# Patient Record
Sex: Male | Born: 1940
Health system: Southern US, Community
[De-identification: ages and names within clinical notes are randomized; demographics above are authoritative.]

## PROBLEM LIST (undated history)

## (undated) DIAGNOSIS — G4730 Sleep apnea, unspecified: Secondary | ICD-10-CM

## (undated) DIAGNOSIS — I10 Essential (primary) hypertension: Secondary | ICD-10-CM

## (undated) DIAGNOSIS — K4020 Bilateral inguinal hernia, without obstruction or gangrene, not specified as recurrent: Secondary | ICD-10-CM

## (undated) DIAGNOSIS — Z87442 Personal history of urinary calculi: Secondary | ICD-10-CM

## (undated) DIAGNOSIS — N5089 Other specified disorders of the male genital organs: Secondary | ICD-10-CM

## (undated) DIAGNOSIS — E559 Vitamin D deficiency, unspecified: Secondary | ICD-10-CM

## (undated) DIAGNOSIS — T7840XA Allergy, unspecified, initial encounter: Secondary | ICD-10-CM

## (undated) DIAGNOSIS — E785 Hyperlipidemia, unspecified: Secondary | ICD-10-CM

## (undated) DIAGNOSIS — D649 Anemia, unspecified: Secondary | ICD-10-CM

## (undated) DIAGNOSIS — R079 Chest pain, unspecified: Secondary | ICD-10-CM

## (undated) DIAGNOSIS — E079 Disorder of thyroid, unspecified: Secondary | ICD-10-CM

## (undated) DIAGNOSIS — J309 Allergic rhinitis, unspecified: Secondary | ICD-10-CM

## (undated) DIAGNOSIS — N189 Chronic kidney disease, unspecified: Secondary | ICD-10-CM

## (undated) DIAGNOSIS — E349 Endocrine disorder, unspecified: Secondary | ICD-10-CM

## (undated) DIAGNOSIS — L723 Sebaceous cyst: Secondary | ICD-10-CM

## (undated) DIAGNOSIS — E291 Testicular hypofunction: Secondary | ICD-10-CM

## (undated) DIAGNOSIS — E039 Hypothyroidism, unspecified: Secondary | ICD-10-CM

## (undated) DIAGNOSIS — N200 Calculus of kidney: Secondary | ICD-10-CM

## (undated) DIAGNOSIS — K635 Polyp of colon: Secondary | ICD-10-CM

## (undated) DIAGNOSIS — K602 Anal fissure, unspecified: Secondary | ICD-10-CM

## (undated) DIAGNOSIS — K219 Gastro-esophageal reflux disease without esophagitis: Secondary | ICD-10-CM

## (undated) HISTORY — DX: Disorder of thyroid, unspecified: E07.9

## (undated) HISTORY — DX: Essential (primary) hypertension: I10

## (undated) HISTORY — DX: Sebaceous cyst: L72.3

## (undated) HISTORY — DX: Vitamin D deficiency, unspecified: E55.9

## (undated) HISTORY — DX: Bilateral inguinal hernia, without obstruction or gangrene, not specified as recurrent: K40.20

## (undated) HISTORY — PX: FIBULA FRACTURE SURGERY: SHX947

## (undated) HISTORY — DX: Anal fissure, unspecified: K60.2

## (undated) HISTORY — DX: Other specified disorders of the male genital organs: N50.89

## (undated) HISTORY — DX: Chest pain, unspecified: R07.9

## (undated) HISTORY — DX: Polyp of colon: K63.5

## (undated) HISTORY — DX: Testicular hypofunction: E29.1

## (undated) HISTORY — PX: COLONOSCOPY: SHX174

## (undated) HISTORY — PX: POLYPECTOMY: SHX149

## (undated) HISTORY — PX: FRACTURE SURGERY: SHX138

## (undated) HISTORY — DX: Anemia, unspecified: D64.9

## (undated) HISTORY — DX: Hyperlipidemia, unspecified: E78.5

## (undated) HISTORY — DX: Allergy, unspecified, initial encounter: T78.40XA

## (undated) HISTORY — PX: ANAL FISSURE REPAIR: SHX2312

## (undated) HISTORY — DX: Allergic rhinitis, unspecified: J30.9

## (undated) HISTORY — PX: HEMORRHOID SURGERY: SHX153

## (undated) HISTORY — PX: UPPER GASTROINTESTINAL ENDOSCOPY: SHX188

## (undated) HISTORY — PX: CHOLECYSTECTOMY: SHX55

## (undated) HISTORY — DX: Gastro-esophageal reflux disease without esophagitis: K21.9

## (undated) HISTORY — DX: Calculus of kidney: N20.0

## (undated) HISTORY — PX: OTHER SURGICAL HISTORY: SHX169

---

## 1999-10-19 ENCOUNTER — Encounter (INDEPENDENT_AMBULATORY_CARE_PROVIDER_SITE_OTHER): Payer: Self-pay | Admitting: Specialist

## 1999-10-19 ENCOUNTER — Other Ambulatory Visit: Admission: RE | Admit: 1999-10-19 | Discharge: 1999-10-19 | Payer: Self-pay | Admitting: Urology

## 2002-05-11 ENCOUNTER — Ambulatory Visit (HOSPITAL_BASED_OUTPATIENT_CLINIC_OR_DEPARTMENT_OTHER): Admission: RE | Admit: 2002-05-11 | Discharge: 2002-05-11 | Payer: Self-pay | Admitting: Urology

## 2002-05-11 ENCOUNTER — Encounter (INDEPENDENT_AMBULATORY_CARE_PROVIDER_SITE_OTHER): Payer: Self-pay

## 2003-03-28 ENCOUNTER — Encounter: Payer: Self-pay | Admitting: Gastroenterology

## 2003-05-04 ENCOUNTER — Encounter: Payer: Self-pay | Admitting: Gastroenterology

## 2003-12-23 ENCOUNTER — Encounter (INDEPENDENT_AMBULATORY_CARE_PROVIDER_SITE_OTHER): Payer: Self-pay | Admitting: Specialist

## 2003-12-23 ENCOUNTER — Encounter (INDEPENDENT_AMBULATORY_CARE_PROVIDER_SITE_OTHER): Payer: Self-pay | Admitting: *Deleted

## 2003-12-23 ENCOUNTER — Ambulatory Visit (HOSPITAL_COMMUNITY): Admission: RE | Admit: 2003-12-23 | Discharge: 2003-12-23 | Payer: Self-pay | Admitting: General Surgery

## 2005-04-26 ENCOUNTER — Encounter: Admission: RE | Admit: 2005-04-26 | Discharge: 2005-04-26 | Payer: Self-pay | Admitting: Internal Medicine

## 2005-06-23 ENCOUNTER — Emergency Department (HOSPITAL_COMMUNITY): Admission: EM | Admit: 2005-06-23 | Discharge: 2005-06-23 | Payer: Self-pay | Admitting: Emergency Medicine

## 2005-07-02 ENCOUNTER — Inpatient Hospital Stay (HOSPITAL_COMMUNITY): Admission: RE | Admit: 2005-07-02 | Discharge: 2005-07-04 | Payer: Self-pay | Admitting: Orthopedic Surgery

## 2005-10-08 ENCOUNTER — Inpatient Hospital Stay (HOSPITAL_COMMUNITY): Admission: RE | Admit: 2005-10-08 | Discharge: 2005-10-11 | Payer: Self-pay | Admitting: Orthopedic Surgery

## 2005-10-09 ENCOUNTER — Encounter: Payer: Self-pay | Admitting: Vascular Surgery

## 2006-04-23 ENCOUNTER — Encounter: Admission: RE | Admit: 2006-04-23 | Discharge: 2006-04-23 | Payer: Self-pay | Admitting: Sports Medicine

## 2007-04-29 ENCOUNTER — Encounter (INDEPENDENT_AMBULATORY_CARE_PROVIDER_SITE_OTHER): Payer: Self-pay | Admitting: *Deleted

## 2007-04-29 ENCOUNTER — Ambulatory Visit (HOSPITAL_BASED_OUTPATIENT_CLINIC_OR_DEPARTMENT_OTHER): Admission: RE | Admit: 2007-04-29 | Discharge: 2007-04-29 | Payer: Self-pay | Admitting: General Surgery

## 2009-05-19 ENCOUNTER — Encounter (INDEPENDENT_AMBULATORY_CARE_PROVIDER_SITE_OTHER): Payer: Self-pay | Admitting: *Deleted

## 2009-06-21 ENCOUNTER — Encounter (INDEPENDENT_AMBULATORY_CARE_PROVIDER_SITE_OTHER): Payer: Self-pay | Admitting: *Deleted

## 2009-06-22 ENCOUNTER — Ambulatory Visit: Payer: Self-pay | Admitting: Gastroenterology

## 2009-07-06 ENCOUNTER — Ambulatory Visit: Payer: Self-pay | Admitting: Gastroenterology

## 2010-01-03 ENCOUNTER — Encounter: Payer: Self-pay | Admitting: Gastroenterology

## 2010-01-10 ENCOUNTER — Telehealth: Payer: Self-pay | Admitting: Gastroenterology

## 2010-02-05 ENCOUNTER — Encounter: Payer: Self-pay | Admitting: Gastroenterology

## 2010-03-12 ENCOUNTER — Encounter
Admission: RE | Admit: 2010-03-12 | Discharge: 2010-03-12 | Payer: Self-pay | Source: Home / Self Care | Attending: Internal Medicine | Admitting: Internal Medicine

## 2010-04-04 ENCOUNTER — Ambulatory Visit
Admission: RE | Admit: 2010-04-04 | Discharge: 2010-04-04 | Payer: Self-pay | Source: Home / Self Care | Attending: Gastroenterology | Admitting: Gastroenterology

## 2010-04-04 ENCOUNTER — Encounter: Payer: Self-pay | Admitting: Gastroenterology

## 2010-04-04 ENCOUNTER — Other Ambulatory Visit: Payer: Self-pay | Admitting: Gastroenterology

## 2010-04-04 DIAGNOSIS — D509 Iron deficiency anemia, unspecified: Secondary | ICD-10-CM | POA: Insufficient documentation

## 2010-04-04 DIAGNOSIS — K219 Gastro-esophageal reflux disease without esophagitis: Secondary | ICD-10-CM | POA: Insufficient documentation

## 2010-04-04 LAB — IGA: IgA: 266 mg/dL (ref 68–378)

## 2010-04-17 ENCOUNTER — Other Ambulatory Visit: Payer: Self-pay | Admitting: Gastroenterology

## 2010-04-17 ENCOUNTER — Ambulatory Visit
Admission: RE | Admit: 2010-04-17 | Discharge: 2010-04-17 | Payer: Self-pay | Source: Home / Self Care | Attending: Gastroenterology | Admitting: Gastroenterology

## 2010-04-17 DIAGNOSIS — K2980 Duodenitis without bleeding: Secondary | ICD-10-CM

## 2010-04-17 DIAGNOSIS — K2940 Chronic atrophic gastritis without bleeding: Secondary | ICD-10-CM

## 2010-04-24 ENCOUNTER — Encounter: Payer: Self-pay | Admitting: Gastroenterology

## 2010-04-24 NOTE — Procedures (Signed)
Summary: Colonoscopy  Patient: [name] Note: All result statuses are Final unless otherwise noted.  Tests: (1) Colonoscopy (COL)   COL Colonoscopy           DONE     [facility]     [address]     [place], [place]  [address]           COLONOSCOPY PROCEDURE REPORT           PATIENT:  [name], Douglas  MR#:  [id number]     BIRTHDATE:  [birth year], 68 yrs. old  GENDER:  male     ENDOSCOPIST:  Judie Petit T. Russella Dar, MD, [facility]           PROCEDURE DATE:  07/06/2009     PROCEDURE:  Higher-risk screening colonoscopy G0105     ASA CLASS:  Class II     INDICATIONS:  1) follow-up of polyp  2) surveillance and high-risk     screening, adenomatous polyp, 04/2003.     MEDICATIONS:   Fentanyl 50 mcg IV, Versed 5 mg IV     DESCRIPTION OF PROCEDURE:   After the risks benefits and     alternatives of the procedure were thoroughly explained, informed     consent was obtained.  Digital rectal exam was performed and     revealed no abnormalities.   The LB [id number]-[id number] endoscope     was introduced through the anus and advanced to the cecum, which     was identified by both the appendix and ileocecal valve, without     limitations.  The quality of the prep was excellent, using     MoviPrep.  The instrument was then slowly withdrawn as the colon     was fully examined.     <<PROCEDUREIMAGES>>           FINDINGS:  A normal appearing cecum, ileocecal valve, and     appendiceal orifice were identified. The ascending, hepatic     flexure, transverse, splenic flexure, descending, sigmoid colon,     and rectum appeared unremarkable. Retroflexed views in the rectum     revealed no abnormalities. The time to cecum =  2  minutes. The     scope was then withdrawn (time =  10  min) from the patient and     the procedure completed.     COMPLICATIONS:  None           ENDOSCOPIC IMPRESSION:     1) Normal colon           RECOMMENDATIONS:     1) Repeat Colonoscopy in 5 years.           Venita Lick. Russella Dar, MD, Clementeen Graham           CC: Rodrigo Ran, MD           n.     Rosalie DoctorVenita Lick. Clarita Mcelvain at 07/06/2009 10:55 AM           Douglas [name], [id number]  Note: An exclamation mark (!) indicates a result that was not dispersed into the flowsheet. Document Creation Date: 07/06/2009 10:56 AM _______________________________________________________________________  (1) Order result status: Final Collection or observation date-time: 07/06/2009 10:51 Requested date-time:  Receipt date-time:  Reported date-time:  Referring Physician:   Ordering Physician: Claudette Head [phone]) Specimen Source:  Source: Launa Grill Order Number: [id number] Lab site:   Appended Document: Colonoscopy    Clinical Lists Changes  Observations: Added new observation  of COLONNXTDUE: 06/2014 (07/06/2009 11:43)

## 2010-04-24 NOTE — Procedures (Signed)
Summary: Colonoscopy    Colonoscopy  Procedure date:  05/04/2003  Findings:      Results: Hemorrhoids.     Pathology:  Adenomatous polyp.        Location:  [facility].    Procedures Next Due Date:    Colonoscopy: 04/2008  Colonoscopy  Procedure date:  05/04/2003  Findings:      Results: Hemorrhoids.     Pathology:  Adenomatous polyp.        Location:  [facility].    Procedures Next Due Date:    Colonoscopy: 04/2008 Patient Name: [name], [name] MRN:  Procedure Procedures: Colonoscopy CPT: [phone].    with Hot Biopsy(s)CPT: [id number].  Personnel: Endoscopist: Venita Lick. Russella Dar, MD, Clementeen Graham.  Referred By: Rodrigo Ran, MD.  Exam Location: Exam performed in Outpatient Clinic. Outpatient  Patient Consent: Procedure, Alternatives, Risks and Benefits discussed, consent obtained, from patient. Consent was obtained by the RN.  Indications Symptoms: Hematochezia.  Increased Risk Screening: Family History of Polyps.  History  Current Medications: Patient is not currently taking Coumadin.  Pre-Exam Physical: Performed May 04, 2003. Entire physical exam was normal.  Exam Exam: Extent of exam reached: Cecum, extent intended: Cecum.  The cecum was identified by appendiceal orifice and IC valve. Colon retroflexion performed. ASA Classification: II. Tolerance: excellent.  Monitoring: Pulse and BP monitoring, Oximetry used. Supplemental O2 given.  Colon Prep Used Golytely for colon prep. Prep results: good.  Sedation Meds: Patient assessed and found to be appropriate for moderate (conscious) sedation. Fentanyl 75 mcg. given IV. Versed 7 mg. given IV.  Findings MULTIPLE POLYPS: Descending Colon. minimum size 3 mm, maximum size 4 mm. Procedure:  hot biopsy, removed, Polyp retrieved, 2 polyps Polyps sent to pathology. ICD9: Colon Polyps: 211.3.  NORMAL EXAM: Cecum to Splenic Flexure.  NORMAL EXAM: Sigmoid Colon.  HEMORRHOIDS: Internal. Size: Small.  Not bleeding. Not thrombosed. ICD9: Hemorrhoids, Internal: 455.0.   Assessment  Diagnoses: 211.3: Colon Polyps.  455.0: Hemorrhoids, Internal.   Events  Unplanned Interventions: No intervention was required.  Unplanned Events: There were no complications. Plans  Post Exam Instructions: No aspirin or non-steroidal containing medications: 2 weeks.  Medication Plan: Await pathology. Continue current medications.  Patient Education: Patient given standard instructions for: Polyps. Hemorrhoids.  Disposition: After procedure patient sent to recovery. After recovery patient sent home.  Scheduling/Referral: Colonoscopy, to [facility] T. Russella Dar, MD, [facility], around May 03, 2008.    This report was created from the original endoscopy report, which was reviewed and signed by the above listed endoscopist.    cc: Rodrigo Ran, MD

## 2010-04-24 NOTE — Letter (Signed)
Summary: [facility] Instructions  [facility]  [address] [place], [place] [address]   Phone: [phone]  Fax: [phone]       Douglas [name]    November 16, 1940    MRN: [id number]        Procedure Day /Date:  Thursday 07/06/2009     Arrival Time: 9:30 am      Procedure Time: 10:30 am     Location of Procedure:                    _x _  [facility] (4th Floor)                        PREPARATION FOR COLONOSCOPY WITH MOVIPREP   Starting 5 days prior to your procedure Saturday 4/9 do not eat nuts, seeds, popcorn, corn, beans, peas,  salads, or any raw vegetables.  Do not take any fiber supplements (e.g. Metamucil, Citrucel, and Benefiber).  THE DAY BEFORE YOUR PROCEDURE         DATE: Wednesday 4/13  1.  Drink clear liquids the entire day-NO SOLID FOOD  2.  Do not drink anything colored red or purple.  Avoid juices with pulp.  No orange juice.  3.  Drink at least 64 oz. (8 glasses) of fluid/clear liquids during the day to prevent dehydration and help the prep work efficiently.  CLEAR LIQUIDS INCLUDE: Water Jello Ice Popsicles Tea (sugar ok, no milk/cream) Powdered fruit flavored drinks Coffee (sugar ok, no milk/cream) Gatorade Juice: apple, white grape, white cranberry  Lemonade Clear bullion, consomm, broth Carbonated beverages (any kind) Strained chicken noodle soup Hard Candy                             4.  In the morning, mix first dose of MoviPrep solution:    Empty 1 Pouch A and 1 Pouch B into the disposable container    Add lukewarm drinking water to the top line of the container. Mix to dissolve    Refrigerate (mixed solution should be used within 24 hrs)  5.  Begin drinking the prep at 5:00 p.m. The MoviPrep container is divided by 4 marks.   Every 15 minutes drink the solution down to the next mark (approximately 8 oz) until the full liter is complete.   6.  Follow completed prep with 16 oz of clear liquid of your choice  (Nothing red or purple).  Continue to drink clear liquids until bedtime.  7.  Before going to bed, mix second dose of MoviPrep solution:    Empty 1 Pouch A and 1 Pouch B into the disposable container    Add lukewarm drinking water to the top line of the container. Mix to dissolve    Refrigerate  THE DAY OF YOUR PROCEDURE      DATE: Thursday 4/14  Beginning at 5:30 am (5 hours before procedure):         1. Every 15 minutes, drink the solution down to the next mark (approx 8 oz) until the full liter is complete.  2. Follow completed prep with 16 oz. of clear liquid of your choice.    3. You may drink clear liquids until 8:30 am  (2 HOURS BEFORE PROCEDURE).   MEDICATION INSTRUCTIONS  Unless otherwise instructed, you should take regular prescription medications with a small sip of water   as early as possible the morning  of your procedure.        OTHER INSTRUCTIONS  You will need a responsible adult at least 68 years of age to accompany you and drive you home.   This person must remain in the waiting room during your procedure.  Wear loose fitting clothing that is easily removed.  Leave jewelry and other valuables at home.  However, you may wish to bring a book to read or  an iPod/MP3 player to listen to music as you wait for your procedure to start.  Remove all body piercing jewelry and leave at home.  Total time from sign-in until discharge is approximately 2-3 hours.  You should go home directly after your procedure and rest.  You can resume normal activities the  day after your procedure.  The day of your procedure you should not:   Drive   Make legal decisions   Operate machinery   Drink alcohol   Return to work  You will receive specific instructions about eating, activities and medications before you leave.    The above instructions have been reviewed and explained to me by   [name], RN______________________    I fully understand and can  verbalize these instructions _____________________________ Date _________

## 2010-04-24 NOTE — Op Note (Signed)
Summary: Surgical repair of chronic anal fissure  NAME:  Douglas [name], Douglas [name]               ACCOUNT NO.:  [id number]      MEDICAL RECORD NO.:  [id number]          PATIENT TYPE:  AMB      LOCATION:  [facility]                         FACILITY:  [facility]      PHYSICIAN:  Timothy E. Earlene Plater, M.D. DATE OF BIRTH:  [birth year]      DATE OF PROCEDURE:  04/29/2007   DATE OF DISCHARGE:                                  OPERATIVE REPORT      PREOPERATIVE DIAGNOSIS:  Chronic anal fissure.      POSTOPERATIVE DIAGNOSIS:  Chronic anal fissure.      PROCEDURE:  Surgical repair of chronic anal fissure.      SURGEON:  Timothy E. Earlene Plater, M.D.      ANESTHESIA:  General.      HISTORY:  Mr. Groeneveld has been a patient for some time.  I have seen   him off and on for both hemorrhoids and now fissure.  He has failed   conservative management and wishes to proceed with surgery as has been   carefully discussed.  He was seen, identified and a permanent sign.      DESCRIPTION OF PROCEDURE:  He was taken to the operating room, placed   supine, LMA anesthesia provided.  He was placed in lithotomy.  Perianal   area was inspected, prepped and draped in the usual fashion.  There was   some hemorrhoidal protrusion in the left posterior but mild.  There was   a posterior anal fissure and anal stenosis.  Marcaine 0.5% with   epinephrine was injected round about the anal orifice and massaged it   well.  A left posterior internal sphincterotomy accomplished with   percutaneous 15 blade.  This allowed for gentle dilatation of the anal   opening.  Anoscope was inserted.  No other pathology noted.  The fissure   was cauterized, and the procedure was complete.  Gelfoam gauze and dry   sterile dressing applied.  He tolerated it well.  Counts correct.  He   was removed to the recovery room in good condition.      Written and verbal instructions were given to him and his wife along   with Vicodin #36, and he will be followed as an  outpatient.               Timothy E. Earlene Plater, M.D.   Electronically Signed            TED/MEDQ  D:  04/29/2007  T:  04/30/2007  Job:  [id number]      cc:   Loraine Leriche A. Perini, M.D.   Fax: [phone]

## 2010-04-24 NOTE — Op Note (Signed)
Summary: Hemorrhoidectomy  NAME:  Douglas [name], Douglas [name]               ACCOUNT NO.:  [id number]   MEDICAL RECORD NO.:  [id number]          PATIENT TYPE:  AMB   LOCATION:  DAY                          FACILITY:  [facility]   PHYSICIAN:  Timothy E. Earlene Plater, M.D. DATE OF BIRTH:  1941/02/08   DATE OF PROCEDURE:  12/23/2003  DATE OF DISCHARGE:                                 OPERATIVE REPORT   PREOPERATIVE DIAGNOSIS:  Internal and external hemorrhoids.   POSTOPERATIVE DIAGNOSIS:  Internal and external hemorrhoids.   PROCEDURE:  Hemorrhoidectomy.   SURGEON:  Timothy E. Earlene Plater, M.D.   ANESTHESIA:  General.   Douglas [name] is a healthy 70 year old Caucasian male with persistent  hemorrhoidal protrusion and rectal bleeding, even after conservative  management in the office.  After careful consideration, he wishes to proceed  with surgery, as carefully described in the office.  He was seen,  identified, and evaluated by anesthesia.   Patient was taken to the operating room, placed supine.  He positioned  himself comfortably.  IV sedation given.  He was placed in the lithotomy  position.  Perianal area inspected, prepped and draped in the usual fashion.  A large prolapsing right posterior internal and external hemorrhoidal  complex was present.  As well, a posterior/left posterior internal  hemorrhoid was noted.  The other hemorrhoids had been successful treated in  the office.  The anus area was injected round and about with Marcaine,  epinephrine, and Wydase and massage in well.  The anoscope placed.  The  right posterior hemorrhoid grasped.  It was carefully dissected from the  underlying tissue.  The sphincter was observed, and the resulting wound was  closed with a 2-0 chromic suture to the anoderm.  The skin and underlying  varicosities were trimmed, and then the external portion was closed with a 3-  0 chromic.  Hemostasis was good.  A hemorrhoidal band ligator was used to  treat the  posterior/left posterior hemorrhoid.  There were no complications.  The patient tolerated it well.  Gelfoam gauze and a dry sterile dressing  applied.  All counts correct.  He was removed to the recovery room in good  condition.  Written and verbal instructions were given to him and his wife,  and he will be seen in the office.     TED/MEDQ  D:  12/23/2003  T:  12/23/2003  Job:  [id number]   cc:   Loraine Leriche A. Waynard Edwards, M.D.  [address]  [place]  [place] [address]  Fax: [phone]        SP Surgical Pathology - STATUS: Final             By: Threasa Beards  ,        Perform Date: 30Sep05 00:00  Ordered By: Earlene Plater MD , Sheppard Plumber          Ordered Date: 30Sep05 11:17  Facility: [facility]                              Department: CPATH  Service  Report Text  [facility]   [address] [place], [place] [address]   [phone]    REPORT OF SURGICAL PATHOLOGY    Case #: [id number]   Patient Name: Douglas [name], [name]   PID: [id number]   Pathologist: Havery Moros, MD   DOB/Age [birth year] (Age: 70) Gender: M   Date Taken: 12/23/2003   Date Received: 12/23/2003    FINAL DIAGNOSIS    ***MICROSCOPIC EXAMINATION AND DIAGNOSIS***    ANORECTAL REGION: HEMORRHOIDS    gdt   Date Reported: 12/26/2003 Havery Moros, MD   *** Electronically Signed Out By BNS ***    Clinical information   (ajc)    specimen(s) obtained   Hemorrhoids    Gross Description   Received in formalin is a 3.5 x 1.8 x 1.3 cm slightly rubbery   tan-red irregular portion of tissue covered by wrinkled pink   mucosa and wrinkled tan skin. Cut surface displays occasional   prominent blood filled vessel. Two representative sections are   submitted in one block. (SP:caf 12/23/03)    cf/

## 2010-04-24 NOTE — Progress Notes (Signed)
Summary: new anemia  Phone Note From Other Clinic   Caller: Malachi Bonds @ Dr Waynard Edwards [phone] page her overhead Call For: Dr Russella Dar Reason for Call: Schedule Patient Appt Summary of Call: New iron def anemia with negative hemature card. Initial call taken by: Leanor Kail [facility],  January 10, 2010 10:56 AM  Follow-up for Phone Call        patient is scheduled for 02/08/10 3;00 with Dr Russella Dar. Follow-up by: Darcey Nora RN, CGRN,  January 10, 2010 11:17 AM

## 2010-04-24 NOTE — Assessment & Plan Note (Signed)
Summary: Gastroenterology  [place]  MR#:  [id number] Page #  NAME:  Douglas [name], Douglas [name]   OFFICE NO:  [id number]  DATE:  03/28/03  DOB:  [birth year]  REFERRING PHYSICIAN:   Dr. Rodrigo Ran  REASON FOR REFERRAL:  Hematochezia, hemorrhoids, and gastroesophageal reflux disease  HISTORY OF PRESENT ILLNESS:  The patient is a very nice 70 year old white male referred through the courtesy of Dr. Rodrigo Ran.  He has had problems with recurrent hemorrhoids and is status post hemorrhoidal banding by Dr. Kendrick Ranch on several occasions.  Most recently was on March 11, 2003.  After the banding, he states the rectal bleeding generally subsides for a period of time and then it tends to recur.  He has had problems with heartburn and reflux, which is now controlled on a combination of Protonix and ranitidine.  He notes not dysphagia or odynophagia.  He had a flexible sigmoidoscopy done sometime several years ago, but he does not recall the exact date and does not recall any significant findings.  He has a brother who developed colon polyps at age 69.  He has had no weight loss, abdominal pain, or rectal pain.    PAST MEDICAL HISTORY:  Hyperlipidemia, history of kidney stones, hemorrhoids status post banding on several occasions, allergies, cyst removed from groin, status post tonsillectomy, gastroesophageal reflux disease, hyperlipidemia, history of diplopia.  CURRENT MEDICATIONS:  Ranitidine 150 mg q. day, Protonix 40 mg q. day, Zyrtec 10 mg q. day, Zocor 40 mg q. day, multivitamin q. day, fish oil 1200 mg q. day, fiber 500 mg q. day, enteric-coated aspirin 81 mg q. day.  ALLERGIES:  None known    SOCIAL HISTORY:  He is married with 2 children and is employed as a Associate Professor with [employer].  He denies tobacco product usage and drinks only a modest amount of alcohol on social occasions.  REVIEW OF SYSTEMS:  As per the handwritten form.  PHYSICAL EXAMINATION:  GENERAL:  No acute distress.  VITAL SIGNS:   Height 5 feet 9 inches.  Weight 170 pounds.  Blood pressure is 130/84.  Pulse 64 and regular.  HEENT:  Anicteric sclerae.  Oropharynx clear.  Chest clear to auscultation and percussion.  Cardiac:  Regular rate and rhythm without murmurs.  Abdomen is soft, nontender, nondistended, normoactive bowel sounds.  No palpable organomegaly, masses, or hernias.  Rectal examination deferred to time of colonoscopy.  Recent exam and anoscopy by Dr. Earlene Plater apparently revealed only hemorrhoids.  Extremities without clubbing, cyanosis or edema.  Neurologic:  Alert and oriented x3.  Grossly nonfocal.    ASSESSMENT AND PLAN:   1.  Intermittent hematochezia, first-degree relative with colon polyps.  Bleeding presumably from hemorrhoids but need to exclude colorectal neoplasms.  Risks, benefits, and alternatives to colonoscopy with possible biopsy, possible polypectomy, and possible destruction of internal hemorrhoids discussed with the patient and he consents to proceed.  This will be scheduled electively. 2.  Chronic gastroesophageal reflux disease.  Good control on current regimen.  Consider dropping ranitidine and maintaining Protonix alone.  Given standard instructions on all antireflux measures.  If his reflux symptoms persist over the next several years requiring proton pump inhibitor therapy on a regular basis, consider upper endoscopy in 3 to 5 years for Barrett's surveillance.      Venita Lick. Russella Dar, M.D., F.A.C.G.  [id number] cc:  Dr. Rodrigo Ran D:  03/28/03; T:  ; Job [id number]

## 2010-04-24 NOTE — Letter (Signed)
Summary: Previsit letter  [facility]  [address] [place], [place] [address]   Phone: [phone]  Fax: [phone]       05/19/2009 MRN: [id number]  Douglas [name] [address] [place], [place]  [address]  Dear Mr. ZAMBITO,  Welcome to the Gastroenterology Division at [facility].    You are scheduled to see a nurse for your pre-procedure visit on 06-22-09 at on the 3rd floor at [facility], [address]  We ask that you try to arrive at our office 15 minutes prior to your appointment time to allow for check-in.  Your nurse visit will consist of discussing your medical and surgical history, your immediate family medical history, and your medications.    Please bring a complete list of all your medications or, if you prefer, bring the medication bottles and we will list them.  We will need to be aware of both prescribed and over the counter drugs.  We will need to know exact dosage information as well.  If you are on blood thinners (Coumadin, Plavix, Aggrenox, Ticlid, etc.) please call our office today/prior to your appointment, as we need to consult with your physician about holding your medication.   Please be prepared to read and sign documents such as consent forms, a financial agreement, and acknowledgement forms.  If necessary, and with your consent, a friend or relative is welcome to sit-in on the nurse visit with you.  Please bring your insurance card so that we may make a copy of it.  If your insurance requires a referral to see a specialist, please bring your referral form from your primary care physician.  No co-pay is required for this nurse visit.     If you cannot keep your appointment, please call [phone] to cancel or reschedule prior to your appointment date.  This allows [place] the opportunity to schedule an appointment for another patient in need of care.    Thank you for choosing [facility] for your medical needs.  We  appreciate the opportunity to care for you.  Please visit [place] at our website  to learn more about our practice.                     Sincerely.                                                                                                                   The Gastroenterology Division

## 2010-04-24 NOTE — Miscellaneous (Signed)
Summary: recall colon--ch.  Clinical Lists Changes  Medications: Added new medication of MOVIPREP 100 GM  SOLR (PEG-KCL-NACL-NASULF-NA ASC-C) As directed - Signed Rx of MOVIPREP 100 GM  SOLR (PEG-KCL-NACL-NASULF-NA ASC-C) As directed;  #1 x 0;  Signed;  Entered by: [name] RN;  Authorized by: Meryl Dare MD Clementeen Graham;  Method used: Electronically to [facility] [address], [place], [place]  [id number], Ph: [phone], Fax: [phone] Observations: Added new observation of ALLERGY REV: Done (06/22/2009 8:57)    Prescriptions: MOVIPREP 100 GM  SOLR (PEG-KCL-NACL-NASULF-NA ASC-C) As directed  #1 x 0   Entered by:   [name] RN   Authorized by:   Meryl Dare MD [facility]   Signed by:   [name] RN on 06/22/2009   Method used:   Electronically to        [facility]* (retail)       [address]       [place], [place]  [id number]       Ph: [phone]       Fax: [phone]   RxID:   [id number]

## 2010-04-26 NOTE — Letter (Signed)
Summary: EGD Instructions  [facility]  [address] [place], [place] [address]   Phone: [phone]  Fax: [phone]       DAEQUAN KOZMA    1940-05-03    MRN: [id number]       Procedure Day /Date: Tuesday January 24th, 2012     Arrival Time: 3:00pm     Procedure Time: 4:00pm     Location of Procedure:                    _ x _ [facility] (4th Floor)    PREPARATION FOR ENDOSCOPY   On 04/17/10 THE DAY OF THE PROCEDURE:  1.   No solid foods, milk or milk products are allowed after midnight the night before your procedure.  2.   Do not drink anything colored red or purple.  Avoid juices with pulp.  No orange juice.  3.  You may drink clear liquids until 2:00pm, which is 2 hours before your procedure.                                                                                                CLEAR LIQUIDS INCLUDE: Water Jello Ice Popsicles Tea (sugar ok, no milk/cream) Powdered fruit flavored drinks Coffee (sugar ok, no milk/cream) Gatorade Juice: apple, white grape, white cranberry  Lemonade Clear bullion, consomm, broth Carbonated beverages (any kind) Strained chicken noodle soup Hard Candy   MEDICATION INSTRUCTIONS  Unless otherwise instructed, you should take regular prescription medications with a small sip of water as early as possible the morning of your procedure.        OTHER INSTRUCTIONS  You will need a responsible adult at least 68 years of age to accompany you and drive you home.   This person must remain in the waiting room during your procedure.  Wear loose fitting clothing that is easily removed.  Leave jewelry and other valuables at home.  However, you may wish to bring a book to read or an iPod/MP3 player to listen to music as you wait for your procedure to start.  Remove all body piercing jewelry and leave at home.  Total time from sign-in until discharge is approximately 2-3 hours.  You should go home  directly after your procedure and rest.  You can resume normal activities the day after your procedure.  The day of your procedure you should not:   Drive   Make legal decisions   Operate machinery   Drink alcohol   Return to work  You will receive specific instructions about eating, activities and medications before you leave.    The above instructions have been reviewed and explained to me by   _______________________    I fully understand and can verbalize these instructions _____________________________ Date _________

## 2010-04-26 NOTE — Procedures (Addendum)
Summary: Upper Endoscopy  Patient: Douglas [name] Note: All result statuses are Final unless otherwise noted.  Tests: (1) Upper Endoscopy (EGD)   EGD Upper Endoscopy       DONE     [facility]     [address]     [place], [place]  [address]           ENDOSCOPY PROCEDURE REPORT           PATIENT:  Douglas [name], Douglas [name]  MR#:  [id number]     BIRTHDATE:  1940/10/20, 69 yrs. old  GENDER:  male     ENDOSCOPIST:  Judie Petit T. Russella Dar, MD, [facility]           PROCEDURE DATE:  04/17/2010     PROCEDURE:  EGD with biopsy, 43239     ASA CLASS:  Class II     INDICATIONS:  iron deficiency anemia, GERD     MEDICATIONS:  Fentanyl 75 mcg IV, Versed 7 mg IV     TOPICAL ANESTHETIC:  Exactacain Spray     DESCRIPTION OF PROCEDURE:   After the risks benefits and     alternatives of the procedure were thoroughly explained, informed     consent was obtained.  The [facility] [id number] endoscope was     introduced through the mouth and advanced to the second portion of     the duodenum, without limitations.  The instrument was slowly     withdrawn as the mucosa was fully examined.     <<PROCEDUREIMAGES>>     The esophagus and gastroesophageal junction were completely normal     in appearance. Mild gastritis vs gastropathy was found in the body     and the antrum of the stomach. It was erythematous. Biopsies of     the antrum and body of the stomach were obtained and sent to     pathology.  Otherwise normal stomach.  The duodenal bulb was     normal in appearance, as was the postbulbar duodenum. Random     biopsies were obtained and sent to pathology. R/O celiac disease.     Retroflexed views revealed a hiatal hernia,  4 cm. No Cameron     erosions noted.  The scope was then withdrawn from the patient and     the procedure completed.           COMPLICATIONS:  None           ENDOSCOPIC IMPRESSION:     1) Mild gastritis     2) Hiatal hernia           RECOMMENDATIONS:     1) Await pathology results  2) Antireflux measures     3) PPI qam           Hjalmar Ballengee T. Russella Dar, MD, [facility]           CC:  Rodrigo Ran, MD           n.     Rosalie DoctorVenita Lick. Rosell Khouri at 04/17/2010 03:53 PM           Annitta Jersey, [id number]  Note: An exclamation mark (!) indicates a result that was not dispersed into the flowsheet. Document Creation Date: 04/17/2010 3:54 PM _______________________________________________________________________  (1) Order result status: Final Collection or observation date-time: 04/17/2010 15:48 Requested date-time:  Receipt date-time:  Reported date-time:  Referring Physician:   Ordering Physician: Claudette Head [phone]) Specimen Source:  Source: Launa Grill Order Number: [id number] Lab  site:

## 2010-04-26 NOTE — Letter (Signed)
Summary: Rodrigo Ran MD/[facility]  Rodrigo Ran MD/[facility]   Imported By: Lester Fenton 04/09/2010 10:24:28  _____________________________________________________________________  External Attachment:    Type:   Image     Comment:   External Document

## 2010-04-26 NOTE — Assessment & Plan Note (Signed)
Summary: iron def anemia/sheri   History of Present Illness Visit Type: follow up Primary GI MD: Elie Goody MD [facility] Primary Provider: Rodrigo Ran MD Requesting Provider: Rodrigo Ran MD Chief Complaint: iron def anemia History of Present Illness:   This is a 70 year old white male with recently diagnosed iron deficiency anemia. He has no gastrointestinal complaints. Hemoglobin=11.6, MCV=76, ferritin=12.3 and iron saturation=7%. He underwent colonoscopy in April 2011 which was normal. He states he's donated aproximtely 4 pints of blood over the past 2 years. He was found to have Hemoccult-negative stool on examination by Dr. Waynard Edwards.  He has chronic reflux that is well controlled on Nexium.  He states he was recently diagnosed with osteoporosis.   GI Review of Systems      Denies abdominal pain, acid reflux, belching, bloating, chest pain, dysphagia with liquids, dysphagia with solids, heartburn, loss of appetite, nausea, vomiting, vomiting blood, weight loss, and  weight gain.        Denies anal fissure, black tarry stools, change in bowel habit, constipation, diarrhea, diverticulosis, fecal incontinence, heme positive stool, hemorrhoids, irritable bowel syndrome, jaundice, light color stool, liver problems, rectal bleeding, and  rectal pain.   Current Medications (verified): 1)  Zyrtec Allergy 10 Mg Tabs (Cetirizine Hcl) .... One Tablet By Mouth Once Daily 2)  Flonase 50 Mcg/act Susp (Fluticasone Propionate) .... 2 Sprays Each Nostril Once Daily As Needed 3)  Nexium 40 Mg Cpdr (Esomeprazole Magnesium) .... One Capsule By Mouth Once Daily 4)  Zocor 40 Mg Tabs (Simvastatin) .... One Tablet By Mouth Once Daily 5)  Levothroid 112 Mcg Tabs (Levothyroxine Sodium) .... One Tablet By Mouth Once Daily 6)  Meclizine Hcl 25 Mg Tabs (Meclizine Hcl) .... One Tablet By Mouth Once Daily As Needed  Allergies (verified): No Known Drug Allergies  Past History:  Past Medical  History: GERD Hemorrhoids Hyperlipidemia Kidney Stones Allergic rhinitis Diplopia Adenomatous Colon Polyps 04/2003 Scrotal mass Hypothyroidism Sleep apnea/ CPAP Ulnar neuropathy  Iron deficiency anemia Osteoporosis  Past Surgical History: Tonsillectomy Hemorrhoidectomy Anal fissure repair with internal sphincterectomy Leg Surgery   Family History: Family History of Colon Polyps:Brother at 22. Family History of Heart Disease: Father Family History of Diabetes: Mother No FH of Colon Cancer:  Social History: Reviewed history from 02/05/2010 and no changes required. Married Alcohol Use - yes Illicit Drug Use - no Patient is a former smoker. Quit in 1999 Daily Caffeine Use 3 daily  Review of Systems       The pertinent positives and negatives are noted as above and in the HPI. All other ROS were reviewed and were negative.   Vital Signs:  Patient profile:   70 year old male Height:      69 inches Weight:      180.8 pounds BMI:     26.80 Pulse rate:   62 / minute Pulse rhythm:   regular BP sitting:   126 / 62  (left arm)  Vitals Entered By: Chales Abrahams CMA Duncan Dull) (April 04, 2010 9:35 AM)  Physical Exam  General:  Well developed, well nourished, no acute distress. Head:  Normocephalic and atraumatic. Eyes:  PERRLA, no icterus. Ears:  Normal auditory acuity. Mouth:  No deformity or lesions, dentition normal. Neck:  Supple; no masses or thyromegaly. Lungs:  Clear throughout to auscultation. Heart:  Regular rate and rhythm; no murmurs, rubs,  or bruits. Abdomen:  Soft, nontender and nondistended. No masses, hepatosplenomegaly or hernias noted. Normal bowel sounds. Msk:  Symmetrical with no gross  deformities. Normal posture. Pulses:  Normal pulses noted. Extremities:  No clubbing, cyanosis, edema or deformities noted. Neurologic:  Alert and  oriented x4;  grossly normal neurologically. Cervical Nodes:  No significant cervical adenopathy. Inguinal Nodes:  No  significant inguinal adenopathy. Psych:  Alert and cooperative. Normal mood and affect.  Impression & Recommendations:  Problem # 1:  ANEMIA, IRON DEFICIENCY (ICD-280.9) Iron deficiency anemia. Rule out celiac disease accounting for poor iron and calcium absorption. Rule out Barrett's esophagus and erosive esophagitis in setting chronic GERD. The risks, benefits and alternatives to endoscopy with possible biopsy and possible dilation were discussed with the patient and they consent to proceed. The procedure will be scheduled electively. Continue iron replacement and monitoring per Dr. Waynard Edwards. Orders: TLB-IgA (Immunoglobulin A) (82784-IGA) T-Sprue Panel (Celiac Disease Aby Eval) (83516x3/[address]) EGD (EGD)  Problem # 2:  GERD (ICD-530.81) See above. Orders: EGD (EGD)  Patient Instructions: 1)  Please go directly to the basement to have your labs drawn.  2)  Upper Endoscopy brochure given.  3)  Copy sent to : Rodrigo Ran, MD 4)  The medication list was reviewed and reconciled.  All changed / newly prescribed medications were explained.  A complete medication list was provided to the patient / caregiver.

## 2010-05-02 NOTE — Letter (Signed)
Summary: Patient Notice-Endo Biopsy Results  [facility]  [address] [place], [place] [address]   Phone: [phone]  Fax: [phone]        April 24, 2010 MRN: [id number]    Douglas [name] [address] [place], [place]  [address]    Dear Mr. DELAY,  I am pleased to inform you that the biopsies taken during your recent endoscopic examination did not show any evidence of cancer upon pathologic examination. The biopsies showed gastritis and duodenitis.  Continue with the treatment plan as outlined on the day of your      exam.  Please call us if you are having persistent problems or have questions about your condition that have not been fully answered at this time.  Sincerely,  Meryl Dare MD [facility]  This letter has been electronically signed by your physician.  Appended Document: Patient Notice-Endo Biopsy Results LETTER MAILED

## 2010-06-15 ENCOUNTER — Emergency Department (HOSPITAL_COMMUNITY): Payer: Medicare Other

## 2010-06-15 ENCOUNTER — Inpatient Hospital Stay (HOSPITAL_COMMUNITY)
Admission: EM | Admit: 2010-06-15 | Discharge: 2010-06-19 | DRG: 419 | Disposition: A | Discharge status: Home Health | Payer: Medicare Other | Attending: Internal Medicine | Admitting: Internal Medicine

## 2010-06-15 DIAGNOSIS — K8070 Calculus of gallbladder and bile duct without cholecystitis without obstruction: Principal | ICD-10-CM | POA: Diagnosis present

## 2010-06-15 DIAGNOSIS — K219 Gastro-esophageal reflux disease without esophagitis: Secondary | ICD-10-CM | POA: Diagnosis present

## 2010-06-15 DIAGNOSIS — R945 Abnormal results of liver function studies: Secondary | ICD-10-CM

## 2010-06-15 DIAGNOSIS — E785 Hyperlipidemia, unspecified: Secondary | ICD-10-CM | POA: Diagnosis present

## 2010-06-15 DIAGNOSIS — R932 Abnormal findings on diagnostic imaging of liver and biliary tract: Secondary | ICD-10-CM

## 2010-06-15 DIAGNOSIS — G4733 Obstructive sleep apnea (adult) (pediatric): Secondary | ICD-10-CM | POA: Diagnosis present

## 2010-06-15 DIAGNOSIS — E039 Hypothyroidism, unspecified: Secondary | ICD-10-CM | POA: Diagnosis present

## 2010-06-15 LAB — DIFFERENTIAL
Basophils Absolute: 0 10*3/uL (ref 0.0–0.1)
Basophils Relative: 0 % (ref 0–1)
Eosinophils Relative: 1 % (ref 0–5)
Lymphocytes Relative: 12 % (ref 12–46)
Lymphs Abs: 1.2 10*3/uL (ref 0.7–4.0)
Neutro Abs: 7.8 10*3/uL — ABNORMAL HIGH (ref 1.7–7.7)

## 2010-06-15 LAB — CBC
MCV: 89.3 fL (ref 78.0–100.0)
RDW: 13.2 % (ref 11.5–15.5)

## 2010-06-15 LAB — LIPASE, BLOOD: Lipase: 40 U/L (ref 11–59)

## 2010-06-15 LAB — URINALYSIS, ROUTINE W REFLEX MICROSCOPIC
Glucose, UA: NEGATIVE mg/dL
Hgb urine dipstick: NEGATIVE
Nitrite: NEGATIVE
Protein, ur: NEGATIVE mg/dL
Specific Gravity, Urine: 1.015 (ref 1.005–1.030)
pH: 7 (ref 5.0–8.0)

## 2010-06-15 LAB — COMPREHENSIVE METABOLIC PANEL
AST: 101 U/L — ABNORMAL HIGH (ref 0–37)
Albumin: 3.6 g/dL (ref 3.5–5.2)
BUN: 13 mg/dL (ref 6–23)
CO2: 30 mEq/L (ref 19–32)
GFR calc Af Amer: >60 mL/min (ref >60)
GFR calc non Af Amer: 58 mL/min — ABNORMAL LOW (ref >60)
Glucose, Bld: 149 mg/dL — ABNORMAL HIGH (ref 70–99)
Potassium: 4.2 mEq/L (ref 3.5–5.1)
Sodium: 139 mEq/L (ref 135–145)
Total Bilirubin: 0.7 mg/dL (ref 0.3–1.2)

## 2010-06-15 MED ORDER — IOHEXOL 300 MG/ML  SOLN
100.0000 mL | Freq: Once | INTRAMUSCULAR | Status: AC | PRN
  Administered 2010-06-15: 100 mL via INTRAVENOUS

## 2010-06-16 ENCOUNTER — Inpatient Hospital Stay (HOSPITAL_COMMUNITY): Payer: Medicare Other

## 2010-06-16 ENCOUNTER — Encounter: Payer: Self-pay | Admitting: Gastroenterology

## 2010-06-16 LAB — CARDIAC PANEL(CRET KIN+CKTOT+MB+TROPI): Relative Index: INVALID (ref 0.0–2.5)

## 2010-06-17 LAB — COMPREHENSIVE METABOLIC PANEL
Alkaline Phosphatase: 81 U/L (ref 39–117)
BUN: 11 mg/dL (ref 6–23)
Chloride: 109 mEq/L (ref 96–112)
Creatinine, Ser: 1.19 mg/dL (ref 0.4–1.5)
Glucose, Bld: 96 mg/dL (ref 70–99)
Potassium: 3.7 mEq/L (ref 3.5–5.1)
Total Bilirubin: 0.7 mg/dL (ref 0.3–1.2)

## 2010-06-17 LAB — SURGICAL PCR SCREEN: MRSA, PCR: NEGATIVE

## 2010-06-17 LAB — CBC
Hemoglobin: 15.3 g/dL (ref 13.0–17.0)
MCH: 30.7 pg (ref 26.0–34.0)
MCHC: 33.9 g/dL (ref 30.0–36.0)
MCV: 90.4 fL (ref 78.0–100.0)
RBC: 4.99 MIL/uL (ref 4.22–5.81)
RDW: 13.7 % (ref 11.5–15.5)

## 2010-06-17 LAB — LIPASE, BLOOD: Lipase: 173 U/L — ABNORMAL HIGH (ref 11–59)

## 2010-06-18 ENCOUNTER — Inpatient Hospital Stay (HOSPITAL_COMMUNITY): Payer: Medicare Other

## 2010-06-18 ENCOUNTER — Other Ambulatory Visit: Payer: Self-pay | Admitting: General Surgery

## 2010-06-18 LAB — CBC
Platelets: 221 10*3/uL (ref 150–400)
RDW: 13.6 % (ref 11.5–15.5)
WBC: 7.7 10*3/uL (ref 4.0–10.5)

## 2010-06-18 LAB — COMPREHENSIVE METABOLIC PANEL
ALT: 148 U/L — ABNORMAL HIGH (ref 0–53)
AST: 55 U/L — ABNORMAL HIGH (ref 0–37)
Albumin: 3.2 g/dL — ABNORMAL LOW (ref 3.5–5.2)
Alkaline Phosphatase: 74 U/L (ref 39–117)
GFR calc Af Amer: >60 mL/min (ref >60)
Potassium: 3.7 mEq/L (ref 3.5–5.1)
Sodium: 140 mEq/L (ref 135–145)
Total Protein: 6.1 g/dL (ref 6.0–8.3)

## 2010-06-19 NOTE — H&P (Signed)
NAME:  Douglas [name], Douglas [name] NO.:  [id number]  MEDICAL RECORD NO.:  [id number]           PATIENT TYPE:  E  LOCATION:  [facility]                         FACILITY:  [facility]  PHYSICIAN:  Mauro Kaufmann, MD         DATE OF BIRTH:  [birth year]  DATE OF ADMISSION:  06/15/2010 DATE OF DISCHARGE:                             HISTORY & PHYSICAL   CHIEF COMPLAINT:  Chest pain.  HISTORY OF PRESENT ILLNESS:  A 70 year old male who developed chest pain this morning, which was located in substernal region and was burning in character with no radiation, no nausea or vomiting.  The patient also had the pain along the epigastric region as well as the both right and left upper quadrant regions.  The patient did not have any fevers.  No shortness of breath.  No cough.  In the ER, the patient was found to have elevated liver function tests and also had a CT of the abdomen and pelvis, which showed cholelithiasis as well as choledocholithiasis.  A GI consult has been called and triad hospitalist was called to admit the patient.  PAST MEDICAL HISTORY: 1. Significant for obstructive sleep apnea. 2. Hypothyroidism. 3. GERD. 4. Hyperlipidemia.  SOCIAL HISTORY:  The patient denies tobacco abuse.  No history of illicit drug abuse.  No alcohol abuse.  CURRENT MEDICATIONS:  The patient takes at home include, 1. Meclizine 25 mg p.o. daily as needed. 2. Levothyroxine 112 mcg p.o. every morning. 3. Zocor 40 mg p.o. daily. 4. Nexium 40 mg p.o. daily. 5. Flonase 50 mcg 2 sprays daily. 6. Zyrtec 10 mg p.o. daily.  ALLERGIES:  None.  REVIEW OF SYSTEMS:  HEENT:  There is no headache, no blurred vision.  No runny nose.  No sore throat.  NECK:  Positive history of hypothyroidism. CHEST:  Positive history of sleep apnea.  The patient uses CPAP at night.  HEART:  No history of CAD in the past.  GI:  As in the HPI. GENITOURINARY:  No dysuria, urgency, or frequency of urination. NEUROLOGIC:  No history of  stroke or TIA in the past.  PHYSICAL EXAMINATION:  VITAL SIGNS:  The patient's blood pressure 120/57, pulse 35, respiration is 18, temperature is 97.0. HEENT:  Head is atraumatic, normocephalic.  Eyes, extraocular muscles intact.  Oral mucosa is pink and moist. NECK:  Supple. CHEST:  Decreased breath sounds at the lung bases. HEART:  S1, S2 is regular in rate and rhythm.  No murmurs, rubs, or gallops. ABDOMEN:  Soft, nontender to palpation.  No hepatosplenomegaly. EXTREMITIES:  No cyanosis, no clubbing, no edema. NEUROLOGIC:  Cranial nerves II through XII are grossly intact.  Motor strength is 5/5 in all the extremities.  Sensations are intact and no focal deficit noted at this time.  IMAGING STUDIES: 1. Done in the hospital, the patient had a CT of the abdomen and     pelvis, which showed a 5-mm calculus within the distal common bile     duct at the level of ampulla. 2. Cholelithiasis without evidence of acute cholecystitis. 3. Bilateral nonobstructing small renal calculi and moderate-size  hiatal hernia.  PERTINENT LABORATORY DATA:  Lipase is 40.  Cardiac enzymes x1 negative. UA is negative.  WBC 9.9, hemoglobin 15.3, hematocrit 45.0, and platelets 228.  Sodium 139, potassium 4.2, chloride is 106, CO2 of 30, BUN 13, creatinine 1.24, and glucose 149.  AST is 101 and ALT of 59.  ASSESSMENT: 1. Choledocholithiasis. 2. Elevated liver function tests. 3. History of sleep apnea. 4. Hypothyroidism.  PLAN: 1. Elevated LFTs/cholelithiasis.  The patient has been seen by GI, Dr.     Christella Hartigan and is scheduled to go for ERCP in the morning for the     choledocholithiasis.  We will keep the patient n.p.o. after     midnight.  At this time, the patient will be started on the clear     liquid diet. 2. Hypothyroidism.  The patient will be continued on the levothyroxine     112 mcg p.o. daily. 3. Obstructive sleep apnea.  We will continue the patient on CPAP,     which he uses at  night. 4. Chest pain.  The patient's chest pain looks like secondary to the     GI issues, but still I am going to obtain 3 sets of cardiac enzymes     and we will monitor the patient on telemetry.  Because of the     decreased breath sounds on the lung bases, I am going to also     obtain a chest x-ray PA and lateral. 5. DVT prophylaxis with the SCDs at this time. 6. Hyperlipidemia.  I am going to hold the simvastatin at this time     because of the elevated liver function tests.     Mauro Kaufmann, MD     GL/MEDQ  D:  06/15/2010  T:  06/15/2010  Job:  [id number]  Electronically Signed by Mauro Kaufmann  on 06/19/2010 06:32:56 PM

## 2010-06-21 NOTE — Consult Note (Signed)
NAME:  ABDULAHAD, MEDEROS NO.:  [id number]  MEDICAL RECORD NO.:  [id number]           PATIENT TYPE:  I  LOCATION:  4738                         FACILITY:  [facility]  PHYSICIAN:  Gabrielle Dare. Janee Morn, M.D.DATE OF BIRTH:  [birth year]  DATE OF CONSULTATION: DATE OF DISCHARGE:                                CONSULTATION   CHIEF COMPLAINT:  Gallstones and common bile duct stone.  HISTORY OF PRESENT ILLNESS:  Jb Dulworth is a 70 year old white male who was admitted yesterday on June 15, 2010 with abdominal pain, workup included CT scan of the abdomen and pelvis showing gallstones and a stone located in the distal portion of his common bile duct.  The patient was admitted to the Medical Service and treated with IV antibiotics and bowel rest.  He underwent ERCP today by Dr. Christella Hartigan, which at that time revealed no common bile duct stone.  Sphincterotomy was done and bile duct was swept.  Currently, the patient was back on the floor after having his ERCP, which he tolerated well and he has no pain at this time and is eating lunch.  PAST MEDICAL HISTORY: 1. Obstructive sleep apnea. 2. Hyperlipidemia. 3. GERD. 4. Renal calculi.  PAST SURGICAL HISTORY:  Hemorrhoidectomy, anal fissure, ORIF of left ankle, scrotal mass removal, and tonsillectomy.  SOCIAL HISTORY:  Works as an Dealer.  Does not drink alcohol.  ALLERGIES:  No known drug allergies.  MEDICATIONS CURRENTLY IN THE HOSPITAL:  Unasyn.  REVIEW OF SYSTEMS:  HEENT:  Wears contacts and reading glasses. PULMONARY:  Has obstructive sleep apnea, wear a CPAP mask at night.  GU: Negative.  GI:  Chronic GERD and he recently went off his Protonix due to osteoporosis.  NEUROLOGIC:  Occasional headaches.  MUSCULOSKELETAL: Negative.  ENDOCRINE:  History of the elevated blood glucose.  Remainder of the system review was negative.  PHYSICAL EXAMINATION:  VITAL SIGNS:  Temperature 98.3, blood pressure 101/57, heart rate  57, respirations 20. GENERAL:  He is awake and alert. NECK:  Supple. LUNGS:  Clear to auscultation. HEART:  Regular with no murmurs. ABDOMEN:  Soft.  There is no tenderness.  There is no distention.  He has a tiny umbilical hernia, which reduces easily.  Bowel sounds are present.  No masses are felt. SKIN:  Warm and dry.  No rashes are present. EXTREMITIES:  Have no significant edema.  DATA REVIEWED:  CT scan findings as above in addition liver function tests on June 15, 2010 were AST 101, ALT 59, alkaline phosphatase 71, bilirubin 0.7.  Basic metabolic panel was unremarkable except for glucose of 149, white blood cell count was 9.9, hemoglobin of 15.3.  IMPRESSION: 1. Cholelithiasis. 2. Choledocholithiasis, now cleared. 3. Status post endoscopic retrograde cholangiopancreatography.  PLAN:  I have offered cholecystectomy this admission possibly on June 17, 2010.  We will check his laboratory studies in the morning including liver function tests, amylase, and lipase and we will see if the patient is doing in the morning and possibly proceed them.  I will discuss this patient's detail with my partner, Dr. Abbey Chatters, who is a working Advertising account executive.  Gabrielle Dare Janee Morn, M.D.     BET/MEDQ  D:  06/16/2010  T:  06/17/2010  Job:  [id number]  cc:   Rachael Fee, MD  Electronically Signed by Violeta Gelinas M.D. on 06/21/2010 01:42:17 PM

## 2010-06-21 NOTE — Procedures (Signed)
Summary: ERCP  Patient: Douglas [name] Note: All result statuses are Final unless otherwise noted.  Tests: (1) ERCP (ERC)   ERC ERCP                  DONE (C)     [facility]     [address]     [place], [place]  [address]          ERCP PROCEDURE REPORT          PATIENT:  Douglas [name], Douglas [name]  MR#:  [id number]     BIRTHDATE:  February 11, 1941  GENDER:  male     ENDOSCOPIST:  Rachael Fee, MD     PROCEDURE DATE:  06/16/2010     PROCEDURE:  ERCP with sphincterotomy     INDICATIONS:  stone in CBD on CT yesterday, stones in GB;     transient abd pain yesterday     MEDICATIONS:  Fentanyl 100 mcg IV, Versed 7 mg IV, glucagon 0.5 mg     IV, unasyn 3gm IV     TOPICAL ANESTHETIC:  Cetacaine Spray          DESCRIPTION OF PROCEDURE:   After the risks benefits and     alternatives of the procedure were thoroughly explained, informed     consent was obtained.  The [id number] ([id number]) endoscope was     introduced through the mouth and advanced to the second portion of     the duodenum without detailed examination of the UGI tract. The     major papilla was slightly 'hooded.'  A 44 Autotome over a .035     hydrawire was used to cannulate the CBD.  An identical wire was     temporarily placed into main pancreatic duct to facilitate biliary     cannulation but contrast was never injected into pancreas.     Cholangiogram showed non-dilated external bile ducts, limited     views of non-dilated intrahepatic ducts. The cystic duct was     patent and gallbladder partially opacified. There were no clear     filling defects in CBD but given the CT scan results yesterday     (stone in CBD) a biliary sphincterotomy was performed over the     biliary wire and then the bileduct was swept several times with     biliary sweeping balloon.  Bile was delivered into the duodenum.     There were no stones or pus.  Completion, occlusion cholangiogram     was normal.       <<PROCEDUREIMAGES>>          Impression:     Normal biliary tree.  Given CT findings showing CBD stone     yesterday, biliary sphincterotomy was performed and bile duct     swept several times.  Patent cystic duct.          I have already spoken with Violeta Gelinas, MD (from [facility]) who will be meeting with patient later today to     discuss cholecystectomy.          ______________________________     Rachael Fee, MD          n.     REVISED:  06/16/2010 11:14 AM     eSIGNED:   Rachael Fee at 06/16/2010 11:14 AM          Annitta Jersey, [id number]  Note: An exclamation mark Marland Kitchen)  indicates a result that was not dispersed into the flowsheet. Document Creation Date: 06/16/2010 11:15 AM _______________________________________________________________________  (1) Order result status: Final Collection or observation date-time: 06/16/2010 11:02 Requested date-time:  Receipt date-time:  Reported date-time:  Referring Physician:   Ordering Physician: Rob Bunting [phone]) Specimen Source:  Source: Launa Grill Order Number: [id number] Lab site:

## 2010-06-23 NOTE — Discharge Summary (Signed)
  NAME:  Douglas [name], Douglas [name] NO.:  [id number]  MEDICAL RECORD NO.:  [id number]           PATIENT TYPE:  I  LOCATION:  4738                         FACILITY:  [facility]  PHYSICIAN:  Tarry Kos, MD       DATE OF BIRTH:  05/04/1940  DATE OF ADMISSION:  06/15/2010 DATE OF DISCHARGE:  06/19/2010                              DISCHARGE SUMMARY   DISCHARGE DIAGNOSES:  Choledocholithiasis status post laparoscopic cholecystectomy and endoscopic retrograde cholangiopancreatography.  SUMMARY OF HOSPITAL COURSE:  Douglas [name] is a very pleasant 70 year old male who presented to the ED on March 23 with abdominal pain, was found to have elevated LFTs and alk phos and choledocholithiasis.  He had an ERCP which was performed by GI and then subsequently had a laparoscopic cholecystectomy by general surgeon, Dr. Lindie Spruce.  There were no stones found on his ERCP and he had a sphincterotomy.  He had his cholecystectomy yesterday.  He has been doing well.  He is tolerating diet.  His LFTs are trending down along with normalization of his alk phos and bilirubin levels.  He is going to be discharged home today to follow up with Surgery in several weeks and his PCP in 1 week.  He is being discharged home.  He has been afebrile.  Vital signs stable, tolerating cardiac diet, alert and oriented x4.  No apparent distress, cooperating fairly.  Cor, regular rate and rhythm without murmurs, rubs, or gallops. Chest is clear to auscultation bilaterally.  No wheezes, rhonchi, or rales.  Abdomen is soft, nondistended, appropriately tender around the laparoscopic incisions.  Positive bowel sounds.  No rebound. No guarding.  Extremities; no clubbing, cyanosis, or edema. Psychiatric; normal affect.  Neurologic; no focal neurologic deficits. Skin; no rashes.  Please see discharge med rec sheet for full details.  He is to resume his home medications and in addition, he has been given some pain medications to  last over the next couple of weeks.          ______________________________ Tarry Kos, MD     RD/MEDQ  D:  06/19/2010  T:  06/20/2010  Job:  [id number]  Electronically Signed by Eldridge Dace MD on 06/23/2010 11:31:56 AM

## 2010-06-27 NOTE — Op Note (Signed)
NAME:  Douglas [name], Douglas [name] NO.:  [id number]  MEDICAL RECORD NO.:  [id number]           PATIENT TYPE:  I  LOCATION:  4738                         FACILITY:  [facility]  PHYSICIAN:  Cherylynn Ridges, M.D.    DATE OF BIRTH:  07/14/1940  DATE OF PROCEDURE:  06/18/2010 DATE OF DISCHARGE:                              OPERATIVE REPORT   PREOPERATIVE DIAGNOSIS:  Cholelithiasis and choledocholithiasis, status post endoscopic retrograde cholangiopancreatography.  POSTOPERATIVE DIAGNOSIS:  Cholelithiasis and choledocholithiasis, status post endoscopic retrograde cholangiopancreatography.  PROCEDURE:  Laparoscopic cholecystectomy with cholangiogram.  SURGEON:  Cherylynn Ridges, MD  ASSISTANT:  Brayton El, PA-C  ANESTHESIA:  General endotracheal.  ESTIMATED BLOOD LOSS:  Less than 20 mL.  No complications.  CONDITION:  Stable.  FINDINGS:  Normal intraoperative cholangiogram with minimal inflammation of the gallbladder.  INDICATIONS FOR OPERATION:  The patient is a 71 year old who had been admitted with abnormal liver function tests, gallstones noted on ultrasound and the study also demonstrating common duct stones, he underwent a preoperative ERCP.  This did not show stone in the duct, however, sphincterotomy was performed.  He was taken to the operating room for laparoscopic cholecystectomy.  OPERATION:  The patient was taken to the operating room, placed on table in supine position.  After an adequate general endotracheal anesthetic was administered, he was prepped and draped in usual sterile manner exposing midline of the abdomen.  A supraumbilical midline incision was made using #15 blade and taken down to the midline fascia.  Once the fascia was identified, we nicked the fascia using #15 blade and then grabbed the edges with Kocher clamps.  We lifted up with Kocher clamps and then bluntly dissected down into the peritoneal cavity up to a maximal intra-abdominal  pressure of 50 mmHg.  This was done with a Hasson cannula which we secured in place with a pursestring suture put in place.  Subsequently, 2 right upper quadrant 5-mm cannulas and a subxiphoid 5-mm cannula were passed under direct vision with all those in place, the patient was placed in reverse Trendelenburg, the left-side was tilted down and the dissection begun.  We grabbed the gallbladder with a grasper through the lateral most cannula and retracted towards the anterior abdominal wall and the right upper quadrant, we placed a sac went on to the infundibulum.  We then dissected out the peritoneum overlying the triangle of Fallot and hepatic duodenal triangle.  Once that was done we isolated the cystic duct.  A clip was placed along the gallbladder side of cholecystodochotomy made with laparoscopic scissors.  Then we cannulated the cystic duct using a Cook catheter which had been passed through the anterior abdominal wall.  Cholangiogram showed good flow into the duodenum, no ductal dilatation.  No intraductal filling defects and good proximal flow.  Once cholangiogram was completed, the clip securing it in place was removed.  We removed the cannula, clipped the distal cystic duct x3, then transected the cystic duct.  The cystic artery was identified and clipped proximally and distally and transected.  We then dissected out the gallbladder from its bed with minimal difficulty, obtaining  hemostasis using electrocautery in the bed.  Once this was done, we removed the gallbladder from the supraumbilical site with minimal difficulty without an EndoCatch bag.  It was completely intact.  We aspirated and irrigated the bed and above and around the liver aspirated all fluid and gas from above the liver, removed all cannulas.  The supraumbilical site was closed using the pursestring suture which was in place.  We then closed the other cannula sites using Dermabond and Steri-Strips.  The  supraumbilical site was closed using running subcuticular stitch of 4-0 Monocryl.  Dermabond, Steri-Strips and Tegaderm were used to complete all dressings.  All counts were correct including needles, sponges and instruments.     Cherylynn Ridges, M.D.     JOW/MEDQ  D:  06/18/2010  T:  06/19/2010  Job:  [id number]  Electronically Signed by Jimmye Norman M.D. on 06/27/2010 05:25:22 PM

## 2010-08-07 NOTE — Op Note (Signed)
NAME:  Douglas [name], Douglas [name]               ACCOUNT NO.:  [id number]   MEDICAL RECORD NO.:  [id number]          PATIENT TYPE:  AMB   LOCATION:  NESC                         FACILITY:  [facility]   PHYSICIAN:  Timothy E. Earlene Plater, M.D. DATE OF BIRTH:  September 27, 1940   DATE OF PROCEDURE:  04/29/2007  DATE OF DISCHARGE:                               OPERATIVE REPORT   PREOPERATIVE DIAGNOSIS:  Chronic anal fissure.   POSTOPERATIVE DIAGNOSIS:  Chronic anal fissure.   PROCEDURE:  Surgical repair of chronic anal fissure.   SURGEON:  Timothy E. Earlene Plater, M.D.   ANESTHESIA:  General.   HISTORY:  [name] has been a patient for some time.  I have seen  him off and on for both hemorrhoids and now fissure.  He has failed  conservative management and wishes to proceed with surgery as has been  carefully discussed.  He was seen, identified and a permanent sign.   DESCRIPTION OF PROCEDURE:  He was taken to the operating room, placed  supine, LMA anesthesia provided.  He was placed in lithotomy.  Perianal  area was inspected, prepped and draped in the usual fashion.  There was  some hemorrhoidal protrusion in the left posterior but mild.  There was  a posterior anal fissure and anal stenosis.  Marcaine 0.5% with  epinephrine was injected round about the anal orifice and massaged it  well.  A left posterior internal sphincterotomy accomplished with  percutaneous 15 blade.  This allowed for gentle dilatation of the anal  opening.  Anoscope was inserted.  No other pathology noted.  The fissure  was cauterized, and the procedure was complete.  Gelfoam gauze and dry  sterile dressing applied.  He tolerated it well.  Counts correct.  He  was removed to the recovery room in good condition.   Written and verbal instructions were given to him and his wife along  with Vicodin #36, and he will be followed as an outpatient.      Timothy E. Earlene Plater, M.D.  Electronically Signed     TED/MEDQ  D:  04/29/2007  T:   04/30/2007  Job:  [id number]   cc:   Loraine Leriche A. Perini, M.D.  Fax: [phone]

## 2010-08-10 NOTE — Discharge Summary (Signed)
NAME:  Douglas [name], Douglas [name] NO.:  [id number]   MEDICAL RECORD NO.:  [id number]          PATIENT TYPE:  INP   LOCATION:  5006                         FACILITY:  [facility]   PHYSICIAN:  Doralee Albino. Carola Frost, M.D. DATE OF BIRTH:  03-31-1940   DATE OF ADMISSION:  10/08/2005  DATE OF DISCHARGE:  10/11/2005                                 DISCHARGE SUMMARY   MAIN DIAGNOSIS:  Status post open reduction and internal fixation, left  ankle and tibia pilon fracture with delayed wound healing and painful  retained hardware.   DISCHARGE DIAGNOSIS:  Same.   SECONDARY DIAGNOSES:  1. History of sleep apnea.  2. History of esophageal reflux.  3. History of tobacco use.   BRIEF HISTORY:  Patient is a 70 year old white male status post ORIF, left  ankle fracture and tibial shaft pilon fracture on July 02, 2005.  He had  delayed wound healing on the lateral wound with increased swelling,  drainage.  In the last 3 weeks, increased pain.  He has been on Duricef x3  weeks, no improvement in the wound and recommendation was to take him to the  operating room for I&D, removal of the lateral hardware, wound VAC, followed  by secondary wound closure.  Patient is explained risks, complications, and  he elects to proceed with this plan.   BRIEF HOSPITAL COURSE:  Patient was admitted on July 17, underwent I&D,  removal of hardware left ankle and application of wound VAC.  Tolerated this  procedure well under general anesthesia.  Cultures were sent off.  First day  postoperatively, patient was eating well, voiding well.  Wound VAC was in  place.  Neurovascularly intact.  No complaints.  Arrangements were made to  take him back to the operating room on July 19th to check the wound closure.  He was n.p.o. after midnight.  Permit was obtained.  He remained afebrile.  Vital signs stable throughout his hospital stay.  Wound cultures remained  negative.  On July 19, he went back to the operating room for  delayed wound  closure.  Tolerated this well under general anesthesia.  He had decreased  swelling and was comfortable postoperatively and ready for discharge home on  October 11, 2005.  Eating well, voiding well.  Vital signs stable.  Neurovascularly intact.  Dressing dry.   PERTINENT LABORATORY FINDINGS:  WBC 6.1, hemoglobin 14.4, hematocrit 42.9,  sodium 140, potassium 4.3.  All other chemistry indices can be obtained from  primary hospital record.  Cultures were negative for organisms.  Final  results can be obtained from primary hospital record.   DISCHARGE CONDITION:  Stable and improved.   DISCHARGE INSTRUCTIONS:  Discharged home.  Given prescription for Percocet.  He is to take 1 baby aspirin 81 mg p.o. daily.  Follow up with Dr. Carola Frost  next Wednesday.  Discontinue his IV and IV meds prior to discharge home.  Contact us prior to followup if there are any questions or concerns.      Aura Fey Bobbe Medico.      Doralee Albino. Carola Frost, M.D.  Electronically Signed  SCI/MEDQ  D:  11/05/2005  T:  11/05/2005  Job:  [id number]

## 2010-08-10 NOTE — Op Note (Signed)
   NAME:  Douglas [name], Douglas [name]                         ACCOUNT NO.:  [id number]   MEDICAL RECORD NO.:  [id number]                   PATIENT TYPE:  AMB   LOCATION:  NESC                                 FACILITY:  [facility]   PHYSICIAN:  Ronald L. Ovidio Hanger, M.D.           DATE OF BIRTH:  03-29-1940   DATE OF PROCEDURE:  05/11/2002  DATE OF DISCHARGE:                                 OPERATIVE REPORT   PREOPERATIVE DIAGNOSIS:  Left groin sebaceous cyst, previously infected.   PROCEDURE:  Excision of left groin sebaceous cyst.   SURGEON:  Ronald L. Earlene Plater, M.D.   ANESTHESIA:  General laryngeal airway.   ESTIMATED BLOOD LOSS:  20 mL.   TUBES:  None.   COMPLICATIONS:  None.   INDICATION FOR PROCEDURE:  The patient is a very nice 70 year old white male  who presented with an infected left groin sebaceous cyst.  He had  significant drainage and had to be drained in the office and packed, and he  subsequently healed and is no longer indurated.  After understanding the  risks, benefits, and alternatives, he has elected to proceed with excision  of the sac.   DESCRIPTION OF PROCEDURE:  The patient was placed in the supine position,  after proper general laryngeal airway anesthesia was prepped and draped with  Betadine in a sterile fashion.  An elliptical incision was made vertically  over the drainage site and incision was carried down circumferentially  around the sac.  The sac was dissected with blunt and sharp dissection  utilizing Metzenbaum scissors and submitted to pathology.  The base appeared  to be essentially a normal, well-vascularized tissue.  Good hemostasis was  obtained with Bovie coagulation and cautery.  Thorough irrigation was  performed with saline solution.  The base was closed with interrupted buried  3-0 chromic catgut and the skin was closed with interrupted 3-0 chromic  catgut.  The wound was dressed with collodion and a Tegaderm, and the  patient was taken to the  recovery room stable.  The specimen was submitted  to pathology.                                               Ronald L. Ovidio Hanger, M.D.    RLD/MEDQ  D:  05/11/2002  T:  05/11/2002  Job:  [id number]

## 2010-08-10 NOTE — Op Note (Signed)
NAMEMarland Kitchen  Douglas [name], LICHTMAN NO.:  [id number]   MEDICAL RECORD NO.:  [id number]          PATIENT TYPE:  INP   LOCATION:  5033                         FACILITY:  [facility]   PHYSICIAN:  Doralee Albino. Carola Frost, M.D. DATE OF BIRTH:  11/27/1940   DATE OF PROCEDURE:  07/02/2005  DATE OF DISCHARGE:                                 OPERATIVE REPORT   PREOPERATIVE DIAGNOSIS:  Left tibial shaft and left fibular fracture.   POSTOPERATIVE DIAGNOSIS:  Left tibial shaft and left fibular fracture.   PROCEDURE:  1.  Open reduction internal fixation of left tibial shaft fracture.  2.  Open reduction internal fixation of left lateral malleolus.   SURGEON:  Doralee Albino. Carola Frost, M.D.   ASSISTANCE:  Dr. Norlene Campbell.   SECOND ASSIST:  Oris Drone. Petrarca, P.A.-C.   ANESTHESIA:  General, Zenon Mayo, M.D. supplemented with femoral  block and popliteal block.   SPECIMENS:  None.   ESTIMATED BLOOD LOSS:  80 mL.   TOURNIQUET:  None.   COMPLICATIONS:  None.   DISPOSITION:  PACU.   CONDITION:  Stable.   INDICATIONS FOR PROCEDURE:  Douglas [name] is a very pleasant 70 year old male  who sustained a left tibia and fibular fracture from a ground-level fall  which was treated initially with splinting while waiting for soft tissue  swelling resolution.  He was initially seen, evaluated by Dr. Cleophas Dunker and  underwent preoperative workup as well through Dr. Hoy Register office. We did  discuss with him the risks and benefits of surgery including possibility of  infection, nerve injury, vessel injury, malunion, nonunion, need for further  surgery, symptomatic hardware, DVT and PE, among other perioperative  complications such as heart attack and stroke.  After full discussion of  risks and benefits the patient did wish to proceed.   DESCRIPTION OF PROCEDURE:  Douglas [name] was taken to operating room after  administration of a femoral and popliteal block in the preop holding area.  General  anesthesia was induced.  His left lower extremity was prepped,  draped usual sterile fashion.  Tourniquet was placed about the thigh, but  never inflated during the case. Standard lateral approach was made to the  fibula going somewhat posterior.  We exposed the fracture site after  identifying the superficial peroneal nerve proximally and retracting it  anteriorly. The fracture was completely displaced and shortened. It was  cleaned with curette and irrigation and it was then reduced with a lobster  claw and a six-hole one-third tubular plate applied after initially  attempting a lag screw which was essentially lateral to medial. Given the  medial comminution, however this was insufficient to hold the reduction and  consequently we had hold the reduction with a clamp until the plate was  completely applied. It was placed posteriorly as a buttress against  posterior displacement of the distal fragment.  AP lateral mortis images  showed acceptable reduction with restoration of length.  Attention was then  turned medially where given the comminution laterally and the possibility  that a completely anatomic reduction had now been performed, the incision  was  made to go ahead extend the medial incision somewhat more proximally to  facilitate reduction by direct exposure of the fracture site.  We incised  the retinaculum over the fracture and then left the periosteum intact except  the edge of the fracture line where it was teased back with a 15 blade so  that an anatomic reduction could be performed. First we cleaned out the  fracture site with curette and lavage and then used a pointed tenaculum to  reduce the fracture. Following this we then placed two anterior-posterior  lag screws perpendicular to the fracture line resulting in provisional  fixation.  AP lateral mortis images confirmed anatomic alignment with  excellent reduction.  We then applied a locked distal tibia plate given the  poor  bone quality encountered on the lateral side. Standard screws including  a distal cancellus screw were used to maximally oppose the plate to the bone  and then several locked screws were placed in the distal fragment as well as  the two standard cortical which were placed initially in the proximal  fragment and then two locked. The cancellus distal screw was then exchanged  for a locked screw. We did obtain multiple images to confirm that none of  our hardware penetrated the syndesmotic interval.  We also then performed a  stress view in the mortise projection to make sure the syndesmosis had not  been disrupted and it had not. Consequently no additional fixation was  deemed appropriate through the syndesmotic interval. The wound was copiously  irrigated and closed in standard layered fashion with 2-0 Vicryl and  staples.  Sterile gently compressive dressing posterior and stirrup splint  were then applied.  The patient was awakened from anesthesia and transported  to PACU in stable condition.   PROGNOSIS:  Douglas [name] should do well following this procedure given the  stability of his construct and the anatomic reduction was obtained.  He will  be non-weightbearing for the next 6 weeks with graduated weightbearing  thereafter. Given the stability we will transition him into a Cam boot and  allow range of motion as soon as his wound has completely healed. He will be  on Lovenox for DVT prophylaxis while in the hospital and then transitioned  to a baby aspirin at discharge.      Doralee Albino. Carola Frost, M.D.  Electronically Signed     MHH/MEDQ  D:  07/02/2005  T:  07/03/2005  Job:  [id number]

## 2010-08-10 NOTE — Op Note (Signed)
NAME:  Douglas, [name] NO.:  [id number]   MEDICAL RECORD NO.:  [id number]          PATIENT TYPE:  INP   LOCATION:  5006                         FACILITY:  [facility]   PHYSICIAN:  Doralee Albino. Carola Frost, M.D. DATE OF BIRTH:  Apr 19, 1940   DATE OF PROCEDURE:  10/08/2005  DATE OF DISCHARGE:                                 OPERATIVE REPORT   PREOPERATIVE DIAGNOSIS:  Nonhealing wound, left lateral ankle.   POSTOPERATIVE DIAGNOSIS:  Nonhealing wound, left lateral ankle.   PROCEDURES:  1. Irrigation and debridement of left leg wound, including the skin and      subcutaneous tissue, fascia and muscle.  2. Removal of deep implant, left fibula.  3. Application of Wound-Vac.   SURGEON:  Doralee Albino. Carola Frost, M.D.   ASSISTANT:  Hardin Negus, [facility]   ANESTHESIA:  General.   COMPLICATIONS:  None.   TOURNIQUET:  None.   SPECIMENS:  Two sent prior to antibiotics.   DRAINS:  One Wound Vac.   DISPOSITION:  To PACU.   CONDITION:  Stable.   BRIEF SUMMARY AND INDICATION FOR PROCEDURE:  Douglas [name] is a 70 year old  male who is now 3 months status post ORIF of tibial P line and fibula  fracture.  Postoperatively he did have some black eschar over his lateral  malleolus wound.  This area was treated with wet-to-dry's for at least 8  weeks, with really no improvement -- but no significant worsening nor  evidence of deep infection.  We discussed the debridement in the OR, with  removal of the underlying hardware.  The patient understood those risks  included persistence of nonhealing wound, nerve injury, vessel injury, need  for further surgery,  loss of reduction and other complications.  He wished  to proceed.   BRIEF SUMMARY OF PROCEDURE:  Douglas [name] was not administered preoperative  antibiotics, given the need to obtain intraoperative cultures.  He was taken  to the operating room, where general anesthesia was induced.  His right  lower extremity was prepped and draped in usual  sterile fashion.  The  old  fibular incision was reentered, and then a sharp debridement performed of  the desiccated exposed subcutaneous tissue and fascia.  We then continued  dissection deep to the bone.  The periosteum did appear intact over the  fibula.  The plate was not loose nor was there any evidence of purulence.  The plate was removed without complication, along with all screws.  The  patient then underwent a thorough irrigation and further debridement with  curet, after the sharp excision.  The anterior tissues did not seem to be  excessively vascularized, but the posterior tissues did appear healthy and  well vascularized.  A 2-0 nylon suture using far-near/near-far technique was  used to oppose the proximal and distal edges of the wound.  Then the central  portion, which was approximately 3.5 cm in length, was treated with a Wound-  Vac sponge.  The patient was then placed into a well-padded compressive  dressing to assist with mobilization of edema.  The patient did receive  empiric vancomycin  after deep cultures were obtained, which was essentially  right after incision.  A STAT Gram stain culture are pending.   PROGNOSIS:  Douglas [name] will remain in the hospital for the next 2-3 days,  with theWound Vacin place.  We are hopeful to return him to the OR at that  time for at least primary closure -- provided he has developed ample  granulation tissue and the tissues also can be opposed without tension.  We  will also obtain vascular studies, given the appearance of the anterior  tissues, to make sure the has adequate perfusion for wound healing.  He  remains at increased risk for DVT and persistence of infection, given the  nature of his soft tissue envelope.      Doralee Albino. Carola Frost, M.D.  Electronically Signed     MHH/MEDQ  D:  10/08/2005  T:  10/08/2005  Job:  [id number]

## 2010-08-10 NOTE — Op Note (Signed)
NAME:  ANOOP, Douglas [name]               ACCOUNT NO.:  [id number]   MEDICAL RECORD NO.:  [id number]          PATIENT TYPE:  AMB   LOCATION:  DAY                          FACILITY:  [facility]   PHYSICIAN:  Timothy E. Earlene Plater, M.D. DATE OF BIRTH:  [birth year]   DATE OF PROCEDURE:  12/23/2003  DATE OF DISCHARGE:                                 OPERATIVE REPORT   PREOPERATIVE DIAGNOSIS:  Internal and external hemorrhoids.   POSTOPERATIVE DIAGNOSIS:  Internal and external hemorrhoids.   PROCEDURE:  Hemorrhoidectomy.   SURGEON:  Timothy E. Earlene Plater, M.D.   ANESTHESIA:  General.   Mr. Lalone is a healthy 70 year old Caucasian male with persistent  hemorrhoidal protrusion and rectal bleeding, even after conservative  management in the office.  After careful consideration, he wishes to proceed  with surgery, as carefully described in the office.  He was seen,  identified, and evaluated by anesthesia.   Patient was taken to the operating room, placed supine.  He positioned  himself comfortably.  IV sedation given.  He was placed in the lithotomy  position.  Perianal area inspected, prepped and draped in the usual fashion.  A large prolapsing right posterior internal and external hemorrhoidal  complex was present.  As well, a posterior/left posterior internal  hemorrhoid was noted.  The other hemorrhoids had been successful treated in  the office.  The anus area was injected round and about with Marcaine,  epinephrine, and Wydase and massage in well.  The anoscope placed.  The  right posterior hemorrhoid grasped.  It was carefully dissected from the  underlying tissue.  The sphincter was observed, and the resulting wound was  closed with a 2-0 chromic suture to the anoderm.  The skin and underlying  varicosities were trimmed, and then the external portion was closed with a 3-  0 chromic.  Hemostasis was good.  A hemorrhoidal band ligator was used to  treat the posterior/left posterior hemorrhoid.   There were no complications.  The patient tolerated it well.  Gelfoam gauze and a dry sterile dressing  applied.  All counts correct.  He was removed to the recovery room in good  condition.  Written and verbal instructions were given to him and his wife,  and he will be seen in the office.     TED/MEDQ  D:  12/23/2003  T:  12/23/2003  Job:  [id number]   cc:   Loraine Leriche A. Waynard Edwards, M.D.  [address]  [place]  [place] [address]  Fax: [phone]

## 2010-08-10 NOTE — Op Note (Signed)
NAME:  Douglas [name], Douglas [name] NO.:  [id number]   MEDICAL RECORD NO.:  [id number]          PATIENT TYPE:  INP   LOCATION:  5006                         FACILITY:  [facility]   PHYSICIAN:  Doralee Albino. Carola Frost, M.D. DATE OF BIRTH:  05/08/1940   DATE OF PROCEDURE:  DATE OF DISCHARGE:                                 OPERATIVE REPORT   PREOPERATIVE DIAGNOSIS:  Open left ankle wound.   POSTOPERATIVE DIAGNOSIS:  Open left ankle wound.   PROCEDURE:  Irrigation, debridement and delayed primary closure of 6 cm  lateral ankle wound.   SURGEON:  Doralee Albino. Carola Frost, M.D.   ASSISTANT:  Aura Fey. Bobbe Medico.   ANESTHESIA:  General.   COMPLICATIONS:  None.   DRAINS:  None.   ESTIMATED BLOOD LOSS:  Scant.   DISPOSITION:  To PACU.   CONDITION:  Stable.   BRIEF SUMMARY AND INDICATIONS FOR PROCEDURE:  The patient is a 70 year old  male who underwent ORIF of his left pilon fracture three months ago.  Subsequently he had some wound edge necrosis and underwent wet-to-dry  dressings for a prolonged period of time without complete wound healing.  Because of his nonhealing wound, he was taken to the OR two days ago for  debridement and lateral plate removal with cultures obtained at that time  that thus far have been negative.  Wound vac was placed and he now returns  to the OR for delayed primary closure with marked reduction in his edema and  no sign of infection.   BRIEF DESCRIPTION OF PROCEDURE:  He remains on Vancomycin for antibiotic  coverage.  He was taken to the operating room where general anesthesia was  induced.  His left lower extremity was prepped and draped in usual sterile  fashion.  The wound vac was removed prior to this and the wound edges  appeared granulated with again marked reduction in edema of the lower  extremity.  The wound was irrigated thoroughly and then closed in layered  fashion using 0 PDS and 2-0 nylon as well as an additional 3-0 nylon.  Sterile gently  compressive dressing was applied.  The patient was awakened  from anesthesia and transported to PACU in stable condition.   PROGNOSIS:  The patient will be kept overnight for another day of  antibiotics.  By tomorrow his culture should be final.  We anticipate a  discharge tomorrow without antibiotics, will remain on Lovenox for DVT  prophylaxis while  in the hospital.  He will be partial weightbearing with crutches just to  limit the amount of shear on his lateral incision.  Once his wound appears  stable, will allow him to progress to weightbearing as tolerated without  restriction.  Will plan to see him back in the office in about 10 days for  further evaluation and removal of sutures.      Doralee Albino. Carola Frost, M.D.  Electronically Signed     MHH/MEDQ  D:  10/10/2005  T:  10/11/2005  Job:  [id number]

## 2010-08-28 ENCOUNTER — Encounter (INDEPENDENT_AMBULATORY_CARE_PROVIDER_SITE_OTHER): Payer: Self-pay | Admitting: General Surgery

## 2010-12-14 LAB — POCT HEMOGLOBIN-HEMACUE: Operator id: 268271

## 2011-01-29 ENCOUNTER — Other Ambulatory Visit: Payer: Self-pay | Admitting: Internal Medicine

## 2011-01-29 DIAGNOSIS — K439 Ventral hernia without obstruction or gangrene: Secondary | ICD-10-CM

## 2011-01-30 ENCOUNTER — Other Ambulatory Visit: Payer: Self-pay | Admitting: Internal Medicine

## 2011-01-30 DIAGNOSIS — K439 Ventral hernia without obstruction or gangrene: Secondary | ICD-10-CM

## 2011-01-31 ENCOUNTER — Ambulatory Visit
Admission: RE | Admit: 2011-01-31 | Discharge: 2011-01-31 | Disposition: A | Discharge status: Home / Self Care | Payer: Medicare Other | Source: Ambulatory Visit | Attending: Internal Medicine | Admitting: Internal Medicine

## 2011-01-31 DIAGNOSIS — K439 Ventral hernia without obstruction or gangrene: Secondary | ICD-10-CM

## 2011-01-31 MED ORDER — IOHEXOL 300 MG/ML  SOLN
100.0000 mL | Freq: Once | INTRAMUSCULAR | Status: AC | PRN
  Administered 2011-01-31: 100 mL via INTRAVENOUS

## 2011-02-19 ENCOUNTER — Ambulatory Visit (INDEPENDENT_AMBULATORY_CARE_PROVIDER_SITE_OTHER): Payer: Medicare Other | Admitting: Surgery

## 2011-02-19 ENCOUNTER — Encounter (INDEPENDENT_AMBULATORY_CARE_PROVIDER_SITE_OTHER): Payer: Self-pay | Admitting: Surgery

## 2011-02-19 DIAGNOSIS — K449 Diaphragmatic hernia without obstruction or gangrene: Secondary | ICD-10-CM | POA: Insufficient documentation

## 2011-02-19 DIAGNOSIS — K432 Incisional hernia without obstruction or gangrene: Secondary | ICD-10-CM

## 2011-02-19 DIAGNOSIS — K4020 Bilateral inguinal hernia, without obstruction or gangrene, not specified as recurrent: Secondary | ICD-10-CM

## 2011-02-19 DIAGNOSIS — M6200 Separation of muscle (nontraumatic), unspecified site: Secondary | ICD-10-CM

## 2011-02-19 DIAGNOSIS — M6208 Separation of muscle (nontraumatic), other site: Secondary | ICD-10-CM

## 2011-02-19 NOTE — Patient Instructions (Signed)
Hernia °A hernia occurs when an internal organ pushes out through a weak spot in the abdominal wall. Hernias most commonly occur in the groin and around the navel. Hernias often can be pushed back into place (reduced). Most hernias tend to get worse over time. Some abdominal hernias can get stuck in the opening (irreducible or incarcerated hernia) and cannot be reduced. An irreducible abdominal hernia which is tightly squeezed into the opening is at risk for impaired blood supply (strangulated hernia). A strangulated hernia is a medical emergency. Because of the risk for an irreducible or strangulated hernia, surgery may be recommended to repair a hernia. °CAUSES  °· Heavy lifting.  °· Prolonged coughing.  °· Straining to have a bowel movement.  °· A cut (incision) made during an abdominal surgery.  °HOME CARE INSTRUCTIONS  °· Bed rest is not required. You may continue your normal activities.  °· Avoid lifting more than 10 pounds (4.5 kg) or straining.  °· Cough gently. If you are a smoker it is best to stop. Even the best hernia repair can break down with the continual strain of coughing. Even if you do not have your hernia repaired, a cough will continue to aggravate the problem.  °· Do not wear anything tight over your hernia. Do not try to keep it in with an outside bandage or truss. These can damage abdominal contents if they are trapped within the hernia sac.  °· Eat a normal diet.  °· Avoid constipation. Straining over long periods of time will increase hernia size and encourage breakdown of repairs. If you cannot do this with diet alone, stool softeners may be used.  °SEEK IMMEDIATE MEDICAL CARE IF:  °· You have a fever.  °· You develop increasing abdominal pain.  °· You feel nauseous or vomit.  °· Your hernia is stuck outside the abdomen, looks discolored, feels hard, or is tender.  °· You have any changes in your bowel habits or in the hernia that are unusual for you.  °· You have increased pain or  swelling around the hernia.  °· You cannot push the hernia back in place by applying gentle pressure while lying down.  °MAKE SURE YOU:  °· Understand these instructions.  °· Will watch your condition.  °· Will get help right away if you are not doing well or get worse.  °Document Released: 03/11/2005 Document Revised: 11/21/2010 Document Reviewed: 10/29/2007 °[employer]® Patient Information ©2012 [employer]. °

## 2011-02-19 NOTE — Progress Notes (Signed)
Subjective:     Patient ID: Douglas [name], male   DOB: 09-Jul-1940, 70 y.o.   MRN: [id number]  HPI  Douglas [name]  08-09-1940 [id number]  Patient Care Team: Ezequiel Kayser, MD as PCP - General (Internal Medicine)  This patient is a 70 y.o.male who presents today for surgical evaluation at the request of Dr. Waynard Edwards.   He is a pleasant male who had emergent cholecystectomy and ERCP earlier this year. He noted a lump at his bellybutton a few months ago. It is usually out. It is uncomfortable. He works out at [employer] He received a CAT scan which showed no definite ventral wall hernia, but they did note a right angle hernia. He was sent to me for surgical evaluation.  Patient has bowel movements daily. Rather physically active. He has a known hiatal hernia but mild GERD. He no longer takes any Nexium.  Past Medical History  Diagnosis Date  . Thyroid disease   . Anemia   . GERD (gastroesophageal reflux disease)   . Hyperlipidemia   . Osteoporosis     Past Surgical History  Procedure Date  . Cholecystectomy   . Fibula fracture surgery     fibula and tibia fracture   . Hemorrhoid surgery   . Anal fissure repair     History   Social History  . Marital Status: Married    Spouse Name: N/A    Number of Children: N/A  . Years of Education: N/A   Occupational History  . Not on file.   Social History Main Topics  . Smoking status: Former Games developer  . Smokeless tobacco: Never Used  . Alcohol Use: No  . Drug Use: No  . Sexually Active:    Other Topics Concern  . Not on file   Social History Narrative  . No narrative on file    Family History  Problem Relation Age of Onset  . Stroke Mother   . Stroke Father     Current outpatient prescriptions:alendronate (FOSAMAX) 5 MG tablet, Take 5 mg by mouth daily before breakfast. Take with a full glass of water on an empty stomach. , Disp: , Rfl: ;  cetirizine (ZYRTEC) 5 MG tablet, Take 5 mg by mouth  daily.  , Disp: , Rfl: ;  fish oil-omega-3 fatty acids 1000 MG capsule, Take 2 g by mouth daily.  , Disp: , Rfl: ;  Levothyroxine Sodium (SYNTHROID PO), Take by mouth.  , Disp: , Rfl:  Multiple Vitamin (MULTIVITAMIN PO), Take by mouth daily.  , Disp: , Rfl: ;  simvastatin (ZOCOR) 80 MG tablet, Take 80 mg by mouth at bedtime.  , Disp: , Rfl:   No Known Allergies  BP 136/84  Pulse 64  Temp(Src) 97 F (36.1 C) (Temporal)  Resp 16  Ht 5\' 10"  (1.778 m)  Wt 186 lb 2 oz (84.426 kg)  BMI 26.71 kg/m2     Review of Systems  Constitutional: Negative for fever, chills and diaphoresis.  HENT: Negative for nosebleeds, sore throat, facial swelling, mouth sores, trouble swallowing and ear discharge.   Eyes: Negative for photophobia, discharge and visual disturbance.  Respiratory: Negative for choking, chest tightness, shortness of breath and stridor.   Cardiovascular: Negative for chest pain and palpitations.       Patient walks 60 minutes for about 3 miles without difficulty.  No exertional chest/neck/shoulder/arm pain.   Gastrointestinal: Positive for abdominal distention. Negative for nausea, vomiting, abdominal pain, diarrhea, constipation, blood  in stool, anal bleeding and rectal pain.  Genitourinary: Negative for dysuria, urgency, hematuria, discharge, penile swelling, scrotal swelling, difficulty urinating, penile pain and testicular pain.  Musculoskeletal: Negative for myalgias, back pain, arthralgias and gait problem.  Skin: Negative for color change, pallor, rash and wound.  Neurological: Negative for dizziness, speech difficulty, weakness, numbness and headaches.  Hematological: Negative for adenopathy. Does not bruise/bleed easily.  Psychiatric/Behavioral: Negative for hallucinations, confusion and agitation.       Objective:   Physical Exam  Constitutional: He is oriented to person, place, and time. He appears well-developed and well-nourished. No distress.  HENT:  Head:  Normocephalic.  Mouth/Throat: Oropharynx is clear and moist. No oropharyngeal exudate.  Eyes: Conjunctivae and EOM are normal. Pupils are equal, round, and reactive to light. No scleral icterus.  Neck: Normal range of motion. Neck supple. No tracheal deviation present.  Cardiovascular: Normal rate, regular rhythm and intact distal pulses.   Pulmonary/Chest: Effort normal and breath sounds normal. No respiratory distress.  Abdominal: Soft. He exhibits no distension. There is no tenderness. There is no rebound and no guarding. Hernia confirmed negative in the right inguinal area and confirmed negative in the left inguinal area.    Genitourinary: Penis normal.    No penile tenderness.  Musculoskeletal: Normal range of motion. He exhibits no tenderness.  Lymphadenopathy:    He has no cervical adenopathy.       Right: No inguinal adenopathy present.       Left: No inguinal adenopathy present.  Neurological: He is alert and oriented to person, place, and time. No cranial nerve deficit. He exhibits normal muscle tone. Coordination normal.  Skin: Skin is warm and dry. No rash noted. He is not diaphoretic. No erythema. No pallor.  Psychiatric: He has a normal mood and affect. His behavior is normal. Judgment and thought content normal.       Assessment:     Umb VWH & BIH.  Diastasis recti    Plan:     The anatomy & physiology of the abdominal wall was discussed.  The pathophysiology of hernias was discussed.  Natural history risks without surgery of enlargement, pain, incarceration & strangulation was discussed.   Contributors to complications such as smoking, obesity, diabetes, prior surgery, etc were discussed.  I feel the risks of no intervention will lead to serious problems that outweigh the operative risks; therefore, I recommended surgery to reduce and repair the hernia.  I explained laparoscopic techniques with possible need for an open approach.  I noted the probable use of mesh to  patch and/or buttress hernia repair.  I think he will need lap underlay mesh ?recurrent umb VWH.  Risks such as bleeding, infection, abscess, need for further treatment, heart attack, death, and other risks were discussed.  I noted a good likelihood this will help address the problem.   Goals of post-operative recovery were discussed as well.  Possibility that this will not correct all symptoms was explained.  I stressed the importance of low-impact activity, aggressive pain control, avoiding constipation, & not pushing through pain to minimize risk of post-operative chronic pain or injury. Possibility of reherniation was discussed.  We will work to minimize complications.   An educational handout further explaining the pathology & treatment options was given as well.  Questions were answered.  The patient expresses understanding & wishes to proceed with surgery.  He wishes to wait until after Holidays.  With mild symptoms, I think it is safe to wait.  Educated  on indications for emergency evaluation/surgery discussed also.

## 2011-04-02 DIAGNOSIS — Z79899 Other long term (current) drug therapy: Secondary | ICD-10-CM | POA: Diagnosis not present

## 2011-04-02 DIAGNOSIS — E039 Hypothyroidism, unspecified: Secondary | ICD-10-CM | POA: Diagnosis not present

## 2011-04-02 DIAGNOSIS — M810 Age-related osteoporosis without current pathological fracture: Secondary | ICD-10-CM | POA: Diagnosis not present

## 2011-04-02 DIAGNOSIS — Z125 Encounter for screening for malignant neoplasm of prostate: Secondary | ICD-10-CM | POA: Diagnosis not present

## 2011-04-02 DIAGNOSIS — E785 Hyperlipidemia, unspecified: Secondary | ICD-10-CM | POA: Diagnosis not present

## 2011-04-02 DIAGNOSIS — E291 Testicular hypofunction: Secondary | ICD-10-CM | POA: Diagnosis not present

## 2011-04-09 DIAGNOSIS — E291 Testicular hypofunction: Secondary | ICD-10-CM | POA: Diagnosis not present

## 2011-04-09 DIAGNOSIS — E039 Hypothyroidism, unspecified: Secondary | ICD-10-CM | POA: Diagnosis not present

## 2011-04-09 DIAGNOSIS — Z0000 Encounter for general adult medical examination without abnormal findings: Secondary | ICD-10-CM | POA: Diagnosis not present

## 2011-04-09 DIAGNOSIS — H6980 Other specified disorders of Eustachian tube, unspecified ear: Secondary | ICD-10-CM | POA: Diagnosis not present

## 2011-04-09 DIAGNOSIS — Z125 Encounter for screening for malignant neoplasm of prostate: Secondary | ICD-10-CM | POA: Diagnosis not present

## 2011-04-09 DIAGNOSIS — E785 Hyperlipidemia, unspecified: Secondary | ICD-10-CM | POA: Diagnosis not present

## 2011-04-10 DIAGNOSIS — Z1212 Encounter for screening for malignant neoplasm of rectum: Secondary | ICD-10-CM | POA: Diagnosis not present

## 2011-04-15 ENCOUNTER — Encounter (INDEPENDENT_AMBULATORY_CARE_PROVIDER_SITE_OTHER): Payer: Self-pay

## 2011-04-19 DIAGNOSIS — K439 Ventral hernia without obstruction or gangrene: Secondary | ICD-10-CM | POA: Diagnosis not present

## 2011-04-19 DIAGNOSIS — K4020 Bilateral inguinal hernia, without obstruction or gangrene, not specified as recurrent: Secondary | ICD-10-CM

## 2011-04-19 HISTORY — PX: HERNIA REPAIR: SHX51

## 2011-04-19 HISTORY — PX: LAPAROSCOPIC INGUINAL HERNIA REPAIR: SUR788

## 2011-05-01 DIAGNOSIS — E291 Testicular hypofunction: Secondary | ICD-10-CM | POA: Diagnosis not present

## 2011-05-07 ENCOUNTER — Encounter (INDEPENDENT_AMBULATORY_CARE_PROVIDER_SITE_OTHER): Payer: Medicare Other | Admitting: Surgery

## 2011-05-07 ENCOUNTER — Ambulatory Visit (INDEPENDENT_AMBULATORY_CARE_PROVIDER_SITE_OTHER): Payer: Medicare Other | Admitting: General Surgery

## 2011-05-07 ENCOUNTER — Encounter (INDEPENDENT_AMBULATORY_CARE_PROVIDER_SITE_OTHER): Payer: Self-pay | Admitting: General Surgery

## 2011-05-07 VITALS — BP 132/72 | HR 64 | Temp 97.8°F | Resp 16 | Ht 70.0 in | Wt 185.0 lb

## 2011-05-07 DIAGNOSIS — Z09 Encounter for follow-up examination after completed treatment for conditions other than malignant neoplasm: Secondary | ICD-10-CM

## 2011-05-07 NOTE — Progress Notes (Signed)
Subjective:     Patient ID: Douglas [name], male   DOB: Jul 25, 1940, 71 y.o.   MRN: [id number]  HPI 70 yom who underwent bilateral laparoscopic inguinal hernia repair and ventral hernia repair on 1/25.  He is concerned about his incisions being a little red.  He has no fever or drainage.  They have remained the same.  Review of Systems     Objective:   Physical Exam Incisions all healing well with scab, they all have minimal area of redness around them that is postop and not consistent with infection    Assessment:     S/p lap BIH, VH repair    Plan:     Normal postop course.  I told him if any drainage or increasing redness or fever to call. Will f/u with Dr. Michaell Cowing

## 2011-05-23 DIAGNOSIS — E291 Testicular hypofunction: Secondary | ICD-10-CM | POA: Diagnosis not present

## 2011-05-30 ENCOUNTER — Encounter (INDEPENDENT_AMBULATORY_CARE_PROVIDER_SITE_OTHER): Payer: Self-pay | Admitting: Surgery

## 2011-06-04 ENCOUNTER — Encounter (INDEPENDENT_AMBULATORY_CARE_PROVIDER_SITE_OTHER): Payer: Medicare Other | Admitting: Surgery

## 2011-06-05 ENCOUNTER — Encounter (INDEPENDENT_AMBULATORY_CARE_PROVIDER_SITE_OTHER): Payer: Self-pay | Admitting: Surgery

## 2011-06-05 ENCOUNTER — Ambulatory Visit (INDEPENDENT_AMBULATORY_CARE_PROVIDER_SITE_OTHER): Payer: Medicare Other | Admitting: Surgery

## 2011-06-05 VITALS — BP 136/82 | HR 70 | Temp 97.9°F | Resp 18 | Ht 70.0 in | Wt 184.4 lb

## 2011-06-05 DIAGNOSIS — K432 Incisional hernia without obstruction or gangrene: Secondary | ICD-10-CM

## 2011-06-05 DIAGNOSIS — K4020 Bilateral inguinal hernia, without obstruction or gangrene, not specified as recurrent: Secondary | ICD-10-CM

## 2011-06-05 NOTE — Progress Notes (Signed)
Subjective:     Patient ID: Douglas [name], male   DOB: [birth year], 71 y.o.   MRN: [id number]  HPI  Douglas [name]  11/10/1940 [id number]  Patient Care Team: Ezequiel Kayser, MD as PCP - General (Internal Medicine)  This patient is a 71 y.o.male who presents today for surgical evaluation.   Procedure: Laparoscopic bilateral inguinal hernia repairs. Laparoscopic periumbilical ventral herniorrhaphy with mesh 04/19/2011  The patient comes in today feeling better. No nausea or vomiting. Urinating fine. Energy level is good. Incision was healing up. Bruising resolved. Wanting to move to unrestricted activities such as sit ups.  Patient Active Problem List  Diagnoses  . ANEMIA, IRON DEFICIENCY  . GERD  . Hiatal hernia  . Bilateral inguinal hernia (BIH), R>L  . Incisional umbilical hernia  . Diastasis recti    Past Medical History  Diagnosis Date  . Thyroid disease   . Anemia   . GERD (gastroesophageal reflux disease)   . Hyperlipidemia   . Osteoporosis   . Bilateral inguinal hernia     Past Surgical History  Procedure Date  . Cholecystectomy   . Fibula fracture surgery     fibula and tibia fracture   . Hemorrhoid surgery   . Anal fissure repair   . Laparoscopic inguinal hernia repair 04/19/11    bilateral  . Hernia repair 04/19/11    bilateral    History   Social History  . Marital Status: Married    Spouse Name: N/A    Number of Children: N/A  . Years of Education: N/A   Occupational History  . Not on file.   Social History Main Topics  . Smoking status: Former Smoker    Quit date: 06/05/1998  . Smokeless tobacco: Never Used  . Alcohol Use: No  . Drug Use: No  . Sexually Active: Not on file   Other Topics Concern  . Not on file   Social History Narrative  . No narrative on file    Family History  Problem Relation Age of Onset  . Stroke Mother   . Stroke Father     Current outpatient prescriptions:alendronate (FOSAMAX) 5 MG tablet, Take 5 mg by  mouth daily before breakfast. Take with a full glass of water on an empty stomach. , Disp: , Rfl: ;  cetirizine (ZYRTEC) 5 MG tablet, Take 5 mg by mouth daily.  , Disp: , Rfl: ;  fish oil-omega-3 fatty acids 1000 MG capsule, Take 2 g by mouth daily.  , Disp: , Rfl: ;  Levothyroxine Sodium (SYNTHROID PO), Take by mouth.  , Disp: , Rfl:  Multiple Vitamin (MULTIVITAMIN PO), Take by mouth daily.  , Disp: , Rfl: ;  simvastatin (ZOCOR) 80 MG tablet, Take 80 mg by mouth at bedtime.  , Disp: , Rfl:   No Known Allergies  BP 136/82  Pulse 70  Temp(Src) 97.9 F (36.6 C) (Temporal)  Resp 18  Ht 5\' 10"  (1.778 m)  Wt 184 lb 6.4 oz (83.643 kg)  BMI 26.46 kg/m2     Review of Systems  Constitutional: Negative for fever, chills and diaphoresis.  HENT: Negative for sore throat, trouble swallowing and neck pain.   Eyes: Negative for photophobia and visual disturbance.  Respiratory: Negative for choking and shortness of breath.   Cardiovascular: Negative for chest pain and palpitations.  Gastrointestinal: Negative for nausea, vomiting, abdominal distention, anal bleeding and rectal pain.  Genitourinary: Negative for dysuria, urgency, difficulty urinating and testicular pain.  Musculoskeletal: Negative  for myalgias, arthralgias and gait problem.  Skin: Negative for color change and rash.  Neurological: Negative for dizziness, speech difficulty, weakness and numbness.  Hematological: Negative for adenopathy.  Psychiatric/Behavioral: Negative for hallucinations, confusion and agitation.       Objective:   Physical Exam  Constitutional: He is oriented to person, place, and time. He appears well-developed and well-nourished. No distress.  HENT:  Head: Normocephalic.  Mouth/Throat: Oropharynx is clear and moist. No oropharyngeal exudate.  Eyes: Conjunctivae and EOM are normal. Pupils are equal, round, and reactive to light. No scleral icterus.  Neck: Normal range of motion. No tracheal deviation  present.  Cardiovascular: Normal rate, normal heart sounds and intact distal pulses.   Pulmonary/Chest: Effort normal. No respiratory distress.  Abdominal: Soft. He exhibits no distension. There is no tenderness. Hernia confirmed negative in the right inguinal area and confirmed negative in the left inguinal area.       Incisions clean with normal healing ridges.  No hernias  Musculoskeletal: Normal range of motion. He exhibits no tenderness.  Neurological: He is alert and oriented to person, place, and time. No cranial nerve deficit. He exhibits normal muscle tone. Coordination normal.  Skin: Skin is warm and dry. No rash noted. He is not diaphoretic.  Psychiatric: He has a normal mood and affect. His behavior is normal.       Assessment:     Healing well from lap repairs of many hernias    Plan:     Increase activity as tolerated.  Do not push through pain.  Move to unrestricted activity as tolerated. Start with less intense exercise first  Advanced on diet as tolerated. Bowel regimen to avoid problems.  Return to clinic p.r.n. The patient expressed understanding and appreciation

## 2011-06-11 DIAGNOSIS — E291 Testicular hypofunction: Secondary | ICD-10-CM | POA: Diagnosis not present

## 2011-07-02 DIAGNOSIS — E291 Testicular hypofunction: Secondary | ICD-10-CM | POA: Diagnosis not present

## 2011-07-23 DIAGNOSIS — H21239 Degeneration of iris (pigmentary), unspecified eye: Secondary | ICD-10-CM | POA: Diagnosis not present

## 2011-07-23 DIAGNOSIS — H40019 Open angle with borderline findings, low risk, unspecified eye: Secondary | ICD-10-CM | POA: Diagnosis not present

## 2011-07-31 DIAGNOSIS — E291 Testicular hypofunction: Secondary | ICD-10-CM | POA: Diagnosis not present

## 2011-08-08 DIAGNOSIS — Z0100 Encounter for examination of eyes and vision without abnormal findings: Secondary | ICD-10-CM | POA: Diagnosis not present

## 2011-08-22 DIAGNOSIS — E291 Testicular hypofunction: Secondary | ICD-10-CM | POA: Diagnosis not present

## 2011-09-10 DIAGNOSIS — E291 Testicular hypofunction: Secondary | ICD-10-CM | POA: Diagnosis not present

## 2011-10-02 DIAGNOSIS — E291 Testicular hypofunction: Secondary | ICD-10-CM | POA: Diagnosis not present

## 2011-10-07 ENCOUNTER — Ambulatory Visit (INDEPENDENT_AMBULATORY_CARE_PROVIDER_SITE_OTHER): Payer: Medicare Other | Admitting: Surgery

## 2011-10-07 ENCOUNTER — Encounter (INDEPENDENT_AMBULATORY_CARE_PROVIDER_SITE_OTHER): Payer: Self-pay | Admitting: Surgery

## 2011-10-07 VITALS — BP 152/84 | HR 60 | Temp 98.0°F | Resp 16 | Ht 70.0 in | Wt 185.8 lb

## 2011-10-07 DIAGNOSIS — M6200 Separation of muscle (nontraumatic), unspecified site: Secondary | ICD-10-CM | POA: Diagnosis not present

## 2011-10-07 DIAGNOSIS — M6208 Separation of muscle (nontraumatic), other site: Secondary | ICD-10-CM

## 2011-10-07 DIAGNOSIS — R1909 Other intra-abdominal and pelvic swelling, mass and lump: Secondary | ICD-10-CM | POA: Diagnosis not present

## 2011-10-07 NOTE — Progress Notes (Signed)
Subjective:     Patient ID: Douglas [name], male   DOB: 09/07/1940, 71 y.o.   MRN: [id number]  HPI   Heraclio A Deren  07/02/1940 [id number]  Patient Care Team: Mark A Perini, MD as PCP - General (Internal Medicine)  This patient is a 71 y.o.male who presents today for surgical evaluation.   Procedure: Laparoscopic bilateral inguinal hernia repairs. Laparoscopic periumbilical ventral herniorrhaphy with mesh 04/19/2011  The patient comes in today feeling okay.  However, he noticed a lump at his bellybutton again.  It is soft.  It is not particularly large.  He think it may have gotten larger.  It is not very painful.  But it does concern him given the fact that he had his umbilical hernia start this way in a similar fashion.  Eating well.  Weight is stable.  He denies gaining weight or any other changes.  1lb weight gain in 4 months.  No infections.  Patient Active Problem List  Diagnosis  . ANEMIA, IRON DEFICIENCY  . GERD  . Hiatal hernia  . Diastasis recti  . Umbilical mass, possible recurrent umbilical hernia    Past Medical History  Diagnosis Date  . Thyroid disease   . GERD (gastroesophageal reflux disease)   . Hyperlipidemia   . Osteoporosis   . Bilateral inguinal hernia   . Anemia     Past Surgical History  Procedure Date  . Cholecystectomy   . Fibula fracture surgery     fibula and tibia fracture   . Hemorrhoid surgery   . Anal fissure repair   . Laparoscopic inguinal hernia repair 04/19/11    bilateral  . Hernia repair 04/19/11    bilateral    History   Social History  . Marital Status: Married    Spouse Name: N/A    Number of Children: N/A  . Years of Education: N/A   Occupational History  . Not on file.   Social History Main Topics  . Smoking status: Former Smoker    Quit date: 06/05/1998  . Smokeless tobacco: Never Used  . Alcohol Use: No  . Drug Use: No  . Sexually Active: Not on file   Other Topics Concern  . Not on file   Social  History Narrative  . No narrative on file    Family History  Problem Relation Age of Onset  . Stroke Mother   . Stroke Father     Current outpatient prescriptions:alendronate (FOSAMAX) 5 MG tablet, Take 5 mg by mouth daily before breakfast. Take with a full glass of water on an empty stomach. , Disp: , Rfl: ;  cetirizine (ZYRTEC) 5 MG tablet, Take 5 mg by mouth daily.  , Disp: , Rfl: ;  fish oil-omega-3 fatty acids 1000 MG capsule, Take 2 g by mouth daily.  , Disp: , Rfl: ;  Levothyroxine Sodium (SYNTHROID PO), Take by mouth.  , Disp: , Rfl:  Multiple Vitamin (MULTIVITAMIN PO), Take by mouth daily.  , Disp: , Rfl: ;  simvastatin (ZOCOR) 80 MG tablet, Take 80 mg by mouth at bedtime.  , Disp: , Rfl:   No Known Allergies  BP 152/84  Pulse 60  Temp 98 F (36.7 C) (Temporal)  Resp 16  Ht 5' 10" (1.778 m)  Wt 185 lb 12.8 oz (84.278 kg)  BMI 26.66 kg/m2     Review of Systems  Constitutional: Negative for fever, chills and diaphoresis.  HENT: Negative for sore throat, trouble swallowing and neck pain.     Eyes: Negative for photophobia and visual disturbance.  Respiratory: Negative for choking and shortness of breath.   Cardiovascular: Negative for chest pain and palpitations.  Gastrointestinal: Negative for nausea, vomiting, abdominal distention, anal bleeding and rectal pain.  Genitourinary: Negative for dysuria, urgency, difficulty urinating and testicular pain.  Musculoskeletal: Negative for myalgias, arthralgias and gait problem.  Skin: Negative for color change and rash.  Neurological: Negative for dizziness, speech difficulty, weakness and numbness.  Hematological: Negative for adenopathy.  Psychiatric/Behavioral: Negative for hallucinations, confusion and agitation.       Objective:   Physical Exam  Constitutional: He is oriented to person, place, and time. He appears well-developed and well-nourished. No distress.  HENT:  Head: Normocephalic.  Mouth/Throat: Oropharynx  is clear and moist. No oropharyngeal exudate.  Eyes: Conjunctivae and EOM are normal. Pupils are equal, round, and reactive to light. No scleral icterus.  Neck: Normal range of motion. No tracheal deviation present.  Cardiovascular: Normal rate, normal heart sounds and intact distal pulses.   Pulmonary/Chest: Effort normal. No respiratory distress.  Abdominal: Soft. He exhibits no distension. There is no tenderness. Hernia confirmed negative in the right inguinal area and confirmed negative in the left inguinal area.       Incisions clean with normal healing ridges.  Soft mobile 1.5cm mass in right upper part of umbilicus.  Possible hernia underneath.  Diastasis recti unchanged  Musculoskeletal: Normal range of motion. He exhibits no tenderness.  Neurological: He is alert and oriented to person, place, and time. No cranial nerve deficit. He exhibits normal muscle tone. Coordination normal.  Skin: Skin is warm and dry. No rash noted. He is not diaphoretic.  Psychiatric: He has a normal mood and affect. His behavior is normal.       Assessment:     Umbilical mobile mass, hernia recurrence vs old hernia sac    Plan:     Do not know if the primary repair broke down and this is just a preperitoneal mass coming through.  Perhaps the mesh is contracted.  Perhaps this is a small lipoma.  Nonetheless, I think it is reasonable to do diagnostic laparoscopy to landed see what is going on.  If all he has is a local hernia recurrence but the mesh underneath is intact I would just remove the mass.  If he has a true hernia recurrence he may need another piece of mesh in place.  The anatomy & physiology of the abdominal wall was discussed.  The pathophysiology of hernias was discussed.  Natural history risks without surgery including progeressive enlargement, pain, incarceration & strangulation was discussed.   Contributors to complications such as smoking, obesity, diabetes, prior surgery, etc were discussed.     I feel the risks of no intervention will lead to serious problems that outweigh the operative risks; therefore, I recommended surgery to reduce and repair the hernia.  I explained laparoscopic techniques with possible need for an open approach.  I noted the probable use of mesh to patch and/or buttress the hernia repair  Risks such as bleeding, infection, abscess, need for further treatment, heart attack, death, and other risks were discussed.  I noted a good likelihood this will help address the problem.   Goals of post-operative recovery were discussed as well.  Possibility that this will not correct all symptoms was explained.  I stressed the importance of low-impact activity, aggressive pain control, avoiding constipation, & not pushing through pain to minimize risk of post-operative chronic pain or injury. Possibility   of reherniation especially with smoking, obesity, diabetes, immunosuppression, and other health conditions was discussed.  We will work to minimize complications.     An educational handout further explaining the pathology & treatment options was given as well.  Questions were answered.  The patient expresses understanding & wishes to proceed with surgery.          

## 2011-10-07 NOTE — Patient Instructions (Signed)
Hernia  A hernia occurs when an internal organ pushes out through a weak spot in the abdominal wall. Hernias most commonly occur in the groin and around the navel. Hernias often can be pushed back into place (reduced). Most hernias tend to get worse over time. Some abdominal hernias can get stuck in the opening (irreducible or incarcerated hernia) and cannot be reduced. An irreducible abdominal hernia which is tightly squeezed into the opening is at risk for impaired blood supply (strangulated hernia). A strangulated hernia is a medical emergency. Because of the risk for an irreducible or strangulated hernia, surgery may be recommended to repair a hernia.  CAUSES    Heavy lifting.   Prolonged coughing.   Straining to have a bowel movement.   A cut (incision) made during an abdominal surgery.  HOME CARE INSTRUCTIONS    Bed rest is not required. You may continue your normal activities.   Avoid lifting more than 10 pounds (4.5 kg) or straining.   Cough gently. If you are a smoker it is best to stop. Even the best hernia repair can break down with the continual strain of coughing. Even if you do not have your hernia repaired, a cough will continue to aggravate the problem.   Do not wear anything tight over your hernia. Do not try to keep it in with an outside bandage or truss. These can damage abdominal contents if they are trapped within the hernia sac.   Eat a normal diet.   Avoid constipation. Straining over long periods of time will increase hernia size and encourage breakdown of repairs. If you cannot do this with diet alone, stool softeners may be used.  SEEK IMMEDIATE MEDICAL CARE IF:    You have a fever.   You develop increasing abdominal pain.   You feel nauseous or vomit.   Your hernia is stuck outside the abdomen, looks discolored, feels hard, or is tender.   You have any changes in your bowel habits or in the hernia that are unusual for you.   You have increased pain or swelling around the  hernia.   You cannot push the hernia back in place by applying gentle pressure while lying down.  MAKE SURE YOU:    Understand these instructions.   Will watch your condition.   Will get help right away if you are not doing well or get worse.  Document Released: 03/11/2005 Document Revised: 02/28/2011 Document Reviewed: 10/29/2007  [employer] Patient Information 2012 [employer]

## 2011-10-08 ENCOUNTER — Encounter (HOSPITAL_COMMUNITY): Payer: Self-pay | Admitting: Pharmacy Technician

## 2011-10-08 DIAGNOSIS — H6980 Other specified disorders of Eustachian tube, unspecified ear: Secondary | ICD-10-CM | POA: Diagnosis not present

## 2011-10-09 ENCOUNTER — Encounter (HOSPITAL_COMMUNITY)
Admission: RE | Admit: 2011-10-09 | Discharge: 2011-10-09 | Disposition: A | Discharge status: Home / Self Care | Payer: Medicare Other | Source: Ambulatory Visit | Attending: Surgery | Admitting: Surgery

## 2011-10-09 ENCOUNTER — Encounter (HOSPITAL_COMMUNITY): Payer: Self-pay

## 2011-10-09 DIAGNOSIS — D509 Iron deficiency anemia, unspecified: Secondary | ICD-10-CM | POA: Diagnosis not present

## 2011-10-09 DIAGNOSIS — Z01812 Encounter for preprocedural laboratory examination: Secondary | ICD-10-CM | POA: Diagnosis not present

## 2011-10-09 DIAGNOSIS — K219 Gastro-esophageal reflux disease without esophagitis: Secondary | ICD-10-CM | POA: Diagnosis not present

## 2011-10-09 DIAGNOSIS — K449 Diaphragmatic hernia without obstruction or gangrene: Secondary | ICD-10-CM | POA: Diagnosis not present

## 2011-10-09 DIAGNOSIS — R1905 Periumbilic swelling, mass or lump: Secondary | ICD-10-CM | POA: Diagnosis not present

## 2011-10-09 DIAGNOSIS — G4730 Sleep apnea, unspecified: Secondary | ICD-10-CM | POA: Diagnosis not present

## 2011-10-09 DIAGNOSIS — E669 Obesity, unspecified: Secondary | ICD-10-CM | POA: Diagnosis not present

## 2011-10-09 DIAGNOSIS — E039 Hypothyroidism, unspecified: Secondary | ICD-10-CM | POA: Diagnosis not present

## 2011-10-09 DIAGNOSIS — M6200 Separation of muscle (nontraumatic), unspecified site: Secondary | ICD-10-CM | POA: Diagnosis not present

## 2011-10-09 DIAGNOSIS — Z01810 Encounter for preprocedural cardiovascular examination: Secondary | ICD-10-CM | POA: Diagnosis not present

## 2011-10-09 HISTORY — DX: Sleep apnea, unspecified: G47.30

## 2011-10-09 HISTORY — DX: Endocrine disorder, unspecified: E34.9

## 2011-10-09 HISTORY — DX: Hypothyroidism, unspecified: E03.9

## 2011-10-09 HISTORY — DX: Chronic kidney disease, unspecified: N18.9

## 2011-10-09 LAB — BASIC METABOLIC PANEL
BUN: 15 mg/dL (ref 6–23)
CO2: 30 mEq/L (ref 19–32)
Calcium: 9.5 mg/dL (ref 8.4–10.5)
Creatinine, Ser: 1.31 mg/dL (ref 0.50–1.35)
GFR calc non Af Amer: 54 mL/min — ABNORMAL LOW (ref >90)
Glucose, Bld: 80 mg/dL (ref 70–99)

## 2011-10-09 LAB — CBC
MCH: 32 pg (ref 26.0–34.0)
MCHC: 35.1 g/dL (ref 30.0–36.0)
MCV: 91.3 fL (ref 78.0–100.0)
Platelets: 235 10*3/uL (ref 150–400)
RBC: 5.43 MIL/uL (ref 4.22–5.81)
RDW: 13 % (ref 11.5–15.5)

## 2011-10-09 LAB — SURGICAL PCR SCREEN: MRSA, PCR: NEGATIVE

## 2011-10-09 NOTE — Pre-Procedure Instructions (Signed)
20 Douglas [name]  10/09/2011   Your procedure is scheduled on:  10/11/2011  Report to [facility] at 9:00 AM.  Call this number if you have problems the morning of surgery: [phone]   Remember:   Do not eat food or drink :After Midnight. THURSDAY    Take these medicines the morning of surgery with A SIP OF WATER: Levothyroxine  Do not wear jewelry, make-up or nail polish.  Do not wear lotions, powders, or perfumes. You may wear deodorant.  Do not shave 48 hours prior to surgery. Men may shave face and neck.  Do not bring valuables to the hospital.  Contacts, dentures or bridgework may not be worn into surgery.  Leave suitcase in the car. After surgery it may be brought to your room.  For patients admitted to the hospital, checkout time is 11:00 AM the day of discharge.   Patients discharged the day of surgery will not be allowed to drive home.  Name and phone number of your driver: with wife  Special Instructions: CHG Shower Use Special Wash: 1/2 bottle night before surgery and 1/2 bottle morning of surgery.   Please read over the following fact sheets that you were given: Pain Booklet, Coughing and Deep Breathing, MRSA Information and Surgical Site Infection Prevention

## 2011-10-10 MED ORDER — CHLORHEXIDINE GLUCONATE 4 % EX LIQD: 1.0000 "application " | Freq: Once | CUTANEOUS | Status: DC

## 2011-10-10 MED ORDER — CEFAZOLIN SODIUM 1 G IJ SOLR
3.0000 g | INTRAMUSCULAR | Status: DC
  Filled 2011-10-10 (×2): qty 30

## 2011-10-11 ENCOUNTER — Ambulatory Visit (HOSPITAL_COMMUNITY): Payer: Medicare Other | Admitting: Certified Registered"

## 2011-10-11 ENCOUNTER — Ambulatory Visit (HOSPITAL_COMMUNITY)
Admission: RE | Admit: 2011-10-11 | Discharge: 2011-10-11 | Disposition: A | Discharge status: Home / Self Care | Payer: Medicare Other | Source: Ambulatory Visit | Attending: Surgery | Admitting: Surgery

## 2011-10-11 ENCOUNTER — Encounter (HOSPITAL_COMMUNITY)
Admission: RE | Disposition: A | Discharge status: Home / Self Care | Payer: Self-pay | Source: Ambulatory Visit | Attending: Surgery

## 2011-10-11 ENCOUNTER — Encounter (HOSPITAL_COMMUNITY): Payer: Self-pay | Admitting: *Deleted

## 2011-10-11 ENCOUNTER — Encounter (HOSPITAL_COMMUNITY): Payer: Self-pay | Admitting: Certified Registered"

## 2011-10-11 DIAGNOSIS — E039 Hypothyroidism, unspecified: Secondary | ICD-10-CM | POA: Insufficient documentation

## 2011-10-11 DIAGNOSIS — K219 Gastro-esophageal reflux disease without esophagitis: Secondary | ICD-10-CM | POA: Diagnosis not present

## 2011-10-11 DIAGNOSIS — D509 Iron deficiency anemia, unspecified: Secondary | ICD-10-CM | POA: Insufficient documentation

## 2011-10-11 DIAGNOSIS — E669 Obesity, unspecified: Secondary | ICD-10-CM | POA: Insufficient documentation

## 2011-10-11 DIAGNOSIS — Z01810 Encounter for preprocedural cardiovascular examination: Secondary | ICD-10-CM | POA: Insufficient documentation

## 2011-10-11 DIAGNOSIS — R1905 Periumbilic swelling, mass or lump: Secondary | ICD-10-CM | POA: Diagnosis not present

## 2011-10-11 DIAGNOSIS — K429 Umbilical hernia without obstruction or gangrene: Secondary | ICD-10-CM

## 2011-10-11 DIAGNOSIS — G4730 Sleep apnea, unspecified: Secondary | ICD-10-CM | POA: Insufficient documentation

## 2011-10-11 DIAGNOSIS — K449 Diaphragmatic hernia without obstruction or gangrene: Secondary | ICD-10-CM | POA: Diagnosis not present

## 2011-10-11 DIAGNOSIS — Z01812 Encounter for preprocedural laboratory examination: Secondary | ICD-10-CM | POA: Insufficient documentation

## 2011-10-11 DIAGNOSIS — M6200 Separation of muscle (nontraumatic), unspecified site: Secondary | ICD-10-CM | POA: Insufficient documentation

## 2011-10-11 DIAGNOSIS — N189 Chronic kidney disease, unspecified: Secondary | ICD-10-CM | POA: Diagnosis not present

## 2011-10-11 DIAGNOSIS — K4020 Bilateral inguinal hernia, without obstruction or gangrene, not specified as recurrent: Secondary | ICD-10-CM | POA: Diagnosis not present

## 2011-10-11 HISTORY — PX: MASS EXCISION: SHX2000

## 2011-10-11 HISTORY — PX: LAPAROSCOPY: SHX197

## 2011-10-11 SURGERY — LAPAROSCOPY, DIAGNOSTIC
Anesthesia: General | Site: Abdomen | Wound class: Clean

## 2011-10-11 MED ORDER — ONDANSETRON HCL 4 MG/2ML IJ SOLN
INTRAMUSCULAR | Status: DC | PRN
  Administered 2011-10-11: 4 mg via INTRAVENOUS

## 2011-10-11 MED ORDER — OXYCODONE HCL 5 MG PO TABS: 5.0000 mg | ORAL_TABLET | ORAL | Status: AC | PRN

## 2011-10-11 MED ORDER — MIDAZOLAM HCL 5 MG/5ML IJ SOLN
INTRAMUSCULAR | Status: DC | PRN
  Administered 2011-10-11: 2 mg via INTRAVENOUS

## 2011-10-11 MED ORDER — BUPIVACAINE-EPINEPHRINE PF 0.25-1:200000 % IJ SOLN
INTRAMUSCULAR | Status: AC
  Filled 2011-10-11: qty 30

## 2011-10-11 MED ORDER — BUPIVACAINE-EPINEPHRINE 0.25% -1:200000 IJ SOLN
INTRAMUSCULAR | Status: DC | PRN
  Administered 2011-10-11: 30 mL

## 2011-10-11 MED ORDER — PROMETHAZINE HCL 25 MG/ML IJ SOLN: 6.2500 mg | INTRAMUSCULAR | Status: DC | PRN

## 2011-10-11 MED ORDER — MEPERIDINE HCL 25 MG/ML IJ SOLN: 6.2500 mg | INTRAMUSCULAR | Status: DC | PRN

## 2011-10-11 MED ORDER — FENTANYL CITRATE 0.05 MG/ML IJ SOLN
INTRAMUSCULAR | Status: DC | PRN
  Administered 2011-10-11 (×2): 50 ug via INTRAVENOUS
  Administered 2011-10-11: 150 ug via INTRAVENOUS

## 2011-10-11 MED ORDER — LIDOCAINE HCL (CARDIAC) 20 MG/ML IV SOLN
INTRAVENOUS | Status: DC | PRN
  Administered 2011-10-11: 100 mg via INTRAVENOUS

## 2011-10-11 MED ORDER — CEFAZOLIN SODIUM 1-5 GM-% IV SOLN
INTRAVENOUS | Status: DC | PRN
  Administered 2011-10-11: 3 g via INTRAVENOUS

## 2011-10-11 MED ORDER — PROPOFOL 10 MG/ML IV EMUL
INTRAVENOUS | Status: DC | PRN
  Administered 2011-10-11: 200 mg via INTRAVENOUS

## 2011-10-11 MED ORDER — NEOSTIGMINE METHYLSULFATE 1 MG/ML IJ SOLN
INTRAMUSCULAR | Status: DC | PRN
  Administered 2011-10-11: 5 mg via INTRAVENOUS

## 2011-10-11 MED ORDER — 0.9 % SODIUM CHLORIDE (POUR BTL) OPTIME
TOPICAL | Status: DC | PRN
  Administered 2011-10-11: 1000 mL

## 2011-10-11 MED ORDER — ROCURONIUM BROMIDE 100 MG/10ML IV SOLN
INTRAVENOUS | Status: DC | PRN
  Administered 2011-10-11: 50 mg via INTRAVENOUS

## 2011-10-11 MED ORDER — HYDROMORPHONE HCL PF 1 MG/ML IJ SOLN: 0.2500 mg | INTRAMUSCULAR | Status: DC | PRN

## 2011-10-11 MED ORDER — KETOROLAC TROMETHAMINE 30 MG/ML IJ SOLN
15.0000 mg | Freq: Once | INTRAMUSCULAR | Status: AC | PRN
Start: 2011-10-11 — End: 2011-10-11
  Administered 2011-10-11: 30 mg via INTRAVENOUS

## 2011-10-11 MED ORDER — LACTATED RINGERS IV SOLN
INTRAVENOUS | Status: DC
  Administered 2011-10-11: 11:00:00 via INTRAVENOUS

## 2011-10-11 MED ORDER — LACTATED RINGERS IV SOLN
INTRAVENOUS | Status: DC | PRN
  Administered 2011-10-11 (×2): via INTRAVENOUS

## 2011-10-11 MED ORDER — KETOROLAC TROMETHAMINE 30 MG/ML IJ SOLN
INTRAMUSCULAR | Status: AC
  Filled 2011-10-11: qty 1

## 2011-10-11 MED ORDER — GLYCOPYRROLATE 0.2 MG/ML IJ SOLN
INTRAMUSCULAR | Status: DC | PRN
  Administered 2011-10-11: .8 mg via INTRAVENOUS

## 2011-10-11 SURGICAL SUPPLY — 51 items
ADH SKN CLS APL DERMABOND .7 (GAUZE/BANDAGES/DRESSINGS) ×2
APPLIER CLIP LOGIC TI 5 (MISCELLANEOUS) IMPLANT
APR CLP MED LRG 33X5 (MISCELLANEOUS)
BLADE SURG ROTATE 9660 (MISCELLANEOUS) ×2 IMPLANT
CANISTER SUCTION 2500CC (MISCELLANEOUS) IMPLANT
CHLORAPREP W/TINT 26ML (MISCELLANEOUS) ×3 IMPLANT
CLOTH BEACON ORANGE TIMEOUT ST (SAFETY) ×3 IMPLANT
COVER SURGICAL LIGHT HANDLE (MISCELLANEOUS) ×3 IMPLANT
DERMABOND ADVANCED (GAUZE/BANDAGES/DRESSINGS) ×1
DERMABOND ADVANCED .7 DNX12 (GAUZE/BANDAGES/DRESSINGS) ×2 IMPLANT
DEVICE TROCAR PUNCTURE CLOSURE (ENDOMECHANICALS) ×3 IMPLANT
DRAPE WARM FLUID 44X44 (DRAPE) ×3 IMPLANT
DRSG TEGADERM 4X4.75 (GAUZE/BANDAGES/DRESSINGS) ×2 IMPLANT
ELECT REM PT RETURN 9FT ADLT (ELECTROSURGICAL) ×3
ELECTRODE REM PT RTRN 9FT ADLT (ELECTROSURGICAL) ×2 IMPLANT
GAUZE SPONGE 2X2 8PLY STRL LF (GAUZE/BANDAGES/DRESSINGS) ×2 IMPLANT
GLOVE BIO SURGEON STRL SZ7.5 (GLOVE) ×4 IMPLANT
GLOVE BIOGEL PI IND STRL 7.0 (GLOVE) ×1 IMPLANT
GLOVE BIOGEL PI IND STRL 7.5 (GLOVE) ×2 IMPLANT
GLOVE BIOGEL PI IND STRL 8 (GLOVE) ×2 IMPLANT
GLOVE BIOGEL PI INDICATOR 7.0 (GLOVE) ×1
GLOVE BIOGEL PI INDICATOR 7.5 (GLOVE) ×2
GLOVE BIOGEL PI INDICATOR 8 (GLOVE) ×1
GLOVE ECLIPSE 7.5 STRL STRAW (GLOVE) ×2 IMPLANT
GLOVE ECLIPSE 8.0 STRL XLNG CF (GLOVE) ×3 IMPLANT
GOWN PREVENTION PLUS XLARGE (GOWN DISPOSABLE) ×3 IMPLANT
GOWN STRL NON-REIN LRG LVL3 (GOWN DISPOSABLE) ×8 IMPLANT
KIT BASIN OR (CUSTOM PROCEDURE TRAY) ×3 IMPLANT
KIT ROOM TURNOVER OR (KITS) ×3 IMPLANT
NDL SPNL 22GX3.5 QUINCKE BK (NEEDLE) ×1 IMPLANT
NEEDLE 22X1 1/2 (OR ONLY) (NEEDLE) ×3 IMPLANT
NEEDLE SPNL 22GX3.5 QUINCKE BK (NEEDLE) ×3 IMPLANT
NS IRRIG 1000ML POUR BTL (IV SOLUTION) ×3 IMPLANT
PAD ARMBOARD 7.5X6 YLW CONV (MISCELLANEOUS) ×6 IMPLANT
PEN SKIN MARKING BROAD (MISCELLANEOUS) ×1 IMPLANT
SCALPEL HARMONIC ACE (MISCELLANEOUS) IMPLANT
SCISSORS LAP 5X35 DISP (ENDOMECHANICALS) IMPLANT
SET IRRIG TUBING LAPAROSCOPIC (IRRIGATION / IRRIGATOR) IMPLANT
SPONGE GAUZE 2X2 STER 10/PKG (GAUZE/BANDAGES/DRESSINGS) ×1
SUT MNCRL AB 4-0 PS2 18 (SUTURE) ×3 IMPLANT
SUT PROLENE 1 CT (SUTURE) ×4 IMPLANT
SUT VICRYL 0 UR6 27IN ABS (SUTURE) ×2 IMPLANT
TACKER 5MM HERNIA 3.5CML NAB (ENDOMECHANICALS) ×1 IMPLANT
TOWEL OR 17X24 6PK STRL BLUE (TOWEL DISPOSABLE) ×1 IMPLANT
TOWEL OR 17X26 10 PK STRL BLUE (TOWEL DISPOSABLE) ×3 IMPLANT
TRAY FOLEY CATH 14FRSI W/METER (CATHETERS) IMPLANT
TRAY LAPAROSCOPIC (CUSTOM PROCEDURE TRAY) ×3 IMPLANT
TROCAR FALLER TUNNELING (TROCAR) ×1 IMPLANT
TROCAR XCEL NON-BLD 5MMX100MML (ENDOMECHANICALS) ×3 IMPLANT
TROCAR Z-THREAD FIOS 11X100 BL (TROCAR) ×2 IMPLANT
TROCAR Z-THREAD FIOS 5X100MM (TROCAR) ×5 IMPLANT

## 2011-10-11 NOTE — Anesthesia Preprocedure Evaluation (Addendum)
Anesthesia Evaluation  Patient identified by MRN, date of birth, ID band Patient awake    Reviewed: Allergy & Precautions, H&P , NPO status , Patient's Chart, lab work & pertinent test results, reviewed documented beta blocker date and time   Airway Mallampati: II  Neck ROM: Full    Dental  (+) Teeth Intact and Caps   Pulmonary sleep apnea and Continuous Positive Airway Pressure Ventilation ,  breath sounds clear to auscultation        Cardiovascular Rhythm:Regular Rate:Normal     Neuro/Psych    GI/Hepatic hiatal hernia, Medicated,  Endo/Other  Hypothyroidism   Renal/GU      Musculoskeletal   Abdominal   Peds  Hematology   Anesthesia Other Findings   Reproductive/Obstetrics                        Anesthesia Physical Anesthesia Plan  ASA: II  Anesthesia Plan: General   Post-op Pain Management:    Induction: Intravenous  Airway Management Planned: Oral ETT  Additional Equipment:   Intra-op Plan:   Post-operative Plan: Extubation in OR  Informed Consent: I have reviewed the patients History and Physical, chart, labs and discussed the procedure including the risks, benefits and alternatives for the proposed anesthesia with the patient or authorized representative who has indicated his/her understanding and acceptance.   Dental advisory given  Plan Discussed with: CRNA, Anesthesiologist and Surgeon  Anesthesia Plan Comments:         Anesthesia Quick Evaluation

## 2011-10-11 NOTE — Preoperative (Signed)
Beta Blockers   Reason not to administer Beta Blockers:Not Applicable 

## 2011-10-11 NOTE — Anesthesia Procedure Notes (Signed)
Procedure Name: Intubation Date/Time: 10/11/2011 11:45 AM Performed by: Glendora Score A Pre-anesthesia Checklist: Patient identified, Emergency Drugs available, Suction available and Patient being monitored Patient Re-evaluated:Patient Re-evaluated prior to inductionOxygen Delivery Method: Circle system utilized Preoxygenation: Pre-oxygenation with 100% oxygen Intubation Type: IV induction Ventilation: Mask ventilation without difficulty and Oral airway inserted - appropriate to patient size Laryngoscope Size: Hyacinth Meeker and 2 Grade View: Grade I Tube type: Oral Tube size: 7.5 mm Number of attempts: 1 Airway Equipment and Method: Stylet Placement Confirmation: ETT inserted through vocal cords under direct vision,  positive ETCO2 and breath sounds checked- equal and bilateral Secured at: 23 cm Tube secured with: Tape Dental Injury: Teeth and Oropharynx as per pre-operative assessment

## 2011-10-11 NOTE — H&P (View-Only) (Signed)
Subjective:     Patient ID: Douglas [name], male   DOB: 09-26-1940, 71 y.o.   MRN: [id number]  HPI   Douglas [name]  [id number]  Patient Care Team: Ezequiel Kayser, MD as PCP - General (Internal Medicine)  This patient is a 71 y.o.male who presents today for surgical evaluation.   Procedure: Laparoscopic bilateral inguinal hernia repairs. Laparoscopic periumbilical ventral herniorrhaphy with mesh 04/19/2011  The patient comes in today feeling okay.  However, he noticed a lump at his bellybutton again.  It is soft.  It is not particularly large.  He think it may have gotten larger.  It is not very painful.  But it does concern him given the fact that he had his umbilical hernia start this way in a similar fashion.  Eating well.  Weight is stable.  He denies gaining weight or any other changes.  1lb weight gain in 4 months.  No infections.  Patient Active Problem List  Diagnosis  . ANEMIA, IRON DEFICIENCY  . GERD  . Hiatal hernia  . Diastasis recti  . Umbilical mass, possible recurrent umbilical hernia    Past Medical History  Diagnosis Date  . Thyroid disease   . GERD (gastroesophageal reflux disease)   . Hyperlipidemia   . Osteoporosis   . Bilateral inguinal hernia   . Anemia     Past Surgical History  Procedure Date  . Cholecystectomy   . Fibula fracture surgery     fibula and tibia fracture   . Hemorrhoid surgery   . Anal fissure repair   . Laparoscopic inguinal hernia repair 04/19/11    bilateral  . Hernia repair 04/19/11    bilateral    History   Social History  . Marital Status: Married    Spouse Name: N/A    Number of Children: N/A  . Years of Education: N/A   Occupational History  . Not on file.   Social History Main Topics  . Smoking status: Former Smoker    Quit date: 06/05/1998  . Smokeless tobacco: Never Used  . Alcohol Use: No  . Drug Use: No  . Sexually Active: Not on file   Other Topics Concern  . Not on file   Social  History Narrative  . No narrative on file    Family History  Problem Relation Age of Onset  . Stroke Mother   . Stroke Father     Current outpatient prescriptions:alendronate (FOSAMAX) 5 MG tablet, Take 5 mg by mouth daily before breakfast. Take with a full glass of water on an empty stomach. , Disp: , Rfl: ;  cetirizine (ZYRTEC) 5 MG tablet, Take 5 mg by mouth daily.  , Disp: , Rfl: ;  fish oil-omega-3 fatty acids 1000 MG capsule, Take 2 g by mouth daily.  , Disp: , Rfl: ;  Levothyroxine Sodium (SYNTHROID PO), Take by mouth.  , Disp: , Rfl:  Multiple Vitamin (MULTIVITAMIN PO), Take by mouth daily.  , Disp: , Rfl: ;  simvastatin (ZOCOR) 80 MG tablet, Take 80 mg by mouth at bedtime.  , Disp: , Rfl:   No Known Allergies  BP 152/84  Pulse 60  Temp 98 F (36.7 C) (Temporal)  Resp 16  Ht 5\' 10"  (1.778 m)  Wt 185 lb 12.8 oz (84.278 kg)  BMI 26.66 kg/m2     Review of Systems  Constitutional: Negative for fever, chills and diaphoresis.  HENT: Negative for sore throat, trouble swallowing and neck pain.  Eyes: Negative for photophobia and visual disturbance.  Respiratory: Negative for choking and shortness of breath.   Cardiovascular: Negative for chest pain and palpitations.  Gastrointestinal: Negative for nausea, vomiting, abdominal distention, anal bleeding and rectal pain.  Genitourinary: Negative for dysuria, urgency, difficulty urinating and testicular pain.  Musculoskeletal: Negative for myalgias, arthralgias and gait problem.  Skin: Negative for color change and rash.  Neurological: Negative for dizziness, speech difficulty, weakness and numbness.  Hematological: Negative for adenopathy.  Psychiatric/Behavioral: Negative for hallucinations, confusion and agitation.       Objective:   Physical Exam  Constitutional: He is oriented to person, place, and time. He appears well-developed and well-nourished. No distress.  HENT:  Head: Normocephalic.  Mouth/Throat: Oropharynx  is clear and moist. No oropharyngeal exudate.  Eyes: Conjunctivae and EOM are normal. Pupils are equal, round, and reactive to light. No scleral icterus.  Neck: Normal range of motion. No tracheal deviation present.  Cardiovascular: Normal rate, normal heart sounds and intact distal pulses.   Pulmonary/Chest: Effort normal. No respiratory distress.  Abdominal: Soft. He exhibits no distension. There is no tenderness. Hernia confirmed negative in the right inguinal area and confirmed negative in the left inguinal area.       Incisions clean with normal healing ridges.  Soft mobile 1.5cm mass in right upper part of umbilicus.  Possible hernia underneath.  Diastasis recti unchanged  Musculoskeletal: Normal range of motion. He exhibits no tenderness.  Neurological: He is alert and oriented to person, place, and time. No cranial nerve deficit. He exhibits normal muscle tone. Coordination normal.  Skin: Skin is warm and dry. No rash noted. He is not diaphoretic.  Psychiatric: He has a normal mood and affect. His behavior is normal.       Assessment:     Umbilical mobile mass, hernia recurrence vs old hernia sac    Plan:     Do not know if the primary repair broke down and this is just a preperitoneal mass coming through.  Perhaps the mesh is contracted.  Perhaps this is a small lipoma.  Nonetheless, I think it is reasonable to do diagnostic laparoscopy to landed see what is going on.  If all he has is a local hernia recurrence but the mesh underneath is intact I would just remove the mass.  If he has a true hernia recurrence he may need another piece of mesh in place.  The anatomy & physiology of the abdominal wall was discussed.  The pathophysiology of hernias was discussed.  Natural history risks without surgery including progeressive enlargement, pain, incarceration & strangulation was discussed.   Contributors to complications such as smoking, obesity, diabetes, prior surgery, etc were discussed.     I feel the risks of no intervention will lead to serious problems that outweigh the operative risks; therefore, I recommended surgery to reduce and repair the hernia.  I explained laparoscopic techniques with possible need for an open approach.  I noted the probable use of mesh to patch and/or buttress the hernia repair  Risks such as bleeding, infection, abscess, need for further treatment, heart attack, death, and other risks were discussed.  I noted a good likelihood this will help address the problem.   Goals of post-operative recovery were discussed as well.  Possibility that this will not correct all symptoms was explained.  I stressed the importance of low-impact activity, aggressive pain control, avoiding constipation, & not pushing through pain to minimize risk of post-operative chronic pain or injury. Possibility  of reherniation especially with smoking, obesity, diabetes, immunosuppression, and other health conditions was discussed.  We will work to minimize complications.     An educational handout further explaining the pathology & treatment options was given as well.  Questions were answered.  The patient expresses understanding & wishes to proceed with surgery.

## 2011-10-11 NOTE — Anesthesia Postprocedure Evaluation (Signed)
  Anesthesia Post-op Note  Patient: Douglas [name]  Procedure(s) Performed: Procedure(s) (LRB): LAPAROSCOPY DIAGNOSTIC (N/A) EXCISION MASS (N/A)  Patient Location: PACU  Anesthesia Type: General  Level of Consciousness: awake  Airway and Oxygen Therapy: Patient Spontanous Breathing  Post-op Pain: mild  Post-op Assessment: Post-op Vital signs reviewed  Post-op Vital Signs: stable  Complications: No apparent anesthesia complications

## 2011-10-11 NOTE — Interval H&P Note (Signed)
History and Physical Interval Note:  10/11/2011 11:20 AM  Douglas [name]  has presented today for surgery, with the diagnosis of UMBILICAL MASS, POSSIBLE RECURRENT UMBILICAL HERNIA  The various methods of treatment have been discussed with the patient and family. After consideration of risks, benefits and other options for treatment, the patient has consented to  Procedure(s) (LRB): LAPAROSCOPIC UMBILICAL HERNIA (N/A) INSERTION OF MESH (N/A) as a surgical intervention .  The patient's history has been reviewed, patient examined, no change in status, stable for surgery.  I have reviewed the patient's chart and labs.  Questions were answered to the patient's satisfaction.     [name] C.

## 2011-10-11 NOTE — Op Note (Signed)
10/11/2011  12:39 PM  PATIENT:  Douglas [name]  71 y.o. male  Patient Care Team: Ezequiel Kayser, MD as PCP - General (Internal Medicine)  PRE-OPERATIVE DIAGNOSIS:  UMBILICAL MASS, POSSIBLE RECURRENT UMBILICAL incisional HERNIA  POST-OPERATIVE DIAGNOSIS:  UMBILICAL MASS, POSSIBLE RECURRENT UMBILICAL HERNIA  PROCEDURE:  Procedure(s): LAPAROSCOPY DIAGNOSTIC with lysis of adhesions EXCISION MASS umbilical stalk with primary repair  SURGEON:  Surgeon(s): Ardeth Sportsman, MD  ASSISTANT: none   ANESTHESIA:   local and general  EBL:  Total I/O In: 1000 [I.V.:1000] Out: -   Delay start of Pharmacological VTE agent (>24hrs) due to surgical blood loss or risk of bleeding:  no  DRAINS: none   SPECIMEN:  No Specimen  DISPOSITION OF SPECIMEN:  PATHOLOGY  COUNTS:  YES  PLAN OF CARE: Discharge to home after PACU  PATIENT DISPOSITION:  PACU - hemodynamically stable.  INDICATION: Pleasant obese male with a ventral wall incisional hernia supraumbilical.  He also had MRAs.  I repaired these laparoscopically with mesh in January 2013.  He recovered.  He noticed a lump there is bellybutton that was concern about a hernia recurrence.  Had some discomfort as well.  I offered diagnostic laparoscopy with possible repair:  The anatomy & physiology of the abdominal wall was discussed.  The pathophysiology of hernias was discussed.  Natural history risks without surgery including progeressive enlargement, pain, incarceration & strangulation was discussed.   Contributors to complications such as smoking, obesity, diabetes, prior surgery, etc were discussed.   I feel the risks of no intervention will lead to serious problems that outweigh the operative risks; therefore, I recommended surgery to reduce and repair the hernia.  I explained laparoscopic techniques with possible need for an open approach.  I noted the probable use of mesh to patch and/or buttress the hernia repair  Risks such as  bleeding, infection, abscess, need for further treatment, heart attack, death, and other risks were discussed.  I noted a good likelihood this will help address the problem.   Goals of post-operative recovery were discussed as well.  Possibility that this will not correct all symptoms was explained.  I stressed the importance of low-impact activity, aggressive pain control, avoiding constipation, & not pushing through pain to minimize risk of post-operative chronic pain or injury. Possibility of reherniation especially with smoking, obesity, diabetes, immunosuppression, and other health conditions was discussed.  We will work to minimize complications.     An educational handout further explaining the pathology & treatment options was given as well.  Questions were answered.  The patient expresses understanding & wishes to proceed with surgery.  OR FINDINGS: VWH mesh intact with moderate omental adhesions.  No evidence of hernia recurrence.  Thickened umbilical stalk with small fascial laxity consistent with a small 1 cm umbilical hernia within the stalk but covered by the mesh  DESCRIPTION: Informed consent was confirmed.  The patient underwent general anaesthesia without difficulty.  The patient was positioned appropriately.  VTE prevention in place.  The patient's abdomen was clipped, prepped, & draped in a sterile fashion.  Surgical timeout confirmed our plan.  The patient was positioned in reverse Trendelenburg.  Abdominal entry was gained using optical entry technique in the left upper abdomen.  Entry was clean.  I induced carbon dioxide insufflation.  Camera inspection revealed no injury.  Extra port was carefully placed under direct laparoscopic visualization In the left paramedian region.  He had moderate omental adhesions to the old mesh in the central abdomen.  I carefully freed those off using cold scissors and blunt dissection.  They came off rather easily.  The mesh laid intact.  There is  no evidence of any mesh contraction nor recurrence.  I could feel his area of concern and this was well covered by mesh.  This is a not consistent with recurrence.  I went ahead and made a curvilinear incision around the inferior umbilical fold.  IAnnitta Needs off the umbilical stalk off the fascia.  The stalk was somewhat thickened and contracted consistent with an old hernia sac.  There was a 1 cm fascial defect.  I decided to primarily close it transversely with 0 Vicryl stitches.  I redid diagnostic laparoscopy and there was no evidence of stitches involving the abdominal cavity.  Hemostasis was excellent.  I evacuated carbon dioxide.  I closed the skin at the port sites and umbilicus using 4-0 Monocryl stitch.  Steri-Strips is applied.  Patient is extubated.  He is in the recovery room in stable condition.  About discussed operative findings with the patient's family.

## 2011-10-11 NOTE — Transfer of Care (Signed)
Immediate Anesthesia Transfer of Care Note  Patient: Douglas [name]  Procedure(s) Performed: Procedure(s) (LRB): LAPAROSCOPY DIAGNOSTIC (N/A) EXCISION MASS (N/A)  Patient Location: PACU  Anesthesia Type: General  Level of Consciousness: awake, alert , oriented and patient cooperative  Airway & Oxygen Therapy: Patient Spontanous Breathing and Patient connected to face mask oxygen  Post-op Assessment: Report given to PACU RN and Post -op Vital signs reviewed and stable  Post vital signs: Reviewed and stable  Complications: No apparent anesthesia complications

## 2011-10-14 ENCOUNTER — Encounter (HOSPITAL_COMMUNITY): Payer: Self-pay | Admitting: Surgery

## 2011-10-24 DIAGNOSIS — E291 Testicular hypofunction: Secondary | ICD-10-CM | POA: Diagnosis not present

## 2011-11-04 ENCOUNTER — Encounter (INDEPENDENT_AMBULATORY_CARE_PROVIDER_SITE_OTHER): Payer: Self-pay | Admitting: Surgery

## 2011-11-04 ENCOUNTER — Ambulatory Visit (INDEPENDENT_AMBULATORY_CARE_PROVIDER_SITE_OTHER): Payer: Medicare Other | Admitting: Surgery

## 2011-11-04 VITALS — BP 146/82 | HR 71 | Temp 97.8°F | Ht 70.0 in | Wt 186.6 lb

## 2011-11-04 DIAGNOSIS — R1909 Other intra-abdominal and pelvic swelling, mass and lump: Secondary | ICD-10-CM

## 2011-11-04 DIAGNOSIS — M6200 Separation of muscle (nontraumatic), unspecified site: Secondary | ICD-10-CM

## 2011-11-04 DIAGNOSIS — M6208 Separation of muscle (nontraumatic), other site: Secondary | ICD-10-CM

## 2011-11-04 NOTE — Progress Notes (Signed)
Subjective:     Patient ID: Douglas [name], male   DOB: 08-14-1940, 71 y.o.   MRN: [id number]  HPI  FURIOUS CHIARELLI  [birth year] [id number]  Patient Care Team: Ezequiel Kayser, MD as PCP - General (Internal Medicine)  This patient is a 71 y.o.male who presents today for surgical evaluation.   Procedure: Laparoscopic lysis of adhesions and exploration.  Abdominal wall exploration with excision of hernia sac and primary umbilical hernia repair with mesh 10/11/2011  The patient comes in today now three weeks out.  He feels well.  No fevers or chills.  Back to regular activity.  Regular bowel movements.  He feels good  Patient Active Problem List  Diagnosis  . ANEMIA, IRON DEFICIENCY  . GERD  . Hiatal hernia  . Diastasis recti    Past Medical History  Diagnosis Date  . Thyroid disease   . GERD (gastroesophageal reflux disease)   . Hyperlipidemia   . Osteoporosis   . Bilateral inguinal hernia   . Anemia   . Hypothyroidism   . Sleep apnea     CPAP q night - study done 8-10 yrs. ago   . Chronic kidney disease     h/o- renal calculi- passed spont. , 10 episodes over the course of 20 yrs.   . Testosterone deficiency     injections q 3 week- July 8- last injection     Past Surgical History  Procedure Date  . Cholecystectomy   . Fibula fracture surgery     fibula and tibia fracture   . Hemorrhoid surgery   . Anal fissure repair   . Laparoscopic inguinal hernia repair 04/19/11    bilateral  . Hernia repair 04/19/11    bilateral  . Fracture surgery     L leg -plate & screws - ORIF- fibula  . Laparoscopy 10/11/2011    Procedure: LAPAROSCOPY DIAGNOSTIC;  Surgeon: Ardeth Sportsman, MD;  Location: [facility] OR;  Service: General;  Laterality: N/A;  UMBILICAL EXPLORATION  . Mass excision 10/11/2011    Procedure: EXCISION MASS;  Surgeon: Ardeth Sportsman, MD;  Location: [facility] OR;  Service: General;  Laterality: N/A;  EXCISION OF UMBILICAL MASS    History   Social History  . Marital Status:  Married    Spouse Name: N/A    Number of Children: N/A  . Years of Education: N/A   Occupational History  . Not on file.   Social History Main Topics  . Smoking status: Former Smoker    Quit date: 06/05/1998  . Smokeless tobacco: Never Used  . Alcohol Use: No  . Drug Use: No  . Sexually Active: Not on file   Other Topics Concern  . Not on file   Social History Narrative  . No narrative on file    Family History  Problem Relation Age of Onset  . Stroke Mother   . Stroke Father     Current Outpatient Prescriptions  Medication Sig Dispense Refill  . alendronate (FOSAMAX) 70 MG tablet Take 70 mg by mouth every 7 (seven) days. Take with a full glass of water on an empty stomach. Take on Sundays      . cetirizine (ZYRTEC) 5 MG tablet Take 10 mg by mouth daily.       . cholecalciferol (VITAMIN D) 1000 UNITS tablet Take 1,000 Units by mouth 2 (two) times daily.      . fish oil-omega-3 fatty acids 1000 MG capsule Take 1 g by mouth 4 (four)  times daily.       Marland Kitchen levothyroxine (SYNTHROID, LEVOTHROID) 112 MCG tablet Take 112 mcg by mouth daily.      . Multiple Vitamin (MULTIVITAMIN PO) Take by mouth daily.        . simvastatin (ZOCOR) 40 MG tablet Take 40 mg by mouth every evening.         No Known Allergies  BP 146/82  Pulse 71  Temp 97.8 F (36.6 C) (Temporal)  Ht 5\' 10"  (1.778 m)  Wt 186 lb 9.6 oz (84.641 kg)  BMI 26.77 kg/m2  SpO2 96%  No results found.   Review of Systems  Constitutional: Negative for fever, chills and diaphoresis.  HENT: Negative for sore throat, trouble swallowing and neck pain.   Eyes: Negative for photophobia and visual disturbance.  Respiratory: Negative for choking and shortness of breath.   Cardiovascular: Negative for chest pain and palpitations.  Gastrointestinal: Negative for nausea, vomiting, abdominal distention, anal bleeding and rectal pain.  Genitourinary: Negative for dysuria, urgency, difficulty urinating and testicular pain.    Musculoskeletal: Negative for myalgias, arthralgias and gait problem.  Skin: Negative for color change and rash.  Neurological: Negative for dizziness, speech difficulty, weakness and numbness.  Hematological: Negative for adenopathy.  Psychiatric/Behavioral: Negative for hallucinations, confusion and agitation.       Objective:   Physical Exam  Constitutional: He is oriented to person, place, and time. He appears well-developed and well-nourished. No distress.  HENT:  Head: Normocephalic.  Mouth/Throat: Oropharynx is clear and moist. No oropharyngeal exudate.  Eyes: Conjunctivae and EOM are normal. Pupils are equal, round, and reactive to light. No scleral icterus.  Neck: Normal range of motion. No tracheal deviation present.  Cardiovascular: Normal rate, normal heart sounds and intact distal pulses.   Pulmonary/Chest: Effort normal. No respiratory distress.  Abdominal: Soft. He exhibits no distension. There is no tenderness. Hernia confirmed negative in the right inguinal area and confirmed negative in the left inguinal area.       Incisions clean with normal healing ridges.  No hernias  Musculoskeletal: Normal range of motion. He exhibits no tenderness.  Neurological: He is alert and oriented to person, place, and time. No cranial nerve deficit. He exhibits normal muscle tone. Coordination normal.  Skin: Skin is warm and dry. No rash noted. He is not diaphoretic.  Psychiatric: He has a normal mood and affect. His behavior is normal.       Assessment:     3 weeks s/p excision of old hernia sac w no evidence of hernia recurrence    Plan:     Increase activity as tolerated.  Do not push through pain.  Advanced on diet as tolerated. Bowel regimen to avoid problems.  Return to clinic p.r.n. The patient expressed understanding and appreciation

## 2011-11-04 NOTE — Patient Instructions (Addendum)
HERNIA REPAIR: POST OP INSTRUCTIONS  1. DIET: Follow a light bland diet the first 24 hours after arrival home, such as soup, liquids, crackers, etc.  Be sure to include lots of fluids daily.  Avoid fast food or heavy meals as your are more likely to get nauseated.  Eat a low fat the next few days after surgery. 2. Take your usually prescribed home medications unless otherwise directed. 3. PAIN CONTROL: a. Pain is best controlled by a usual combination of three different methods TOGETHER: i. Ice/Heat ii. Over the counter pain medication iii. Prescription pain medication b. Most patients will experience some swelling and bruising around the hernia(s) such as the bellybutton, groins, or old incisions.  Ice packs or heating pads (30-60 minutes up to 6 times a day) will help. Use ice for the first few days to help decrease swelling and bruising, then switch to heat to help relax tight/sore spots and speed recovery.  Some people prefer to use ice alone, heat alone, alternating between ice & heat.  Experiment to what works for you.  Swelling and bruising can take several weeks to resolve.   c. It is helpful to take an over-the-counter pain medication regularly for the first few weeks.  Choose one of the following that works best for you: i. Naproxen (Aleve, etc)  Two 220mg tabs twice a day ii. Ibuprofen (Advil, etc) Three 200mg tabs four times a day (every meal & bedtime) iii. Acetaminophen (Tylenol, etc) 325-650mg four times a day (every meal & bedtime) d. A  prescription for pain medication should be given to you upon discharge.  Take your pain medication as prescribed.  i. If you are having problems/concerns with the prescription medicine (does not control pain, nausea, vomiting, rash, itching, etc), please call us [phone] to see if we need to switch you to a different pain medicine that will work better for you and/or control your side effect better. ii. If you need a refill on your pain  medication, please contact your pharmacy.  They will contact our office to request authorization. Prescriptions will not be filled after 5 pm or on week-ends. 4. Avoid getting constipated.  Between the surgery and the pain medications, it is common to experience some constipation.  Increasing fluid intake and taking a fiber supplement (such as Metamucil, Citrucel, FiberCon, MiraLax, etc) 1-2 times a day regularly will usually help prevent this problem from occurring.  A mild laxative (prune juice, Milk of Magnesia, MiraLax, etc) should be taken according to package directions if there are no bowel movements after 48 hours.   5. Wash / shower every day.  You may shower over the dressings as they are waterproof.   6. Remove your waterproof bandages 5 days after surgery.  You may leave the incision open to air.  You may replace a dressing/Band-Aid to cover the incision for comfort if you wish.  Continue to shower over incision(s) after the dressing is off.    7. ACTIVITIES as tolerated:   a. You may resume regular (light) daily activities beginning the next day-such as daily self-care, walking, climbing stairs-gradually increasing activities as tolerated.  If you can walk 30 minutes without difficulty, it is safe to try more intense activity such as jogging, treadmill, bicycling, low-impact aerobics, swimming, etc. b. Save the most intensive and strenuous activity for last such as sit-ups, heavy lifting, contact sports, etc  Refrain from any heavy lifting or straining until you are off narcotics for pain control.     c. DO NOT PUSH THROUGH PAIN.  Let pain be your guide: If it hurts to do something, don't do it.  Pain is your body warning you to avoid that activity for another week until the pain goes down. d. You may drive when you are no longer taking prescription pain medication, you can comfortably wear a seatbelt, and you can safely maneuver your car and apply brakes. e. You may have sexual intercourse  when it is comfortable.  8. FOLLOW UP in our office a. Please call [facility] at [phone] to set up an appointment to see your surgeon in the office for a follow-up appointment approximately 2-3 weeks after your surgery. b. Make sure that you call for this appointment the day you arrive home to insure a convenient appointment time. 9.  IF YOU HAVE DISABILITY OR FAMILY LEAVE FORMS, BRING THEM TO THE OFFICE FOR PROCESSING.  DO NOT GIVE THEM TO YOUR DOCTOR.  WHEN TO CALL US [phone]: 1. Poor pain control 2. Reactions / problems with new medications (rash/itching, nausea, etc)  3. Fever over 101.5 F (38.5 C) 4. Inability to urinate 5. Nausea and/or vomiting 6. Worsening swelling or bruising 7. Continued bleeding from incision. 8. Increased pain, redness, or drainage from the incision   The clinic staff is available to answer your questions during regular business hours (8:30am-5pm).  Please don't hesitate to call and ask to speak to one of our nurses for clinical concerns.   If you have a medical emergency, go to the nearest emergency room or call 911.  A surgeon from [facility] is always on call at the hospitals in [place]  [facility] [address], [address], [place], [place]  [address] ?  [address] [address], [place], [place]   [address] MAIN: [phone] ? TOLL FREE: [phone] ? FAX: [phone] [email or web address]  

## 2011-11-12 DIAGNOSIS — E291 Testicular hypofunction: Secondary | ICD-10-CM | POA: Diagnosis not present

## 2011-12-05 DIAGNOSIS — E291 Testicular hypofunction: Secondary | ICD-10-CM | POA: Diagnosis not present

## 2011-12-16 DIAGNOSIS — I10 Essential (primary) hypertension: Secondary | ICD-10-CM | POA: Diagnosis not present

## 2011-12-16 DIAGNOSIS — R7301 Impaired fasting glucose: Secondary | ICD-10-CM | POA: Diagnosis not present

## 2011-12-16 DIAGNOSIS — E291 Testicular hypofunction: Secondary | ICD-10-CM | POA: Diagnosis not present

## 2011-12-16 DIAGNOSIS — E039 Hypothyroidism, unspecified: Secondary | ICD-10-CM | POA: Diagnosis not present

## 2011-12-26 DIAGNOSIS — E291 Testicular hypofunction: Secondary | ICD-10-CM | POA: Diagnosis not present

## 2012-01-16 DIAGNOSIS — R7301 Impaired fasting glucose: Secondary | ICD-10-CM | POA: Diagnosis not present

## 2012-01-16 DIAGNOSIS — R030 Elevated blood-pressure reading, without diagnosis of hypertension: Secondary | ICD-10-CM | POA: Diagnosis not present

## 2012-01-16 DIAGNOSIS — E291 Testicular hypofunction: Secondary | ICD-10-CM | POA: Diagnosis not present

## 2012-01-27 DIAGNOSIS — H66009 Acute suppurative otitis media without spontaneous rupture of ear drum, unspecified ear: Secondary | ICD-10-CM | POA: Diagnosis not present

## 2012-01-27 DIAGNOSIS — H6980 Other specified disorders of Eustachian tube, unspecified ear: Secondary | ICD-10-CM | POA: Diagnosis not present

## 2012-01-30 DIAGNOSIS — R030 Elevated blood-pressure reading, without diagnosis of hypertension: Secondary | ICD-10-CM | POA: Diagnosis not present

## 2012-02-04 DIAGNOSIS — E291 Testicular hypofunction: Secondary | ICD-10-CM | POA: Diagnosis not present

## 2012-02-26 DIAGNOSIS — E291 Testicular hypofunction: Secondary | ICD-10-CM | POA: Diagnosis not present

## 2012-03-10 DIAGNOSIS — H40019 Open angle with borderline findings, low risk, unspecified eye: Secondary | ICD-10-CM | POA: Diagnosis not present

## 2012-03-20 DIAGNOSIS — E291 Testicular hypofunction: Secondary | ICD-10-CM | POA: Diagnosis not present

## 2012-04-02 DIAGNOSIS — L819 Disorder of pigmentation, unspecified: Secondary | ICD-10-CM | POA: Diagnosis not present

## 2012-04-02 DIAGNOSIS — L821 Other seborrheic keratosis: Secondary | ICD-10-CM | POA: Diagnosis not present

## 2012-04-02 DIAGNOSIS — L738 Other specified follicular disorders: Secondary | ICD-10-CM | POA: Diagnosis not present

## 2012-04-02 DIAGNOSIS — D239 Other benign neoplasm of skin, unspecified: Secondary | ICD-10-CM | POA: Diagnosis not present

## 2012-04-02 DIAGNOSIS — D1801 Hemangioma of skin and subcutaneous tissue: Secondary | ICD-10-CM | POA: Diagnosis not present

## 2012-04-16 DIAGNOSIS — E291 Testicular hypofunction: Secondary | ICD-10-CM | POA: Diagnosis not present

## 2012-04-29 DIAGNOSIS — H6980 Other specified disorders of Eustachian tube, unspecified ear: Secondary | ICD-10-CM | POA: Diagnosis not present

## 2012-04-29 DIAGNOSIS — H6120 Impacted cerumen, unspecified ear: Secondary | ICD-10-CM | POA: Diagnosis not present

## 2012-04-29 DIAGNOSIS — H902 Conductive hearing loss, unspecified: Secondary | ICD-10-CM | POA: Diagnosis not present

## 2012-05-14 DIAGNOSIS — E291 Testicular hypofunction: Secondary | ICD-10-CM | POA: Diagnosis not present

## 2012-05-18 DIAGNOSIS — H903 Sensorineural hearing loss, bilateral: Secondary | ICD-10-CM | POA: Diagnosis not present

## 2012-06-04 DIAGNOSIS — E291 Testicular hypofunction: Secondary | ICD-10-CM | POA: Diagnosis not present

## 2012-06-16 DIAGNOSIS — M810 Age-related osteoporosis without current pathological fracture: Secondary | ICD-10-CM | POA: Diagnosis not present

## 2012-06-16 DIAGNOSIS — E559 Vitamin D deficiency, unspecified: Secondary | ICD-10-CM | POA: Diagnosis not present

## 2012-06-16 DIAGNOSIS — I10 Essential (primary) hypertension: Secondary | ICD-10-CM | POA: Diagnosis not present

## 2012-06-16 DIAGNOSIS — R7301 Impaired fasting glucose: Secondary | ICD-10-CM | POA: Diagnosis not present

## 2012-06-16 DIAGNOSIS — E785 Hyperlipidemia, unspecified: Secondary | ICD-10-CM | POA: Diagnosis not present

## 2012-06-16 DIAGNOSIS — E039 Hypothyroidism, unspecified: Secondary | ICD-10-CM | POA: Diagnosis not present

## 2012-06-16 DIAGNOSIS — Z125 Encounter for screening for malignant neoplasm of prostate: Secondary | ICD-10-CM | POA: Diagnosis not present

## 2012-06-23 DIAGNOSIS — D649 Anemia, unspecified: Secondary | ICD-10-CM | POA: Diagnosis not present

## 2012-06-23 DIAGNOSIS — R7301 Impaired fasting glucose: Secondary | ICD-10-CM | POA: Diagnosis not present

## 2012-06-23 DIAGNOSIS — Z0000 Encounter for general adult medical examination without abnormal findings: Secondary | ICD-10-CM | POA: Diagnosis not present

## 2012-06-23 DIAGNOSIS — E559 Vitamin D deficiency, unspecified: Secondary | ICD-10-CM | POA: Diagnosis not present

## 2012-06-23 DIAGNOSIS — E049 Nontoxic goiter, unspecified: Secondary | ICD-10-CM | POA: Diagnosis not present

## 2012-06-23 DIAGNOSIS — M810 Age-related osteoporosis without current pathological fracture: Secondary | ICD-10-CM | POA: Diagnosis not present

## 2012-06-23 DIAGNOSIS — E291 Testicular hypofunction: Secondary | ICD-10-CM | POA: Diagnosis not present

## 2012-06-23 DIAGNOSIS — G4733 Obstructive sleep apnea (adult) (pediatric): Secondary | ICD-10-CM | POA: Diagnosis not present

## 2012-06-23 DIAGNOSIS — I10 Essential (primary) hypertension: Secondary | ICD-10-CM | POA: Diagnosis not present

## 2012-06-23 DIAGNOSIS — Z125 Encounter for screening for malignant neoplasm of prostate: Secondary | ICD-10-CM | POA: Diagnosis not present

## 2012-07-14 DIAGNOSIS — M810 Age-related osteoporosis without current pathological fracture: Secondary | ICD-10-CM | POA: Diagnosis not present

## 2012-07-14 DIAGNOSIS — E291 Testicular hypofunction: Secondary | ICD-10-CM | POA: Diagnosis not present

## 2012-08-04 DIAGNOSIS — Z79899 Other long term (current) drug therapy: Secondary | ICD-10-CM | POA: Diagnosis not present

## 2012-08-04 DIAGNOSIS — E291 Testicular hypofunction: Secondary | ICD-10-CM | POA: Diagnosis not present

## 2012-08-27 DIAGNOSIS — E291 Testicular hypofunction: Secondary | ICD-10-CM | POA: Diagnosis not present

## 2012-09-14 DIAGNOSIS — H40019 Open angle with borderline findings, low risk, unspecified eye: Secondary | ICD-10-CM | POA: Diagnosis not present

## 2012-09-14 DIAGNOSIS — H21239 Degeneration of iris (pigmentary), unspecified eye: Secondary | ICD-10-CM | POA: Diagnosis not present

## 2012-09-22 DIAGNOSIS — E291 Testicular hypofunction: Secondary | ICD-10-CM | POA: Diagnosis not present

## 2012-10-20 DIAGNOSIS — E291 Testicular hypofunction: Secondary | ICD-10-CM | POA: Diagnosis not present

## 2012-11-11 DIAGNOSIS — I10 Essential (primary) hypertension: Secondary | ICD-10-CM | POA: Diagnosis not present

## 2012-11-11 DIAGNOSIS — H6120 Impacted cerumen, unspecified ear: Secondary | ICD-10-CM | POA: Diagnosis not present

## 2012-11-11 DIAGNOSIS — E291 Testicular hypofunction: Secondary | ICD-10-CM | POA: Diagnosis not present

## 2012-12-03 DIAGNOSIS — E291 Testicular hypofunction: Secondary | ICD-10-CM | POA: Diagnosis not present

## 2013-02-03 DIAGNOSIS — R232 Flushing: Secondary | ICD-10-CM | POA: Diagnosis not present

## 2013-02-03 DIAGNOSIS — E291 Testicular hypofunction: Secondary | ICD-10-CM | POA: Diagnosis not present

## 2013-02-03 DIAGNOSIS — Z6826 Body mass index (BMI) 26.0-26.9, adult: Secondary | ICD-10-CM | POA: Diagnosis not present

## 2013-03-24 DIAGNOSIS — H40019 Open angle with borderline findings, low risk, unspecified eye: Secondary | ICD-10-CM | POA: Diagnosis not present

## 2013-04-08 DIAGNOSIS — L719 Rosacea, unspecified: Secondary | ICD-10-CM | POA: Diagnosis not present

## 2013-04-08 DIAGNOSIS — L738 Other specified follicular disorders: Secondary | ICD-10-CM | POA: Diagnosis not present

## 2013-04-08 DIAGNOSIS — L259 Unspecified contact dermatitis, unspecified cause: Secondary | ICD-10-CM | POA: Diagnosis not present

## 2013-04-08 DIAGNOSIS — D1801 Hemangioma of skin and subcutaneous tissue: Secondary | ICD-10-CM | POA: Diagnosis not present

## 2013-04-08 DIAGNOSIS — L570 Actinic keratosis: Secondary | ICD-10-CM | POA: Diagnosis not present

## 2013-04-08 DIAGNOSIS — L819 Disorder of pigmentation, unspecified: Secondary | ICD-10-CM | POA: Diagnosis not present

## 2013-04-08 DIAGNOSIS — T148 Other injury of unspecified body region: Secondary | ICD-10-CM | POA: Diagnosis not present

## 2013-04-08 DIAGNOSIS — L821 Other seborrheic keratosis: Secondary | ICD-10-CM | POA: Diagnosis not present

## 2013-04-28 DIAGNOSIS — H518 Other specified disorders of binocular movement: Secondary | ICD-10-CM | POA: Diagnosis not present

## 2013-05-12 IMAGING — CT CT ABD-PELV W/ CM
2 of 5 series · 17 of 46 positions shown, 19 images · IV contrast (READICAT/WATER & [ID] OMNI 300)
Comparison: June 15, 2010

***ADDENDUM*** CREATED: 03/21/2011 [DATE]

BUN and creatinine were obtained on site at [HOSPITAL] at
[HOSPITAL].
Results:  BUN 6.0 mg/dL,  Creatinine 1.2 mg/dL.
***END ADDENDUM*** SIGNED BY: Hoi Wing Andara, M.D.
CLINICAL DATA: Upper abdominal wall hernia
CT ABDOMEN AND PELVIS WITH CONTRAST
TECHNIQUE: Multidetector CT imaging of the abdomen and pelvis was
performed following the standard protocol during bolus
administration of intravenous contrast.
Contrast: 100mL OMNIPAQUE IOHEXOL 300 MG/ML IV SOLN

[Series 2: abd/pelvis with · axial · 0.74mm/px · z∈[-370,+35]mm · 14 of 91 slices shown, 16 images]
[im 5/91  soft-tissue]
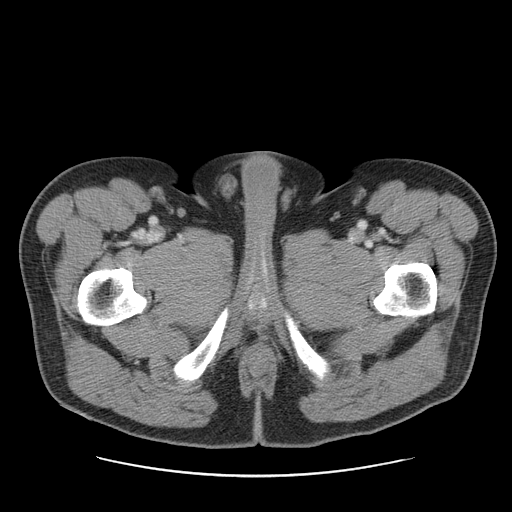
[im 5/91  bone]
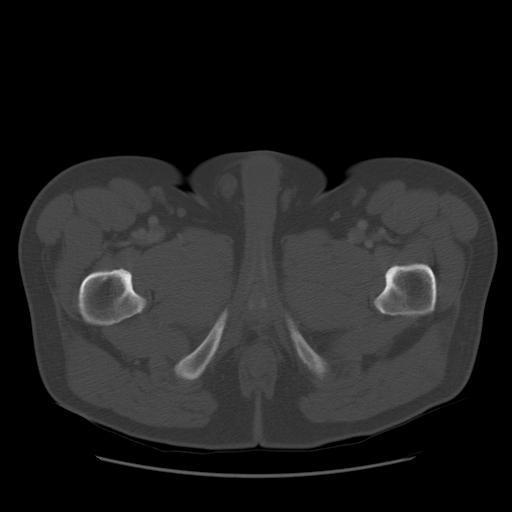
[im 14/91  soft-tissue]
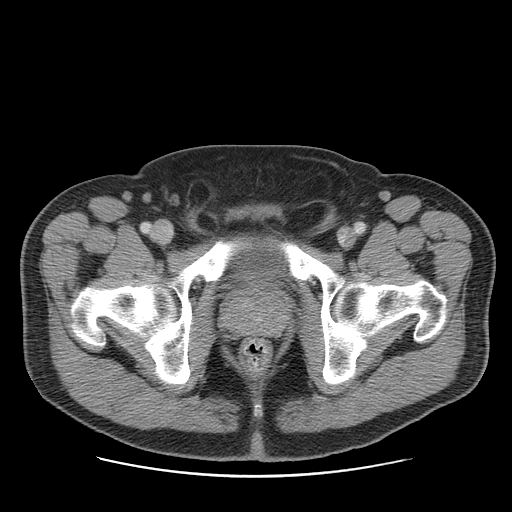
[im 19/91  soft-tissue]
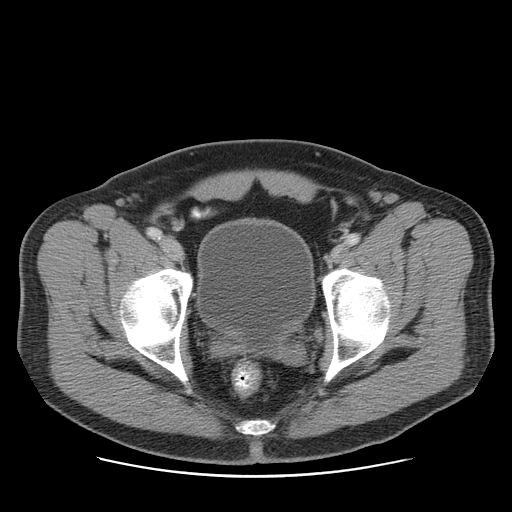
[im 23/91  soft-tissue]
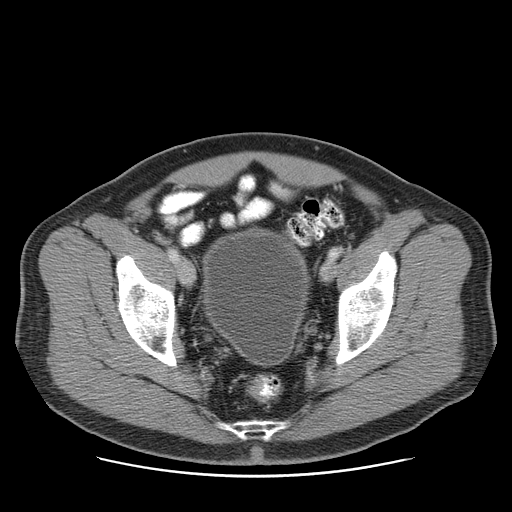
[im 32/91  soft-tissue]
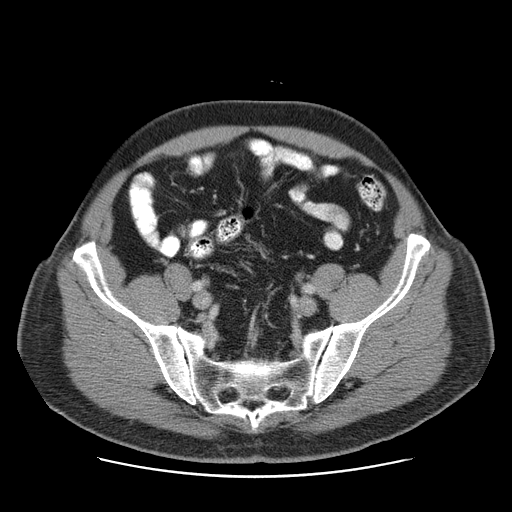
[im 37/91  soft-tissue]
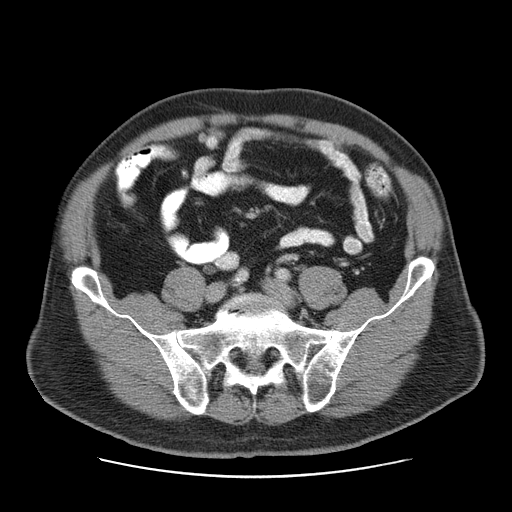
[im 41/91  soft-tissue]
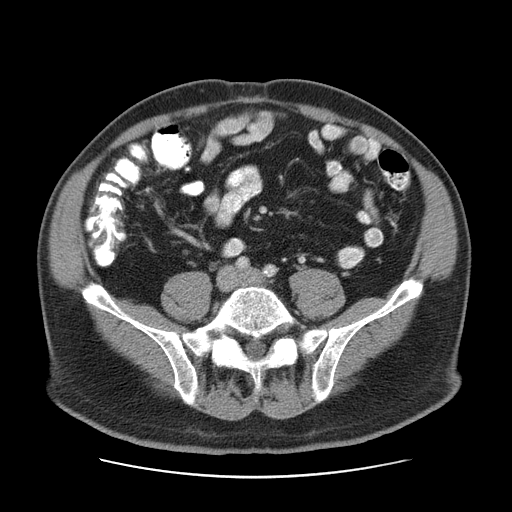
[im 50/91  soft-tissue]
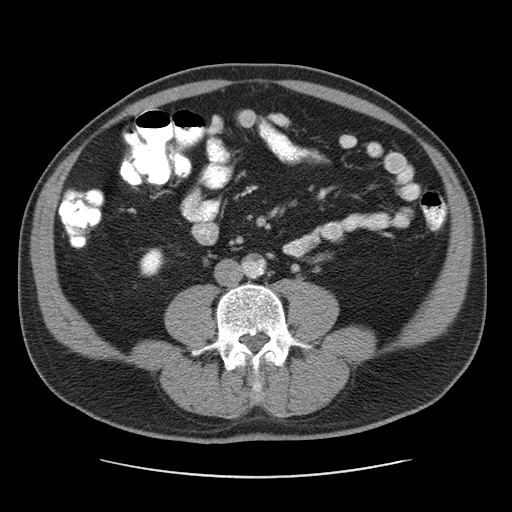
[im 55/91  soft-tissue]
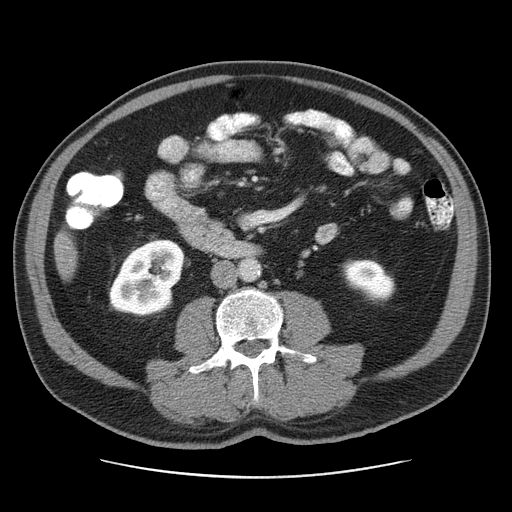
[im 55/91  bone]
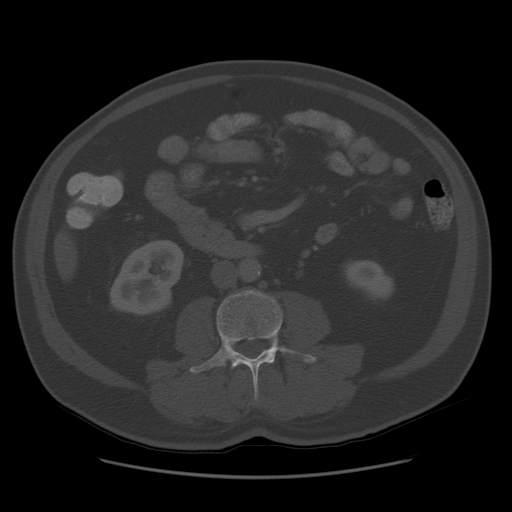
[im 59/91  soft-tissue]
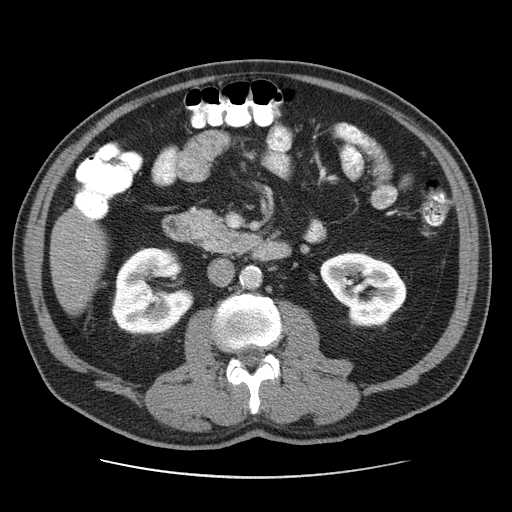
[im 68/91  soft-tissue]
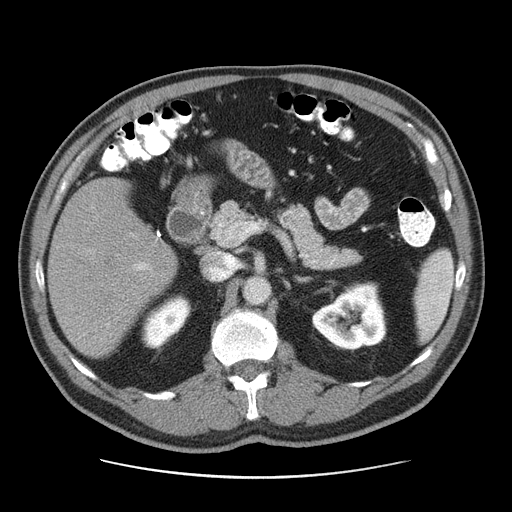
[im 73/91  soft-tissue]
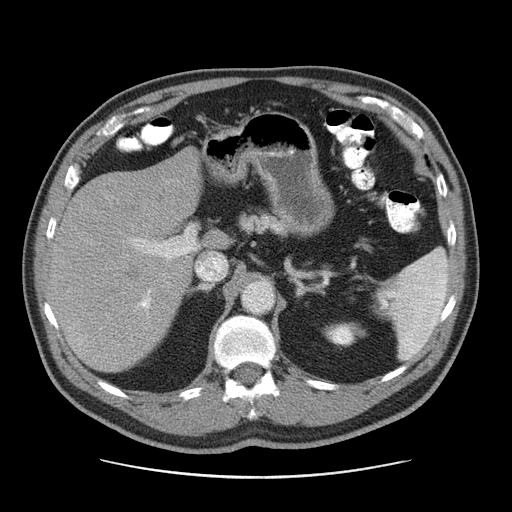
[im 77/91  soft-tissue]
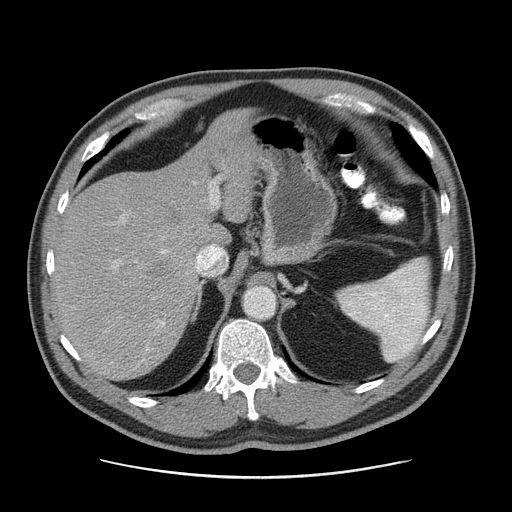
[im 86/91  soft-tissue]
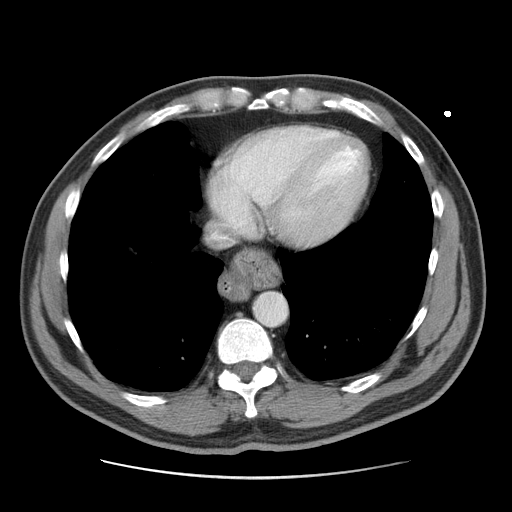

[Series 400: cor · coronal · 0.90mm/px · 3 of 129 slices shown]
[im 43/129  soft-tissue]
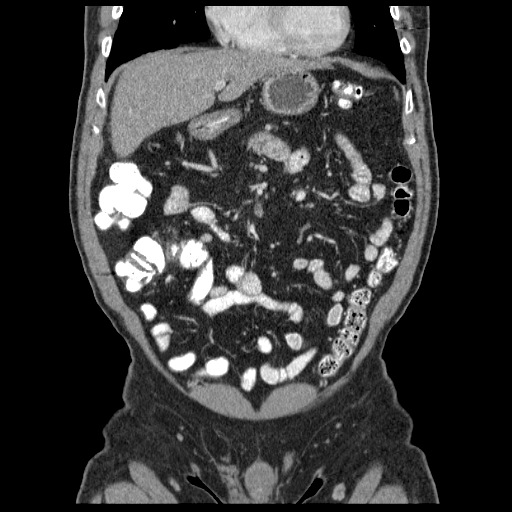
[im 57/129  soft-tissue]
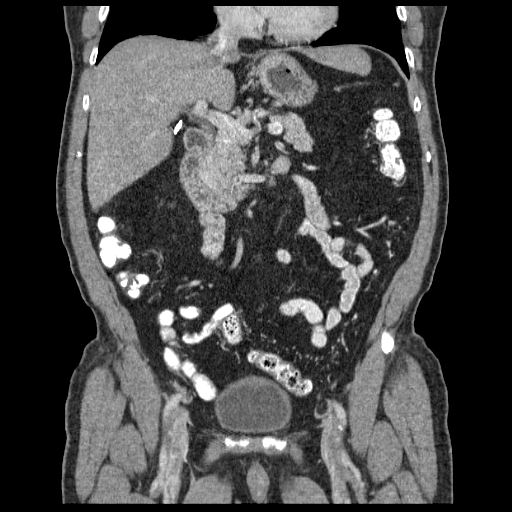
[im 72/129  soft-tissue]
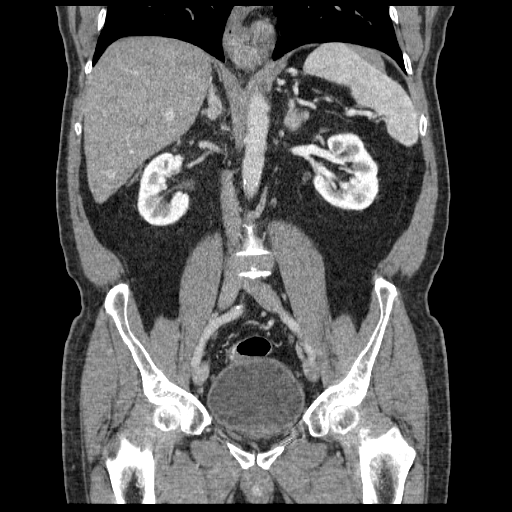

[17 of 46 positions shown; findings below may reference images not displayed]

FINDINGS: A 6 cm bleb in the  anterior right lower lobe is stable.
The gallbladder has been surgically removed.  Moderate sized hiatal
hernia appears stable.  A tiny hepatic cyst is unchanged.  The
spleen, pancreas, adrenal glands, urinary bladder have a normal
appearance.  Small nonobstructing renal calyceal calculi are stable
bilaterally.  The appendix is seen in the mid right abdomen and has
a normal appearance.  There is no evidence of anterior abdominal
wall hernia.  There is a small right inguinal hernia containing fat
only.  The bowel is unremarkable with no evidence of gross
obstruction or inflammation.  There is no adenopathy or free fluid
within the abdomen or pelvis.  There are no osseous lesions.
IMPRESSION: There is a moderate sized hiatal hernia which is stable in size and
appearance.

Small right inguinal hernia containing fat only, unchanged.

6 cm right lower lobe bleb, unchanged.

Stable bilateral nonobstructing renal calyceal calculi.

## 2013-05-13 DIAGNOSIS — H518 Other specified disorders of binocular movement: Secondary | ICD-10-CM | POA: Diagnosis not present

## 2013-05-19 DIAGNOSIS — H903 Sensorineural hearing loss, bilateral: Secondary | ICD-10-CM | POA: Diagnosis not present

## 2013-06-29 DIAGNOSIS — Z125 Encounter for screening for malignant neoplasm of prostate: Secondary | ICD-10-CM | POA: Diagnosis not present

## 2013-06-29 DIAGNOSIS — E291 Testicular hypofunction: Secondary | ICD-10-CM | POA: Diagnosis not present

## 2013-06-29 DIAGNOSIS — E785 Hyperlipidemia, unspecified: Secondary | ICD-10-CM | POA: Diagnosis not present

## 2013-06-29 DIAGNOSIS — I10 Essential (primary) hypertension: Secondary | ICD-10-CM | POA: Diagnosis not present

## 2013-06-29 DIAGNOSIS — E559 Vitamin D deficiency, unspecified: Secondary | ICD-10-CM | POA: Diagnosis not present

## 2013-06-29 DIAGNOSIS — E039 Hypothyroidism, unspecified: Secondary | ICD-10-CM | POA: Diagnosis not present

## 2013-06-29 DIAGNOSIS — R7301 Impaired fasting glucose: Secondary | ICD-10-CM | POA: Diagnosis not present

## 2013-07-06 DIAGNOSIS — E785 Hyperlipidemia, unspecified: Secondary | ICD-10-CM | POA: Diagnosis not present

## 2013-07-06 DIAGNOSIS — Z1331 Encounter for screening for depression: Secondary | ICD-10-CM | POA: Diagnosis not present

## 2013-07-06 DIAGNOSIS — R7301 Impaired fasting glucose: Secondary | ICD-10-CM | POA: Diagnosis not present

## 2013-07-06 DIAGNOSIS — I10 Essential (primary) hypertension: Secondary | ICD-10-CM | POA: Diagnosis not present

## 2013-07-06 DIAGNOSIS — Z0000 Encounter for general adult medical examination without abnormal findings: Secondary | ICD-10-CM | POA: Diagnosis not present

## 2013-07-06 DIAGNOSIS — E291 Testicular hypofunction: Secondary | ICD-10-CM | POA: Diagnosis not present

## 2013-07-06 DIAGNOSIS — E559 Vitamin D deficiency, unspecified: Secondary | ICD-10-CM | POA: Diagnosis not present

## 2013-07-06 DIAGNOSIS — M810 Age-related osteoporosis without current pathological fracture: Secondary | ICD-10-CM | POA: Diagnosis not present

## 2013-07-06 DIAGNOSIS — Z125 Encounter for screening for malignant neoplasm of prostate: Secondary | ICD-10-CM | POA: Diagnosis not present

## 2013-07-06 DIAGNOSIS — E039 Hypothyroidism, unspecified: Secondary | ICD-10-CM | POA: Diagnosis not present

## 2013-07-07 DIAGNOSIS — Z1212 Encounter for screening for malignant neoplasm of rectum: Secondary | ICD-10-CM | POA: Diagnosis not present

## 2013-08-23 DIAGNOSIS — Z23 Encounter for immunization: Secondary | ICD-10-CM | POA: Diagnosis not present

## 2013-08-23 DIAGNOSIS — I10 Essential (primary) hypertension: Secondary | ICD-10-CM | POA: Diagnosis not present

## 2013-08-23 DIAGNOSIS — E291 Testicular hypofunction: Secondary | ICD-10-CM | POA: Diagnosis not present

## 2013-11-03 DIAGNOSIS — H5210 Myopia, unspecified eye: Secondary | ICD-10-CM | POA: Diagnosis not present

## 2013-11-03 DIAGNOSIS — H5000 Unspecified esotropia: Secondary | ICD-10-CM | POA: Diagnosis not present

## 2013-11-03 DIAGNOSIS — H21239 Degeneration of iris (pigmentary), unspecified eye: Secondary | ICD-10-CM | POA: Diagnosis not present

## 2014-06-08 ENCOUNTER — Encounter: Payer: Self-pay | Admitting: Gastroenterology

## 2014-07-13 DIAGNOSIS — E039 Hypothyroidism, unspecified: Secondary | ICD-10-CM | POA: Diagnosis not present

## 2014-07-13 DIAGNOSIS — E785 Hyperlipidemia, unspecified: Secondary | ICD-10-CM | POA: Diagnosis not present

## 2014-07-13 DIAGNOSIS — Z125 Encounter for screening for malignant neoplasm of prostate: Secondary | ICD-10-CM | POA: Diagnosis not present

## 2014-07-13 DIAGNOSIS — I10 Essential (primary) hypertension: Secondary | ICD-10-CM | POA: Diagnosis not present

## 2014-07-13 DIAGNOSIS — R7301 Impaired fasting glucose: Secondary | ICD-10-CM | POA: Diagnosis not present

## 2014-07-13 DIAGNOSIS — M810 Age-related osteoporosis without current pathological fracture: Secondary | ICD-10-CM | POA: Diagnosis not present

## 2014-07-19 DIAGNOSIS — H903 Sensorineural hearing loss, bilateral: Secondary | ICD-10-CM | POA: Diagnosis not present

## 2014-07-20 DIAGNOSIS — Z1212 Encounter for screening for malignant neoplasm of rectum: Secondary | ICD-10-CM | POA: Diagnosis not present

## 2014-07-20 DIAGNOSIS — Z6827 Body mass index (BMI) 27.0-27.9, adult: Secondary | ICD-10-CM | POA: Diagnosis not present

## 2014-07-20 DIAGNOSIS — E291 Testicular hypofunction: Secondary | ICD-10-CM | POA: Diagnosis not present

## 2014-07-20 DIAGNOSIS — Z0000 Encounter for general adult medical examination without abnormal findings: Secondary | ICD-10-CM | POA: Diagnosis not present

## 2014-07-20 DIAGNOSIS — M810 Age-related osteoporosis without current pathological fracture: Secondary | ICD-10-CM | POA: Diagnosis not present

## 2014-07-20 DIAGNOSIS — E119 Type 2 diabetes mellitus without complications: Secondary | ICD-10-CM | POA: Diagnosis not present

## 2014-07-20 DIAGNOSIS — Z1389 Encounter for screening for other disorder: Secondary | ICD-10-CM | POA: Diagnosis not present

## 2014-07-20 DIAGNOSIS — I10 Essential (primary) hypertension: Secondary | ICD-10-CM | POA: Diagnosis not present

## 2014-07-20 DIAGNOSIS — E039 Hypothyroidism, unspecified: Secondary | ICD-10-CM | POA: Diagnosis not present

## 2014-07-20 DIAGNOSIS — D649 Anemia, unspecified: Secondary | ICD-10-CM | POA: Diagnosis not present

## 2014-07-20 DIAGNOSIS — E559 Vitamin D deficiency, unspecified: Secondary | ICD-10-CM | POA: Diagnosis not present

## 2014-07-20 DIAGNOSIS — K469 Unspecified abdominal hernia without obstruction or gangrene: Secondary | ICD-10-CM | POA: Diagnosis not present

## 2014-07-20 DIAGNOSIS — K219 Gastro-esophageal reflux disease without esophagitis: Secondary | ICD-10-CM | POA: Diagnosis not present

## 2014-07-20 DIAGNOSIS — G4733 Obstructive sleep apnea (adult) (pediatric): Secondary | ICD-10-CM | POA: Diagnosis not present

## 2014-07-26 ENCOUNTER — Encounter: Payer: Self-pay | Admitting: Gastroenterology

## 2014-08-25 ENCOUNTER — Ambulatory Visit (AMBULATORY_SURGERY_CENTER): Payer: Self-pay

## 2014-08-25 VITALS — Ht 70.0 in | Wt 185.0 lb

## 2014-08-25 DIAGNOSIS — Z860100 Personal history of colon polyps, unspecified: Secondary | ICD-10-CM

## 2014-08-25 NOTE — Progress Notes (Signed)
No allergies to eggs or soy No diet/weight loss meds No home oxygen No past problems with anesthesia  Has email  Emmi instructions given for colonoscopy 

## 2014-08-30 ENCOUNTER — Encounter: Payer: Self-pay | Admitting: Gastroenterology

## 2014-09-08 ENCOUNTER — Encounter: Payer: Self-pay | Admitting: Gastroenterology

## 2014-09-08 ENCOUNTER — Ambulatory Visit (AMBULATORY_SURGERY_CENTER): Payer: Medicare Other | Admitting: Gastroenterology

## 2014-09-08 VITALS — BP 127/83 | HR 65 | Temp 97.5°F | Resp 11 | Ht 70.0 in | Wt 185.0 lb

## 2014-09-08 DIAGNOSIS — D124 Benign neoplasm of descending colon: Secondary | ICD-10-CM | POA: Diagnosis not present

## 2014-09-08 DIAGNOSIS — Z86010 Personal history of colonic polyps: Secondary | ICD-10-CM

## 2014-09-08 MED ORDER — SODIUM CHLORIDE 0.9 % IV SOLN: 500.0000 mL | INTRAVENOUS | Status: DC

## 2014-09-08 NOTE — Op Note (Signed)
[place]  [address] [place], [address]   COLONOSCOPY PROCEDURE REPORT  PATIENT: Douglas [name], Douglas [name]  MR#: [id number] BIRTHDATE: 21-Jan-1941 , 73  yrs. old GENDER: male ENDOSCOPIST: Ladene Artist, MD, [facility] PROCEDURE DATE:  09/08/2014 PROCEDURE:   Colonoscopy, surveillance and Colonoscopy with snare polypectomy First Screening Colonoscopy - Avg.  risk and is 50 yrs.  old or older - No.  Prior Negative Screening - Now for repeat screening. N/A  History of Adenoma - Now for follow-up colonoscopy & has been > or = to 3 yrs.  Yes hx of adenoma.  Has been 3 or more years since last colonoscopy.  Polyps removed today? Yes ASA CLASS:   Class II INDICATIONS:Surveillance due to prior colonic neoplasia and PH Colon Adenoma. MEDICATIONS: Monitored anesthesia care and Propofol 200 mg IV DESCRIPTION OF PROCEDURE:   After the risks benefits and alternatives of the procedure were thoroughly explained, informed consent was obtained.  The digital rectal exam revealed no abnormalities of the rectum.   The LB [id number]  endoscope was introduced through the anus and advanced to the cecum, which was identified by both the appendix and ileocecal valve. No adverse events experienced.   The quality of the prep was good.  (MiraLax was used)  The instrument was then slowly withdrawn as the colon was fully examined. Estimated blood loss is zero unless otherwise noted in this procedure report.    COLON FINDINGS: A sessile polyp measuring 6 mm in size was found in the descending colon.  A polypectomy was performed with a cold snare.  The resection was complete, the polyp tissue was completely retrieved and sent to histology.   The examination was otherwise normal.  Retroflexed views revealed internal Grade I hemorrhoids. The time to cecum = 2.3 Withdrawal time = 11.9   The scope was withdrawn and the procedure completed. COMPLICATIONS: There were no immediate  complications.  ENDOSCOPIC IMPRESSION: 1.   Sessile polyp in the descending colon; polypectomy performed with a cold snare 2.   Grade l internal hemorrhoids  RECOMMENDATIONS: 1.  Await pathology results 2.  Repeat colonoscopy in 5 years if polyp adenomatous; otherwise, given your age, you will not need another colonoscopy for colon cancer screening or polyp surveillance.  These types of tests usually stop around the age 28.  eSigned:  Ladene Artist, MD, [facility] 09/08/2014 2:53 PM   cc: Crist Infante, MD

## 2014-09-08 NOTE — Progress Notes (Signed)
  [facility] Post-op Note  Patient: Douglas [name]  Procedure(s) Performed: colonoscopy  Patient Location: [facility] - Recovery Area  Anesthesia Type: Deep Sedation/Propofol  Level of Consciousness: awake, oriented and patient cooperative  Airway and Oxygen Therapy: Patient Spontanous Breathing  Post-op Pain: none  Post-op Assessment:  Post-op Vital signs reviewed, Patient's Cardiovascular Status Stable, Respiratory Function Stable, Patent Airway, No signs of Nausea or vomiting and Pain level controlled  Post-op Vital Signs: Reviewed and stable  Complications: No apparent anesthesia complications and patient stable with O2 sats 96 on RA. No complaints voiced  Lauralie Blacksher E 2:57 PM

## 2014-09-08 NOTE — Progress Notes (Signed)
Called to room to assist during endoscopic procedure.  Patient ID and intended procedure confirmed with present staff. Received instructions for my participation in the procedure from the performing physician.  

## 2014-09-08 NOTE — Progress Notes (Signed)
No egg or soy allergy No issues with past sedation No home 02 No diet pills

## 2014-09-08 NOTE — Patient Instructions (Signed)
YOU HAD AN ENDOSCOPIC PROCEDURE TODAY AT [facility]:   Refer to the procedure report that was given to you for any specific questions about what was found during the examination.  If the procedure report does not answer your questions, please call your gastroenterologist to clarify.  If you requested that your care partner not be given the details of your procedure findings, then the procedure report has been included in a sealed envelope for you to review at your convenience later.  YOU SHOULD EXPECT: Some feelings of bloating in the abdomen. Passage of more gas than usual.  Walking can help get rid of the air that was put into your GI tract during the procedure and reduce the bloating. If you had a lower endoscopy (such as a colonoscopy or flexible sigmoidoscopy) you may notice spotting of blood in your stool or on the toilet paper. If you underwent a bowel prep for your procedure, you may not have a normal bowel movement for a few days.  Please Note:  You might notice some irritation and congestion in your nose or some drainage.  This is from the oxygen used during your procedure.  There is no need for concern and it should clear up in a day or so.  SYMPTOMS TO REPORT IMMEDIATELY:   Following lower endoscopy (colonoscopy or flexible sigmoidoscopy):  Excessive amounts of blood in the stool  Significant tenderness or worsening of abdominal pains  Swelling of the abdomen that is new, acute  Fever of 100F or higher    For urgent or emergent issues, a gastroenterologist can be reached at any hour by calling [phone].   DIET: Your first meal following the procedure should be a small meal and then it is ok to progress to your normal diet. Heavy or fried foods are harder to digest and may make you feel nauseous or bloated.  Likewise, meals heavy in dairy and vegetables can increase bloating.  Drink plenty of fluids but you should avoid alcoholic beverages for 24  hours.  ACTIVITY:  You should plan to take it easy for the rest of today and you should NOT DRIVE or use heavy machinery until tomorrow (because of the sedation medicines used during the test).    FOLLOW UP: Our staff will call the number listed on your records the next business day following your procedure to check on you and address any questions or concerns that you may have regarding the information given to you following your procedure. If we do not reach you, we will leave a message.  However, if you are feeling well and you are not experiencing any problems, there is no need to return our call.  We will assume that you have returned to your regular daily activities without incident.  If any biopsies were taken you will be contacted by phone or by letter within the next 1-3 weeks.  Please call us at [phone] if you have not heard about the biopsies in 3 weeks.    SIGNATURES/CONFIDENTIALITY: You and/or your care partner have signed paperwork which will be entered into your electronic medical record.  These signatures attest to the fact that that the information above on your After Visit Summary has been reviewed and is understood.  Full responsibility of the confidentiality of this discharge information lies with you and/or your care-partner.   INFORMATION ON POLYPS GIVEN TO YOU TODAY

## 2014-09-12 ENCOUNTER — Telehealth: Payer: Self-pay

## 2014-09-12 NOTE — Telephone Encounter (Signed)
  Follow up Call-  Call back number 09/08/2014  Post procedure Call Back phone  # [phone]  Permission to leave phone message Yes     Patient questions:  Do you have a fever, pain , or abdominal swelling? No. Pain Score  0 *  Have you tolerated food without any problems? Yes.    Have you been able to return to your normal activities? Yes.    Do you have any questions about your discharge instructions: Diet   No. Medications  No. Follow up visit  No.  Do you have questions or concerns about your Care? No.  Actions: * If pain score is 4 or above: No action needed, pain <4.

## 2014-09-15 ENCOUNTER — Encounter: Payer: Self-pay | Admitting: Gastroenterology

## 2014-09-29 DIAGNOSIS — D1801 Hemangioma of skin and subcutaneous tissue: Secondary | ICD-10-CM | POA: Diagnosis not present

## 2014-09-29 DIAGNOSIS — L821 Other seborrheic keratosis: Secondary | ICD-10-CM | POA: Diagnosis not present

## 2014-09-29 DIAGNOSIS — L812 Freckles: Secondary | ICD-10-CM | POA: Diagnosis not present

## 2014-10-13 DIAGNOSIS — E291 Testicular hypofunction: Secondary | ICD-10-CM | POA: Diagnosis not present

## 2014-11-15 DIAGNOSIS — H21233 Degeneration of iris (pigmentary), bilateral: Secondary | ICD-10-CM | POA: Diagnosis not present

## 2014-11-15 DIAGNOSIS — H40013 Open angle with borderline findings, low risk, bilateral: Secondary | ICD-10-CM | POA: Diagnosis not present

## 2014-11-15 DIAGNOSIS — H5000 Unspecified esotropia: Secondary | ICD-10-CM | POA: Diagnosis not present

## 2014-11-15 DIAGNOSIS — H5213 Myopia, bilateral: Secondary | ICD-10-CM | POA: Diagnosis not present

## 2015-01-18 DIAGNOSIS — E119 Type 2 diabetes mellitus without complications: Secondary | ICD-10-CM | POA: Diagnosis not present

## 2015-01-18 DIAGNOSIS — I10 Essential (primary) hypertension: Secondary | ICD-10-CM | POA: Diagnosis not present

## 2015-01-18 DIAGNOSIS — Z6826 Body mass index (BMI) 26.0-26.9, adult: Secondary | ICD-10-CM | POA: Diagnosis not present

## 2015-01-18 DIAGNOSIS — E785 Hyperlipidemia, unspecified: Secondary | ICD-10-CM | POA: Diagnosis not present

## 2015-01-18 DIAGNOSIS — M810 Age-related osteoporosis without current pathological fracture: Secondary | ICD-10-CM | POA: Diagnosis not present

## 2015-07-12 DIAGNOSIS — E119 Type 2 diabetes mellitus without complications: Secondary | ICD-10-CM | POA: Diagnosis not present

## 2015-07-12 DIAGNOSIS — E291 Testicular hypofunction: Secondary | ICD-10-CM | POA: Diagnosis not present

## 2015-07-12 DIAGNOSIS — E298 Other testicular dysfunction: Secondary | ICD-10-CM | POA: Diagnosis not present

## 2015-07-21 DIAGNOSIS — H903 Sensorineural hearing loss, bilateral: Secondary | ICD-10-CM | POA: Diagnosis not present

## 2015-08-02 DIAGNOSIS — E559 Vitamin D deficiency, unspecified: Secondary | ICD-10-CM | POA: Diagnosis not present

## 2015-08-02 DIAGNOSIS — I10 Essential (primary) hypertension: Secondary | ICD-10-CM | POA: Diagnosis not present

## 2015-08-02 DIAGNOSIS — E298 Other testicular dysfunction: Secondary | ICD-10-CM | POA: Diagnosis not present

## 2015-08-02 DIAGNOSIS — Z125 Encounter for screening for malignant neoplasm of prostate: Secondary | ICD-10-CM | POA: Diagnosis not present

## 2015-08-02 DIAGNOSIS — E038 Other specified hypothyroidism: Secondary | ICD-10-CM | POA: Diagnosis not present

## 2015-08-02 DIAGNOSIS — E784 Other hyperlipidemia: Secondary | ICD-10-CM | POA: Diagnosis not present

## 2015-08-02 DIAGNOSIS — E119 Type 2 diabetes mellitus without complications: Secondary | ICD-10-CM | POA: Diagnosis not present

## 2015-08-09 DIAGNOSIS — E038 Other specified hypothyroidism: Secondary | ICD-10-CM | POA: Diagnosis not present

## 2015-08-09 DIAGNOSIS — Z0000 Encounter for general adult medical examination without abnormal findings: Secondary | ICD-10-CM | POA: Diagnosis not present

## 2015-08-09 DIAGNOSIS — E119 Type 2 diabetes mellitus without complications: Secondary | ICD-10-CM | POA: Diagnosis not present

## 2015-08-09 DIAGNOSIS — Z1389 Encounter for screening for other disorder: Secondary | ICD-10-CM | POA: Diagnosis not present

## 2015-08-09 DIAGNOSIS — I10 Essential (primary) hypertension: Secondary | ICD-10-CM | POA: Diagnosis not present

## 2015-08-09 DIAGNOSIS — Z6827 Body mass index (BMI) 27.0-27.9, adult: Secondary | ICD-10-CM | POA: Diagnosis not present

## 2015-08-09 DIAGNOSIS — K469 Unspecified abdominal hernia without obstruction or gangrene: Secondary | ICD-10-CM | POA: Diagnosis not present

## 2015-08-09 DIAGNOSIS — G4733 Obstructive sleep apnea (adult) (pediatric): Secondary | ICD-10-CM | POA: Diagnosis not present

## 2015-08-09 DIAGNOSIS — E559 Vitamin D deficiency, unspecified: Secondary | ICD-10-CM | POA: Diagnosis not present

## 2015-08-09 DIAGNOSIS — M810 Age-related osteoporosis without current pathological fracture: Secondary | ICD-10-CM | POA: Diagnosis not present

## 2015-08-09 DIAGNOSIS — E298 Other testicular dysfunction: Secondary | ICD-10-CM | POA: Diagnosis not present

## 2015-08-09 DIAGNOSIS — D6489 Other specified anemias: Secondary | ICD-10-CM | POA: Diagnosis not present

## 2015-09-13 DIAGNOSIS — E298 Other testicular dysfunction: Secondary | ICD-10-CM | POA: Diagnosis not present

## 2015-10-04 DIAGNOSIS — E298 Other testicular dysfunction: Secondary | ICD-10-CM | POA: Diagnosis not present

## 2015-10-05 DIAGNOSIS — D1801 Hemangioma of skin and subcutaneous tissue: Secondary | ICD-10-CM | POA: Diagnosis not present

## 2015-10-05 DIAGNOSIS — L821 Other seborrheic keratosis: Secondary | ICD-10-CM | POA: Diagnosis not present

## 2015-10-05 DIAGNOSIS — L578 Other skin changes due to chronic exposure to nonionizing radiation: Secondary | ICD-10-CM | POA: Diagnosis not present

## 2015-10-05 DIAGNOSIS — L570 Actinic keratosis: Secondary | ICD-10-CM | POA: Diagnosis not present

## 2015-10-05 DIAGNOSIS — L738 Other specified follicular disorders: Secondary | ICD-10-CM | POA: Diagnosis not present

## 2015-10-05 DIAGNOSIS — L718 Other rosacea: Secondary | ICD-10-CM | POA: Diagnosis not present

## 2015-10-05 DIAGNOSIS — L812 Freckles: Secondary | ICD-10-CM | POA: Diagnosis not present

## 2015-10-10 DIAGNOSIS — Z6827 Body mass index (BMI) 27.0-27.9, adult: Secondary | ICD-10-CM | POA: Diagnosis not present

## 2015-10-10 DIAGNOSIS — R0781 Pleurodynia: Secondary | ICD-10-CM | POA: Diagnosis not present

## 2015-10-10 DIAGNOSIS — S299XXA Unspecified injury of thorax, initial encounter: Secondary | ICD-10-CM | POA: Diagnosis not present

## 2015-10-10 DIAGNOSIS — W108XXA Fall (on) (from) other stairs and steps, initial encounter: Secondary | ICD-10-CM | POA: Diagnosis not present

## 2015-10-25 DIAGNOSIS — E298 Other testicular dysfunction: Secondary | ICD-10-CM | POA: Diagnosis not present

## 2015-11-15 DIAGNOSIS — Z23 Encounter for immunization: Secondary | ICD-10-CM | POA: Diagnosis not present

## 2015-11-15 DIAGNOSIS — E298 Other testicular dysfunction: Secondary | ICD-10-CM | POA: Diagnosis not present

## 2015-12-05 DIAGNOSIS — E298 Other testicular dysfunction: Secondary | ICD-10-CM | POA: Diagnosis not present

## 2015-12-19 DIAGNOSIS — H40012 Open angle with borderline findings, low risk, left eye: Secondary | ICD-10-CM | POA: Diagnosis not present

## 2015-12-19 DIAGNOSIS — H21233 Degeneration of iris (pigmentary), bilateral: Secondary | ICD-10-CM | POA: Diagnosis not present

## 2015-12-19 DIAGNOSIS — H5213 Myopia, bilateral: Secondary | ICD-10-CM | POA: Diagnosis not present

## 2015-12-19 DIAGNOSIS — H40011 Open angle with borderline findings, low risk, right eye: Secondary | ICD-10-CM | POA: Diagnosis not present

## 2015-12-26 DIAGNOSIS — E298 Other testicular dysfunction: Secondary | ICD-10-CM | POA: Diagnosis not present

## 2016-02-06 DIAGNOSIS — E291 Testicular hypofunction: Secondary | ICD-10-CM | POA: Diagnosis not present

## 2016-02-06 DIAGNOSIS — E298 Other testicular dysfunction: Secondary | ICD-10-CM | POA: Diagnosis not present

## 2016-02-27 DIAGNOSIS — E298 Other testicular dysfunction: Secondary | ICD-10-CM | POA: Diagnosis not present

## 2016-03-21 DIAGNOSIS — E298 Other testicular dysfunction: Secondary | ICD-10-CM | POA: Diagnosis not present

## 2016-04-03 DIAGNOSIS — Z1389 Encounter for screening for other disorder: Secondary | ICD-10-CM | POA: Diagnosis not present

## 2016-04-03 DIAGNOSIS — E038 Other specified hypothyroidism: Secondary | ICD-10-CM | POA: Diagnosis not present

## 2016-04-03 DIAGNOSIS — E298 Other testicular dysfunction: Secondary | ICD-10-CM | POA: Diagnosis not present

## 2016-04-03 DIAGNOSIS — I10 Essential (primary) hypertension: Secondary | ICD-10-CM | POA: Diagnosis not present

## 2016-04-03 DIAGNOSIS — E119 Type 2 diabetes mellitus without complications: Secondary | ICD-10-CM | POA: Diagnosis not present

## 2016-04-03 DIAGNOSIS — Z6827 Body mass index (BMI) 27.0-27.9, adult: Secondary | ICD-10-CM | POA: Diagnosis not present

## 2016-04-16 DIAGNOSIS — E291 Testicular hypofunction: Secondary | ICD-10-CM | POA: Diagnosis not present

## 2016-05-08 DIAGNOSIS — E298 Other testicular dysfunction: Secondary | ICD-10-CM | POA: Diagnosis not present

## 2016-05-23 DIAGNOSIS — M25521 Pain in right elbow: Secondary | ICD-10-CM | POA: Diagnosis not present

## 2016-05-25 DIAGNOSIS — M25521 Pain in right elbow: Secondary | ICD-10-CM | POA: Diagnosis not present

## 2016-05-28 DIAGNOSIS — M25521 Pain in right elbow: Secondary | ICD-10-CM | POA: Diagnosis not present

## 2016-05-29 DIAGNOSIS — E298 Other testicular dysfunction: Secondary | ICD-10-CM | POA: Diagnosis not present

## 2016-06-03 DIAGNOSIS — G8918 Other acute postprocedural pain: Secondary | ICD-10-CM | POA: Diagnosis not present

## 2016-06-03 DIAGNOSIS — M66821 Spontaneous rupture of other tendons, right upper arm: Secondary | ICD-10-CM | POA: Diagnosis not present

## 2016-06-03 DIAGNOSIS — S46201A Unspecified injury of muscle, fascia and tendon of other parts of biceps, right arm, initial encounter: Secondary | ICD-10-CM | POA: Diagnosis not present

## 2016-06-03 DIAGNOSIS — Y999 Unspecified external cause status: Secondary | ICD-10-CM | POA: Diagnosis not present

## 2016-06-17 DIAGNOSIS — M66821 Spontaneous rupture of other tendons, right upper arm: Secondary | ICD-10-CM | POA: Diagnosis not present

## 2016-06-20 DIAGNOSIS — E298 Other testicular dysfunction: Secondary | ICD-10-CM | POA: Diagnosis not present

## 2016-06-27 DIAGNOSIS — S46291D Other injury of muscle, fascia and tendon of other parts of biceps, right arm, subsequent encounter: Secondary | ICD-10-CM | POA: Diagnosis not present

## 2016-06-27 DIAGNOSIS — M25521 Pain in right elbow: Secondary | ICD-10-CM | POA: Diagnosis not present

## 2016-06-27 DIAGNOSIS — M25621 Stiffness of right elbow, not elsewhere classified: Secondary | ICD-10-CM | POA: Diagnosis not present

## 2016-07-03 DIAGNOSIS — M25621 Stiffness of right elbow, not elsewhere classified: Secondary | ICD-10-CM | POA: Diagnosis not present

## 2016-07-03 DIAGNOSIS — S46291D Other injury of muscle, fascia and tendon of other parts of biceps, right arm, subsequent encounter: Secondary | ICD-10-CM | POA: Diagnosis not present

## 2016-07-03 DIAGNOSIS — M25521 Pain in right elbow: Secondary | ICD-10-CM | POA: Diagnosis not present

## 2016-07-08 DIAGNOSIS — M25621 Stiffness of right elbow, not elsewhere classified: Secondary | ICD-10-CM | POA: Diagnosis not present

## 2016-07-08 DIAGNOSIS — M25521 Pain in right elbow: Secondary | ICD-10-CM | POA: Diagnosis not present

## 2016-07-08 DIAGNOSIS — S46291D Other injury of muscle, fascia and tendon of other parts of biceps, right arm, subsequent encounter: Secondary | ICD-10-CM | POA: Diagnosis not present

## 2016-07-09 DIAGNOSIS — S46291D Other injury of muscle, fascia and tendon of other parts of biceps, right arm, subsequent encounter: Secondary | ICD-10-CM | POA: Diagnosis not present

## 2016-07-11 DIAGNOSIS — E291 Testicular hypofunction: Secondary | ICD-10-CM | POA: Diagnosis not present

## 2016-07-15 DIAGNOSIS — M25521 Pain in right elbow: Secondary | ICD-10-CM | POA: Diagnosis not present

## 2016-07-15 DIAGNOSIS — S46291D Other injury of muscle, fascia and tendon of other parts of biceps, right arm, subsequent encounter: Secondary | ICD-10-CM | POA: Diagnosis not present

## 2016-07-15 DIAGNOSIS — M25621 Stiffness of right elbow, not elsewhere classified: Secondary | ICD-10-CM | POA: Diagnosis not present

## 2016-07-22 DIAGNOSIS — M25521 Pain in right elbow: Secondary | ICD-10-CM | POA: Diagnosis not present

## 2016-07-22 DIAGNOSIS — M25621 Stiffness of right elbow, not elsewhere classified: Secondary | ICD-10-CM | POA: Diagnosis not present

## 2016-07-22 DIAGNOSIS — S46291D Other injury of muscle, fascia and tendon of other parts of biceps, right arm, subsequent encounter: Secondary | ICD-10-CM | POA: Diagnosis not present

## 2016-07-29 DIAGNOSIS — M25621 Stiffness of right elbow, not elsewhere classified: Secondary | ICD-10-CM | POA: Diagnosis not present

## 2016-07-29 DIAGNOSIS — M25521 Pain in right elbow: Secondary | ICD-10-CM | POA: Diagnosis not present

## 2016-07-29 DIAGNOSIS — S46291D Other injury of muscle, fascia and tendon of other parts of biceps, right arm, subsequent encounter: Secondary | ICD-10-CM | POA: Diagnosis not present

## 2016-08-01 DIAGNOSIS — E291 Testicular hypofunction: Secondary | ICD-10-CM | POA: Diagnosis not present

## 2016-08-05 DIAGNOSIS — S46291D Other injury of muscle, fascia and tendon of other parts of biceps, right arm, subsequent encounter: Secondary | ICD-10-CM | POA: Diagnosis not present

## 2016-08-05 DIAGNOSIS — M25521 Pain in right elbow: Secondary | ICD-10-CM | POA: Diagnosis not present

## 2016-08-05 DIAGNOSIS — M25621 Stiffness of right elbow, not elsewhere classified: Secondary | ICD-10-CM | POA: Diagnosis not present

## 2016-08-06 DIAGNOSIS — S46291D Other injury of muscle, fascia and tendon of other parts of biceps, right arm, subsequent encounter: Secondary | ICD-10-CM | POA: Diagnosis not present

## 2016-08-14 DIAGNOSIS — M25521 Pain in right elbow: Secondary | ICD-10-CM | POA: Diagnosis not present

## 2016-08-14 DIAGNOSIS — S46291D Other injury of muscle, fascia and tendon of other parts of biceps, right arm, subsequent encounter: Secondary | ICD-10-CM | POA: Diagnosis not present

## 2016-08-14 DIAGNOSIS — M25621 Stiffness of right elbow, not elsewhere classified: Secondary | ICD-10-CM | POA: Diagnosis not present

## 2016-08-21 DIAGNOSIS — S46291D Other injury of muscle, fascia and tendon of other parts of biceps, right arm, subsequent encounter: Secondary | ICD-10-CM | POA: Diagnosis not present

## 2016-08-21 DIAGNOSIS — M25621 Stiffness of right elbow, not elsewhere classified: Secondary | ICD-10-CM | POA: Diagnosis not present

## 2016-08-21 DIAGNOSIS — M25521 Pain in right elbow: Secondary | ICD-10-CM | POA: Diagnosis not present

## 2016-08-22 DIAGNOSIS — E298 Other testicular dysfunction: Secondary | ICD-10-CM | POA: Diagnosis not present

## 2016-08-28 DIAGNOSIS — M25621 Stiffness of right elbow, not elsewhere classified: Secondary | ICD-10-CM | POA: Diagnosis not present

## 2016-08-28 DIAGNOSIS — S46291D Other injury of muscle, fascia and tendon of other parts of biceps, right arm, subsequent encounter: Secondary | ICD-10-CM | POA: Diagnosis not present

## 2016-08-28 DIAGNOSIS — M25521 Pain in right elbow: Secondary | ICD-10-CM | POA: Diagnosis not present

## 2016-09-03 DIAGNOSIS — M25621 Stiffness of right elbow, not elsewhere classified: Secondary | ICD-10-CM | POA: Diagnosis not present

## 2016-09-03 DIAGNOSIS — S46291D Other injury of muscle, fascia and tendon of other parts of biceps, right arm, subsequent encounter: Secondary | ICD-10-CM | POA: Diagnosis not present

## 2016-09-03 DIAGNOSIS — M25521 Pain in right elbow: Secondary | ICD-10-CM | POA: Diagnosis not present

## 2016-09-04 DIAGNOSIS — Z125 Encounter for screening for malignant neoplasm of prostate: Secondary | ICD-10-CM | POA: Diagnosis not present

## 2016-09-04 DIAGNOSIS — M810 Age-related osteoporosis without current pathological fracture: Secondary | ICD-10-CM | POA: Diagnosis not present

## 2016-09-04 DIAGNOSIS — E784 Other hyperlipidemia: Secondary | ICD-10-CM | POA: Diagnosis not present

## 2016-09-04 DIAGNOSIS — E119 Type 2 diabetes mellitus without complications: Secondary | ICD-10-CM | POA: Diagnosis not present

## 2016-09-04 DIAGNOSIS — E559 Vitamin D deficiency, unspecified: Secondary | ICD-10-CM | POA: Diagnosis not present

## 2016-09-04 DIAGNOSIS — I10 Essential (primary) hypertension: Secondary | ICD-10-CM | POA: Diagnosis not present

## 2016-09-04 DIAGNOSIS — E038 Other specified hypothyroidism: Secondary | ICD-10-CM | POA: Diagnosis not present

## 2016-09-10 DIAGNOSIS — S46291D Other injury of muscle, fascia and tendon of other parts of biceps, right arm, subsequent encounter: Secondary | ICD-10-CM | POA: Diagnosis not present

## 2016-09-10 DIAGNOSIS — M25521 Pain in right elbow: Secondary | ICD-10-CM | POA: Diagnosis not present

## 2016-09-10 DIAGNOSIS — M25621 Stiffness of right elbow, not elsewhere classified: Secondary | ICD-10-CM | POA: Diagnosis not present

## 2016-09-12 DIAGNOSIS — E784 Other hyperlipidemia: Secondary | ICD-10-CM | POA: Diagnosis not present

## 2016-09-12 DIAGNOSIS — Z6827 Body mass index (BMI) 27.0-27.9, adult: Secondary | ICD-10-CM | POA: Diagnosis not present

## 2016-09-12 DIAGNOSIS — Z0000 Encounter for general adult medical examination without abnormal findings: Secondary | ICD-10-CM | POA: Diagnosis not present

## 2016-09-12 DIAGNOSIS — M810 Age-related osteoporosis without current pathological fracture: Secondary | ICD-10-CM | POA: Diagnosis not present

## 2016-09-12 DIAGNOSIS — R0781 Pleurodynia: Secondary | ICD-10-CM | POA: Diagnosis not present

## 2016-09-12 DIAGNOSIS — G4733 Obstructive sleep apnea (adult) (pediatric): Secondary | ICD-10-CM | POA: Diagnosis not present

## 2016-09-12 DIAGNOSIS — Z23 Encounter for immunization: Secondary | ICD-10-CM | POA: Diagnosis not present

## 2016-09-12 DIAGNOSIS — E119 Type 2 diabetes mellitus without complications: Secondary | ICD-10-CM | POA: Diagnosis not present

## 2016-09-12 DIAGNOSIS — D6489 Other specified anemias: Secondary | ICD-10-CM | POA: Diagnosis not present

## 2016-09-12 DIAGNOSIS — E298 Other testicular dysfunction: Secondary | ICD-10-CM | POA: Diagnosis not present

## 2016-09-12 DIAGNOSIS — E559 Vitamin D deficiency, unspecified: Secondary | ICD-10-CM | POA: Diagnosis not present

## 2016-09-12 DIAGNOSIS — I10 Essential (primary) hypertension: Secondary | ICD-10-CM | POA: Diagnosis not present

## 2016-09-13 DIAGNOSIS — Z1212 Encounter for screening for malignant neoplasm of rectum: Secondary | ICD-10-CM | POA: Diagnosis not present

## 2016-09-17 DIAGNOSIS — M25521 Pain in right elbow: Secondary | ICD-10-CM | POA: Diagnosis not present

## 2016-10-02 DIAGNOSIS — E298 Other testicular dysfunction: Secondary | ICD-10-CM | POA: Diagnosis not present

## 2016-10-04 DIAGNOSIS — L812 Freckles: Secondary | ICD-10-CM | POA: Diagnosis not present

## 2016-10-04 DIAGNOSIS — L578 Other skin changes due to chronic exposure to nonionizing radiation: Secondary | ICD-10-CM | POA: Diagnosis not present

## 2016-10-04 DIAGNOSIS — D1801 Hemangioma of skin and subcutaneous tissue: Secondary | ICD-10-CM | POA: Diagnosis not present

## 2016-10-04 DIAGNOSIS — L570 Actinic keratosis: Secondary | ICD-10-CM | POA: Diagnosis not present

## 2016-10-04 DIAGNOSIS — L821 Other seborrheic keratosis: Secondary | ICD-10-CM | POA: Diagnosis not present

## 2016-10-24 DIAGNOSIS — E298 Other testicular dysfunction: Secondary | ICD-10-CM | POA: Diagnosis not present

## 2016-11-14 DIAGNOSIS — E291 Testicular hypofunction: Secondary | ICD-10-CM | POA: Diagnosis not present

## 2016-12-04 DIAGNOSIS — E298 Other testicular dysfunction: Secondary | ICD-10-CM | POA: Diagnosis not present

## 2016-12-20 DIAGNOSIS — Z23 Encounter for immunization: Secondary | ICD-10-CM | POA: Diagnosis not present

## 2016-12-20 DIAGNOSIS — H524 Presbyopia: Secondary | ICD-10-CM | POA: Diagnosis not present

## 2016-12-20 DIAGNOSIS — H40013 Open angle with borderline findings, low risk, bilateral: Secondary | ICD-10-CM | POA: Diagnosis not present

## 2016-12-20 DIAGNOSIS — H5000 Unspecified esotropia: Secondary | ICD-10-CM | POA: Diagnosis not present

## 2016-12-20 DIAGNOSIS — H21233 Degeneration of iris (pigmentary), bilateral: Secondary | ICD-10-CM | POA: Diagnosis not present

## 2016-12-25 DIAGNOSIS — E298 Other testicular dysfunction: Secondary | ICD-10-CM | POA: Diagnosis not present

## 2016-12-26 DIAGNOSIS — Z974 Presence of external hearing-aid: Secondary | ICD-10-CM | POA: Diagnosis not present

## 2016-12-26 DIAGNOSIS — Z87891 Personal history of nicotine dependence: Secondary | ICD-10-CM | POA: Diagnosis not present

## 2016-12-26 DIAGNOSIS — H903 Sensorineural hearing loss, bilateral: Secondary | ICD-10-CM | POA: Diagnosis not present

## 2016-12-26 DIAGNOSIS — H6123 Impacted cerumen, bilateral: Secondary | ICD-10-CM | POA: Diagnosis not present

## 2017-01-15 DIAGNOSIS — E298 Other testicular dysfunction: Secondary | ICD-10-CM | POA: Diagnosis not present

## 2017-02-11 DIAGNOSIS — E298 Other testicular dysfunction: Secondary | ICD-10-CM | POA: Diagnosis not present

## 2017-03-05 DIAGNOSIS — E298 Other testicular dysfunction: Secondary | ICD-10-CM | POA: Diagnosis not present

## 2017-03-05 DIAGNOSIS — E291 Testicular hypofunction: Secondary | ICD-10-CM | POA: Diagnosis not present

## 2017-03-05 DIAGNOSIS — E119 Type 2 diabetes mellitus without complications: Secondary | ICD-10-CM | POA: Diagnosis not present

## 2017-03-05 DIAGNOSIS — E038 Other specified hypothyroidism: Secondary | ICD-10-CM | POA: Diagnosis not present

## 2017-04-02 DIAGNOSIS — E298 Other testicular dysfunction: Secondary | ICD-10-CM | POA: Diagnosis not present

## 2017-04-23 DIAGNOSIS — E298 Other testicular dysfunction: Secondary | ICD-10-CM | POA: Diagnosis not present

## 2017-05-14 DIAGNOSIS — E298 Other testicular dysfunction: Secondary | ICD-10-CM | POA: Diagnosis not present

## 2017-06-04 DIAGNOSIS — E298 Other testicular dysfunction: Secondary | ICD-10-CM | POA: Diagnosis not present

## 2017-06-25 DIAGNOSIS — E291 Testicular hypofunction: Secondary | ICD-10-CM | POA: Diagnosis not present

## 2017-07-16 DIAGNOSIS — E291 Testicular hypofunction: Secondary | ICD-10-CM | POA: Diagnosis not present

## 2017-08-06 DIAGNOSIS — E291 Testicular hypofunction: Secondary | ICD-10-CM | POA: Diagnosis not present

## 2017-08-27 DIAGNOSIS — E291 Testicular hypofunction: Secondary | ICD-10-CM | POA: Diagnosis not present

## 2017-09-17 DIAGNOSIS — E291 Testicular hypofunction: Secondary | ICD-10-CM | POA: Diagnosis not present

## 2017-10-07 DIAGNOSIS — L821 Other seborrheic keratosis: Secondary | ICD-10-CM | POA: Diagnosis not present

## 2017-10-07 DIAGNOSIS — D1801 Hemangioma of skin and subcutaneous tissue: Secondary | ICD-10-CM | POA: Diagnosis not present

## 2017-10-07 DIAGNOSIS — L570 Actinic keratosis: Secondary | ICD-10-CM | POA: Diagnosis not present

## 2017-10-07 DIAGNOSIS — L812 Freckles: Secondary | ICD-10-CM | POA: Diagnosis not present

## 2017-10-08 DIAGNOSIS — E291 Testicular hypofunction: Secondary | ICD-10-CM | POA: Diagnosis not present

## 2017-10-24 DIAGNOSIS — E038 Other specified hypothyroidism: Secondary | ICD-10-CM | POA: Diagnosis not present

## 2017-10-24 DIAGNOSIS — E7849 Other hyperlipidemia: Secondary | ICD-10-CM | POA: Diagnosis not present

## 2017-10-24 DIAGNOSIS — E559 Vitamin D deficiency, unspecified: Secondary | ICD-10-CM | POA: Diagnosis not present

## 2017-10-24 DIAGNOSIS — E119 Type 2 diabetes mellitus without complications: Secondary | ICD-10-CM | POA: Diagnosis not present

## 2017-10-24 DIAGNOSIS — Z125 Encounter for screening for malignant neoplasm of prostate: Secondary | ICD-10-CM | POA: Diagnosis not present

## 2017-10-24 DIAGNOSIS — R82998 Other abnormal findings in urine: Secondary | ICD-10-CM | POA: Diagnosis not present

## 2017-10-29 DIAGNOSIS — E291 Testicular hypofunction: Secondary | ICD-10-CM | POA: Diagnosis not present

## 2017-10-31 DIAGNOSIS — Z1389 Encounter for screening for other disorder: Secondary | ICD-10-CM | POA: Diagnosis not present

## 2017-10-31 DIAGNOSIS — D126 Benign neoplasm of colon, unspecified: Secondary | ICD-10-CM | POA: Diagnosis not present

## 2017-10-31 DIAGNOSIS — Z0000 Encounter for general adult medical examination without abnormal findings: Secondary | ICD-10-CM | POA: Diagnosis not present

## 2017-10-31 DIAGNOSIS — E1169 Type 2 diabetes mellitus with other specified complication: Secondary | ICD-10-CM | POA: Diagnosis not present

## 2017-10-31 DIAGNOSIS — E559 Vitamin D deficiency, unspecified: Secondary | ICD-10-CM | POA: Diagnosis not present

## 2017-10-31 DIAGNOSIS — I10 Essential (primary) hypertension: Secondary | ICD-10-CM | POA: Diagnosis not present

## 2017-10-31 DIAGNOSIS — R0781 Pleurodynia: Secondary | ICD-10-CM | POA: Diagnosis not present

## 2017-10-31 DIAGNOSIS — R232 Flushing: Secondary | ICD-10-CM | POA: Diagnosis not present

## 2017-10-31 DIAGNOSIS — G4733 Obstructive sleep apnea (adult) (pediatric): Secondary | ICD-10-CM | POA: Diagnosis not present

## 2017-10-31 DIAGNOSIS — M810 Age-related osteoporosis without current pathological fracture: Secondary | ICD-10-CM | POA: Diagnosis not present

## 2017-10-31 DIAGNOSIS — E291 Testicular hypofunction: Secondary | ICD-10-CM | POA: Diagnosis not present

## 2017-10-31 DIAGNOSIS — Z6826 Body mass index (BMI) 26.0-26.9, adult: Secondary | ICD-10-CM | POA: Diagnosis not present

## 2017-11-19 DIAGNOSIS — E291 Testicular hypofunction: Secondary | ICD-10-CM | POA: Diagnosis not present

## 2017-12-10 DIAGNOSIS — E291 Testicular hypofunction: Secondary | ICD-10-CM | POA: Diagnosis not present

## 2017-12-29 ENCOUNTER — Encounter (INDEPENDENT_AMBULATORY_CARE_PROVIDER_SITE_OTHER): Payer: Self-pay

## 2017-12-29 ENCOUNTER — Ambulatory Visit (INDEPENDENT_AMBULATORY_CARE_PROVIDER_SITE_OTHER): Payer: Medicare Other | Admitting: Gastroenterology

## 2017-12-29 ENCOUNTER — Encounter: Payer: Self-pay | Admitting: Gastroenterology

## 2017-12-29 VITALS — BP 146/88 | HR 76 | Ht 70.0 in | Wt 186.5 lb

## 2017-12-29 DIAGNOSIS — H21233 Degeneration of iris (pigmentary), bilateral: Secondary | ICD-10-CM | POA: Diagnosis not present

## 2017-12-29 DIAGNOSIS — K219 Gastro-esophageal reflux disease without esophagitis: Secondary | ICD-10-CM

## 2017-12-29 DIAGNOSIS — Z86010 Personal history of colonic polyps: Secondary | ICD-10-CM | POA: Diagnosis not present

## 2017-12-29 DIAGNOSIS — H5213 Myopia, bilateral: Secondary | ICD-10-CM | POA: Diagnosis not present

## 2017-12-29 DIAGNOSIS — H40013 Open angle with borderline findings, low risk, bilateral: Secondary | ICD-10-CM | POA: Diagnosis not present

## 2017-12-29 DIAGNOSIS — R1319 Other dysphagia: Secondary | ICD-10-CM

## 2017-12-29 DIAGNOSIS — R1310 Dysphagia, unspecified: Secondary | ICD-10-CM | POA: Diagnosis not present

## 2017-12-29 DIAGNOSIS — H2513 Age-related nuclear cataract, bilateral: Secondary | ICD-10-CM | POA: Diagnosis not present

## 2017-12-29 NOTE — Patient Instructions (Addendum)
You have been scheduled for an endoscopy. Please follow written instructions given to you at your visit today. If you use inhalers (even only as needed), please bring them with you on the day of your procedure. Your physician has requested that you go to www.startemmi.com and enter the access code given to you at your visit today. This web site gives a general overview about your procedure. However, you should still follow specific instructions given to you by our office regarding your preparation for the procedure.  Patient advised to avoid spicy, acidic, citrus, chocolate, mints, fruit and fruit juices.  Limit the intake of caffeine, alcohol and Soda.  Don't exercise too soon after eating.  Don't lie down within 3-4 hours of eating.  Elevate the head of your bed.   Normal BMI (Body Mass Index- based on height and weight) is between 23 and 30. Your BMI today is Body mass index is 26.76 kg/m. Marland Kitchen Please consider follow up  regarding your BMI with your Primary Care Provider.  Thank you for choosing me and [facility].  Pricilla Riffle. Dagoberto Ligas., MD., [facility] Regal

## 2017-12-29 NOTE — Progress Notes (Signed)
. ..    History of Present Illness: This is a 77 year old male referred by Douglas Infante, MD for the evaluation of GERD and dysphagia.  He has a long history of GERD and was maintained on Nexium for many years.  He stopped Nexium after he was diagnosed with osteoporosis.  Since that time he has been taking Pepcid AC as needed.  He relates 5 specific episodes over the past 10 years of shortness of breath and choking that he feels were related to acid reflux.  3 episodes occurred while sleeping and 2 episodes of occurred in the evening while standing after the evening meal.  He has noted intermittent solid food dysphagia for the past several months.  Symptoms occur about once per month.  He typically notes dysphagia when eating a hamburger.  He has occasional heartburn and a lump in his throat sensation.  He was placed on pantoprazole by Dr. Joylene Draft and all symptoms have resolved.  Denies weight loss, abdominal pain, constipation, diarrhea, change in stool caliber, melena, hematochezia, nausea, vomiting, chest pain.    No Known Allergies Outpatient Medications Prior to Visit  Medication Sig Dispense Refill  . Calcium Carbonate-Vitamin D (CALCIUM-VITAMIN D) 600-125 MG-UNIT TABS Take by mouth daily.    . cetirizine (ZYRTEC) 5 MG tablet Take 10 mg by mouth daily.     . cholecalciferol (VITAMIN D) 1000 UNITS tablet Take 1,000 Units by mouth 2 (two) times daily.    . fish oil-omega-3 fatty acids 1000 MG capsule Take 1 g by mouth 4 (four) times daily.     Marland Kitchen levothyroxine (SYNTHROID, LEVOTHROID) 112 MCG tablet Take 112 mcg by mouth daily.    . metroNIDAZOLE (METROGEL) 0.75 % gel Apply 1 application topically 2 (two) times daily.    . Multiple Vitamin (MULTIVITAMIN PO) Take by mouth daily.      Marland Kitchen OVER THE COUNTER MEDICATION Magnesium 500mg  po daily    . pantoprazole (PROTONIX) 40 MG tablet Take 40 mg by mouth daily.    . simvastatin (ZOCOR) 40 MG tablet Take 40 mg by mouth every evening.    Marland Kitchen telmisartan  (MICARDIS) 80 MG tablet Take 80 mg by mouth daily.    Marland Kitchen testosterone cypionate (DEPOTESTOTERONE CYPIONATE) 100 MG/ML injection Inject into the muscle every 21 ( twenty-one) days. For IM use only    . alendronate (FOSAMAX) 70 MG tablet Take 70 mg by mouth every 7 (seven) days. Take with a full glass of water on an empty stomach. Take on Sundays    . PRESCRIPTION MEDICATION Testosterone injections     No facility-administered medications prior to visit.    Past Medical History:  Diagnosis Date  . Allergy    seasonal  . Anemia   . Bilateral inguinal hernia   . Chronic kidney disease    h/o- renal calculi- passed spont. , 10 episodes over the course of 20 yrs.   Marland Kitchen GERD (gastroesophageal reflux disease)   . Hyperlipidemia   . Hypertension   . Hypothyroidism   . Osteoporosis   . Sleep apnea    CPAP q night - study done 8-10 yrs. ago   . Testosterone deficiency    injections q 3 week- July 8- last injection   . Thyroid disease    Past Surgical History:  Procedure Laterality Date  . ANAL FISSURE REPAIR    . CHOLECYSTECTOMY    . COLONOSCOPY    . FIBULA FRACTURE SURGERY     fibula and tibia fracture   .  FRACTURE SURGERY     L leg -plate & screws - ORIF- fibula  . HEMORRHOID SURGERY    . HERNIA REPAIR  04/19/11   bilateral  . LAPAROSCOPIC INGUINAL HERNIA REPAIR  04/19/11   bilateral  . LAPAROSCOPY  10/11/2011   Procedure: LAPAROSCOPY DIAGNOSTIC;  Surgeon: Adin Hector, MD;  Location: [facility];  Service: General;  Laterality: N/A;  UMBILICAL EXPLORATION  . MASS EXCISION  10/11/2011   Procedure: EXCISION MASS;  Surgeon: Adin Hector, MD;  Location: [facility];  Service: General;  Laterality: N/A;  EXCISION OF UMBILICAL MASS  . POLYPECTOMY     Social History   Socioeconomic History  . Marital status: Married    Spouse name: Not on file  . Number of children: Not on file  . Years of education: Not on file  . Highest education level: Not on file  Occupational History  . Not on file    Social Needs  . Financial resource strain: Not on file  . Food insecurity:    Worry: Not on file    Inability: Not on file  . Transportation needs:    Medical: Not on file    Non-medical: Not on file  Tobacco Use  . Smoking status: Former Smoker    Last attempt to quit: 06/05/1998    Years since quitting: 19.5  . Smokeless tobacco: Never Used  Substance and Sexual Activity  . Alcohol use: No  . Drug use: No  . Sexual activity: Not on file  Lifestyle  . Physical activity:    Days per week: Not on file    Minutes per session: Not on file  . Stress: Not on file  Relationships  . Social connections:    Talks on phone: Not on file    Gets together: Not on file    Attends religious service: Not on file    Active member of club or organization: Not on file    Attends meetings of clubs or organizations: Not on file    Relationship status: Not on file  Other Topics Concern  . Not on file  Social History Narrative  . Not on file   Family History  Problem Relation Age of Onset  . Stroke Mother   . Stroke Father   . Colon cancer Neg Hx   . Rectal cancer Neg Hx   . Stomach cancer Neg Hx   . Esophageal cancer Neg Hx   . Colon polyps Neg Hx        Review of Systems: Pertinent positive and negative review of systems were noted in the above HPI section. All other review of systems were otherwise negative.    Physical Exam: General: Well developed, well nourished, no acute distress Head: Normocephalic and atraumatic Eyes:  sclerae anicteric, EOMI Ears: Normal auditory acuity Mouth: No deformity or lesions Neck: Supple, no masses or thyromegaly Lungs: Clear throughout to auscultation Heart: Regular rate and rhythm; no murmurs, rubs or bruits Abdomen: Soft, non tender and non distended. No masses, hepatosplenomegaly or hernias noted. Normal Bowel sounds Rectal: Not done Musculoskeletal: Symmetrical with no gross deformities  Skin: No lesions on visible  extremities Pulses:  Normal pulses noted Extremities: No clubbing, cyanosis, edema or deformities noted Neurological: Alert oriented x 4, grossly nonfocal Cervical Nodes:  No significant cervical adenopathy Inguinal Nodes: No significant inguinal adenopathy Psychological:  Alert and cooperative. Normal mood and affect   Assessment and Recommendations:  1. GERD, dysphagia.  EGD in 2012 showed a  4 cm hiatal hernia, gastritis and duodenitis.  Continue pantoprazole 40 mg daily and follow standard antireflux measures.  Rule out esophagitis, stricture, Barrett's.  Schedule EGD. The risks (including bleeding, perforation, infection, missed lesions, medication reactions and possible hospitalization or surgery if complications occur), benefits, and alternatives to endoscopy with possible biopsy and possible dilation were discussed with the patient and they consent to proceed.   2.  Personal history of adenomatous colon polyps.  A 5-year interval surveillance colonoscopy is due in June 2021.   cc: Douglas Infante, MD [address] [place], [place] [address]

## 2017-12-30 ENCOUNTER — Encounter: Payer: Self-pay | Admitting: Gastroenterology

## 2017-12-30 ENCOUNTER — Ambulatory Visit (AMBULATORY_SURGERY_CENTER): Payer: Medicare Other | Admitting: Gastroenterology

## 2017-12-30 VITALS — BP 111/64 | HR 52 | Temp 97.1°F | Resp 15 | Ht 70.0 in | Wt 186.0 lb

## 2017-12-30 DIAGNOSIS — K219 Gastro-esophageal reflux disease without esophagitis: Secondary | ICD-10-CM

## 2017-12-30 DIAGNOSIS — I10 Essential (primary) hypertension: Secondary | ICD-10-CM | POA: Diagnosis not present

## 2017-12-30 DIAGNOSIS — G4733 Obstructive sleep apnea (adult) (pediatric): Secondary | ICD-10-CM | POA: Diagnosis not present

## 2017-12-30 DIAGNOSIS — R1310 Dysphagia, unspecified: Secondary | ICD-10-CM

## 2017-12-30 MED ORDER — SODIUM CHLORIDE 0.9 % IV SOLN: 500.0000 mL | Freq: Once | INTRAVENOUS | Status: DC

## 2017-12-30 NOTE — Patient Instructions (Signed)
YOU HAD AN ENDOSCOPIC PROCEDURE TODAY AT [facility]:   Refer to the procedure report that was given to you for any specific questions about what was found during the examination.  If the procedure report does not answer your questions, please call your gastroenterologist to clarify.  If you requested that your care partner not be given the details of your procedure findings, then the procedure report has been included in a sealed envelope for you to review at your convenience later.  YOU SHOULD EXPECT: Some feelings of bloating in the abdomen. Passage of more gas than usual.  Walking can help get rid of the air that was put into your GI tract during the procedure and reduce the bloating. If you had a lower endoscopy (such as a colonoscopy or flexible sigmoidoscopy) you may notice spotting of blood in your stool or on the toilet paper. If you underwent a bowel prep for your procedure, you may not have a normal bowel movement for a few days.  Please Note:  You might notice some irritation and congestion in your nose or some drainage.  This is from the oxygen used during your procedure.  There is no need for concern and it should clear up in a day or so.  SYMPTOMS TO REPORT IMMEDIATELY:    Following upper endoscopy (EGD)  Vomiting of blood or coffee ground material  New chest pain or pain under the shoulder blades  Painful or persistently difficult swallowing  New shortness of breath  Fever of 100F or higher  Black, tarry-looking stools  See handouts given to you on Hiatal hernia and Stricture.  For urgent or emergent issues, a gastroenterologist can be reached at any hour by calling [phone].   DIET: Please see post dilation diet. Clear liquids for an hour until 5 pm, then soft diet for the rest of the day. Tomorrow you may proceed to your regular diet.  Drink plenty of fluids but you should avoid alcoholic beverages for 24 hours.  ACTIVITY:  You should plan to take it  easy for the rest of today and you should NOT DRIVE or use heavy machinery until tomorrow (because of the sedation medicines used during the test).    FOLLOW UP: Our staff will call the number listed on your records the next business day following your procedure to check on you and address any questions or concerns that you may have regarding the information given to you following your procedure. If we do not reach you, we will leave a message.  However, if you are feeling well and you are not experiencing any problems, there is no need to return our call.  We will assume that you have returned to your regular daily activities without incident.  If any biopsies were taken you will be contacted by phone or by letter within the next 1-3 weeks.  Please call us at [phone] if you have not heard about the biopsies in 3 weeks.    SIGNATURES/CONFIDENTIALITY: You and/or your care partner have signed paperwork which will be entered into your electronic medical record.  These signatures attest to the fact that that the information above on your After Visit Summary has been reviewed and is understood.  Full responsibility of the confidentiality of this discharge information lies with you and/or your care-partner.  Thank you for letting us take care of your healthcare needs today.

## 2017-12-30 NOTE — Op Note (Signed)
[facility] Patient Name: Douglas [name] Procedure Date: 12/30/2017 2:47 PM MRN: [id number] Endoscopist: Ladene Artist , MD Age: 77 Referring MD:  Date of Birth: 09/28/1940 Gender: Male Account #: [id number] Procedure:                Upper GI endoscopy Indications:              Dysphagia, Gastroesophageal reflux disease Medicines:                Monitored Anesthesia Care Procedure:                Pre-Anesthesia Assessment:                           - Prior to the procedure, a History and Physical                            was performed, and patient medications and                            allergies were reviewed. The patient's tolerance of                            previous anesthesia was also reviewed. The risks                            and benefits of the procedure and the sedation                            options and risks were discussed with the patient.                            All questions were answered, and informed consent                            was obtained. Prior Anticoagulants: The patient has                            taken no previous anticoagulant or antiplatelet                            agents. ASA Grade Assessment: II - A patient with                            mild systemic disease. After reviewing the risks                            and benefits, the patient was deemed in                            satisfactory condition to undergo the procedure.                           After obtaining informed consent, the endoscope was  passed under direct vision. Throughout the                            procedure, the patient's blood pressure, pulse, and                            oxygen saturations were monitored continuously. The                            Endoscope was introduced through the mouth, and                            advanced to the second part of duodenum. The upper                            GI endoscopy  was accomplished without difficulty.                            The patient tolerated the procedure well. Scope In: Scope Out: Findings:                 One benign-appearing, intrinsic mild stenosis was                            found at the gastroesophageal junction. This                            stenosis measured 1.4 cm (inner diameter). The                            stenosis was traversed. A guidewire was placed and                            the scope was withdrawn. Dilations were performed                            with Savary dilators with mild resistance at 15 mm,                            16 mm and 17 mm.                           The exam of the esophagus was otherwise normal.                           A medium-sized hiatal hernia was present.                           The exam of the stomach was otherwise normal.                           The duodenal bulb and second portion of the  duodenum were normal. Complications:            No immediate complications. Estimated Blood Loss:     Estimated blood loss: none. Impression:               - Benign-appearing esophageal stenosis. Dilated.                           - Medium-sized hiatal hernia.                           - Normal duodenal bulb and second portion of the                            duodenum.                           - No specimens collected. Recommendation:           - Patient has a contact number available for                            emergencies. The signs and symptoms of potential                            delayed complications were discussed with the                            patient. Return to normal activities tomorrow.                            Written discharge instructions were provided to the                            patient.                           - Clear liquid diet for 2 hours, then advance as                            tolerated to soft diet today.                            - Resume prior diet tomorrow.                           - Continue present medications including                            pantoprazole 40 mg po qam.                           - Antireflux measures long term. Ladene Artist, MD 12/30/2017 3:00:59 PM This report has been signed electronically.

## 2017-12-30 NOTE — Progress Notes (Signed)
Pt's states no medical or surgical changes since previsit or office visit. 

## 2017-12-30 NOTE — Progress Notes (Signed)
Called to room to assist during endoscopic procedure.  Patient ID and intended procedure confirmed with present staff. Received instructions for my participation in the procedure from the performing physician.  

## 2017-12-31 ENCOUNTER — Telehealth: Payer: Self-pay

## 2017-12-31 DIAGNOSIS — E291 Testicular hypofunction: Secondary | ICD-10-CM | POA: Diagnosis not present

## 2017-12-31 NOTE — Telephone Encounter (Signed)
  Follow up Call-  Call back number 12/30/2017  Post procedure Call Back phone  # [phone]  Permission to leave phone message Yes  Some recent data might be hidden     Patient questions:  Do you have a fever, pain , or abdominal swelling? No. Pain Score  0 *  Have you tolerated food without any problems? Yes.    Have you been able to return to your normal activities? Yes.    Do you have any questions about your discharge instructions: Diet   No. Medications  No. Follow up visit  No.  Do you have questions or concerns about your Care? No.  Actions: * If pain score is 4 or above: No action needed, pain <4.

## 2018-01-18 DIAGNOSIS — Z23 Encounter for immunization: Secondary | ICD-10-CM | POA: Diagnosis not present

## 2018-01-21 DIAGNOSIS — E291 Testicular hypofunction: Secondary | ICD-10-CM | POA: Diagnosis not present

## 2018-02-11 DIAGNOSIS — E291 Testicular hypofunction: Secondary | ICD-10-CM | POA: Diagnosis not present

## 2018-03-04 DIAGNOSIS — E291 Testicular hypofunction: Secondary | ICD-10-CM | POA: Diagnosis not present

## 2018-03-26 DIAGNOSIS — E291 Testicular hypofunction: Secondary | ICD-10-CM | POA: Diagnosis not present

## 2018-04-15 DIAGNOSIS — E291 Testicular hypofunction: Secondary | ICD-10-CM | POA: Diagnosis not present

## 2018-05-06 DIAGNOSIS — E291 Testicular hypofunction: Secondary | ICD-10-CM | POA: Diagnosis not present

## 2018-05-27 DIAGNOSIS — E291 Testicular hypofunction: Secondary | ICD-10-CM | POA: Diagnosis not present

## 2018-06-17 DIAGNOSIS — E291 Testicular hypofunction: Secondary | ICD-10-CM | POA: Diagnosis not present

## 2018-07-08 DIAGNOSIS — E291 Testicular hypofunction: Secondary | ICD-10-CM | POA: Diagnosis not present

## 2018-07-13 DIAGNOSIS — R1032 Left lower quadrant pain: Secondary | ICD-10-CM | POA: Diagnosis not present

## 2018-07-13 DIAGNOSIS — I129 Hypertensive chronic kidney disease with stage 1 through stage 4 chronic kidney disease, or unspecified chronic kidney disease: Secondary | ICD-10-CM | POA: Diagnosis not present

## 2018-07-13 DIAGNOSIS — N183 Chronic kidney disease, stage 3 (moderate): Secondary | ICD-10-CM | POA: Diagnosis not present

## 2018-07-29 DIAGNOSIS — E291 Testicular hypofunction: Secondary | ICD-10-CM | POA: Diagnosis not present

## 2018-08-19 DIAGNOSIS — E291 Testicular hypofunction: Secondary | ICD-10-CM | POA: Diagnosis not present

## 2018-09-09 DIAGNOSIS — E291 Testicular hypofunction: Secondary | ICD-10-CM | POA: Diagnosis not present

## 2018-09-30 DIAGNOSIS — E291 Testicular hypofunction: Secondary | ICD-10-CM | POA: Diagnosis not present

## 2018-10-13 DIAGNOSIS — L309 Dermatitis, unspecified: Secondary | ICD-10-CM | POA: Diagnosis not present

## 2018-10-13 DIAGNOSIS — L821 Other seborrheic keratosis: Secondary | ICD-10-CM | POA: Diagnosis not present

## 2018-10-13 DIAGNOSIS — L812 Freckles: Secondary | ICD-10-CM | POA: Diagnosis not present

## 2018-10-13 DIAGNOSIS — D1801 Hemangioma of skin and subcutaneous tissue: Secondary | ICD-10-CM | POA: Diagnosis not present

## 2018-10-13 DIAGNOSIS — D225 Melanocytic nevi of trunk: Secondary | ICD-10-CM | POA: Diagnosis not present

## 2018-10-21 DIAGNOSIS — E291 Testicular hypofunction: Secondary | ICD-10-CM | POA: Diagnosis not present

## 2018-11-11 DIAGNOSIS — E291 Testicular hypofunction: Secondary | ICD-10-CM | POA: Diagnosis not present

## 2018-11-11 DIAGNOSIS — Z23 Encounter for immunization: Secondary | ICD-10-CM | POA: Diagnosis not present

## 2018-11-26 DIAGNOSIS — E7849 Other hyperlipidemia: Secondary | ICD-10-CM | POA: Diagnosis not present

## 2018-11-26 DIAGNOSIS — Z125 Encounter for screening for malignant neoplasm of prostate: Secondary | ICD-10-CM | POA: Diagnosis not present

## 2018-11-26 DIAGNOSIS — E291 Testicular hypofunction: Secondary | ICD-10-CM | POA: Diagnosis not present

## 2018-11-26 DIAGNOSIS — E1169 Type 2 diabetes mellitus with other specified complication: Secondary | ICD-10-CM | POA: Diagnosis not present

## 2018-11-26 DIAGNOSIS — M810 Age-related osteoporosis without current pathological fracture: Secondary | ICD-10-CM | POA: Diagnosis not present

## 2018-11-26 DIAGNOSIS — E038 Other specified hypothyroidism: Secondary | ICD-10-CM | POA: Diagnosis not present

## 2018-12-02 DIAGNOSIS — E291 Testicular hypofunction: Secondary | ICD-10-CM | POA: Diagnosis not present

## 2018-12-02 DIAGNOSIS — R82998 Other abnormal findings in urine: Secondary | ICD-10-CM | POA: Diagnosis not present

## 2018-12-04 DIAGNOSIS — E291 Testicular hypofunction: Secondary | ICD-10-CM | POA: Diagnosis not present

## 2018-12-04 DIAGNOSIS — N183 Chronic kidney disease, stage 3 (moderate): Secondary | ICD-10-CM | POA: Diagnosis not present

## 2018-12-04 DIAGNOSIS — M810 Age-related osteoporosis without current pathological fracture: Secondary | ICD-10-CM | POA: Diagnosis not present

## 2018-12-04 DIAGNOSIS — K219 Gastro-esophageal reflux disease without esophagitis: Secondary | ICD-10-CM | POA: Diagnosis not present

## 2018-12-04 DIAGNOSIS — Z1339 Encounter for screening examination for other mental health and behavioral disorders: Secondary | ICD-10-CM | POA: Diagnosis not present

## 2018-12-04 DIAGNOSIS — Z0000 Encounter for general adult medical examination without abnormal findings: Secondary | ICD-10-CM | POA: Diagnosis not present

## 2018-12-04 DIAGNOSIS — I129 Hypertensive chronic kidney disease with stage 1 through stage 4 chronic kidney disease, or unspecified chronic kidney disease: Secondary | ICD-10-CM | POA: Diagnosis not present

## 2018-12-04 DIAGNOSIS — Z1331 Encounter for screening for depression: Secondary | ICD-10-CM | POA: Diagnosis not present

## 2018-12-04 DIAGNOSIS — D126 Benign neoplasm of colon, unspecified: Secondary | ICD-10-CM | POA: Diagnosis not present

## 2018-12-04 DIAGNOSIS — E1169 Type 2 diabetes mellitus with other specified complication: Secondary | ICD-10-CM | POA: Diagnosis not present

## 2018-12-04 DIAGNOSIS — E785 Hyperlipidemia, unspecified: Secondary | ICD-10-CM | POA: Diagnosis not present

## 2018-12-04 DIAGNOSIS — E039 Hypothyroidism, unspecified: Secondary | ICD-10-CM | POA: Diagnosis not present

## 2018-12-04 DIAGNOSIS — G4733 Obstructive sleep apnea (adult) (pediatric): Secondary | ICD-10-CM | POA: Diagnosis not present

## 2018-12-04 DIAGNOSIS — D649 Anemia, unspecified: Secondary | ICD-10-CM | POA: Diagnosis not present

## 2018-12-11 ENCOUNTER — Other Ambulatory Visit (HOSPITAL_COMMUNITY): Payer: Self-pay

## 2018-12-14 ENCOUNTER — Other Ambulatory Visit: Payer: Self-pay

## 2018-12-14 ENCOUNTER — Ambulatory Visit (HOSPITAL_COMMUNITY)
Admission: RE | Admit: 2018-12-14 | Discharge: 2018-12-14 | Disposition: A | Discharge status: Home / Self Care | Payer: Medicare Other | Source: Ambulatory Visit | Attending: Internal Medicine | Admitting: Internal Medicine

## 2018-12-14 DIAGNOSIS — M810 Age-related osteoporosis without current pathological fracture: Secondary | ICD-10-CM | POA: Diagnosis not present

## 2018-12-14 MED ORDER — DENOSUMAB 60 MG/ML ~~LOC~~ SOSY: 60.0000 mg | PREFILLED_SYRINGE | Freq: Once | SUBCUTANEOUS | Status: DC

## 2018-12-14 MED ORDER — DENOSUMAB 60 MG/ML ~~LOC~~ SOSY
PREFILLED_SYRINGE | SUBCUTANEOUS | Status: AC
  Filled 2018-12-14: qty 1

## 2018-12-14 MED ORDER — DENOSUMAB 60 MG/ML ~~LOC~~ SOSY
PREFILLED_SYRINGE | SUBCUTANEOUS | Status: AC
  Administered 2018-12-14: 60 mg via SUBCUTANEOUS
  Filled 2018-12-14: qty 1

## 2018-12-14 NOTE — Discharge Instructions (Signed)
Denosumab injection °What is this medicine? °DENOSUMAB (den oh sue mab) slows bone breakdown. Prolia is used to treat osteoporosis in women after menopause and in men, and in people who are taking corticosteroids for 6 months or more. Xgeva is used to treat a high calcium level due to cancer and to prevent bone fractures and other bone problems caused by multiple myeloma or cancer bone metastases. Xgeva is also used to treat giant cell tumor of the bone. °This medicine may be used for other purposes; ask your health care provider or pharmacist if you have questions. °COMMON BRAND NAME(S): Prolia, XGEVA °What should I tell my health care provider before I take this medicine? °They need to know if you have any of these conditions: °· dental disease °· having surgery or tooth extraction °· infection °· kidney disease °· low levels of calcium or Vitamin D in the blood °· malnutrition °· on hemodialysis °· skin conditions or sensitivity °· thyroid or parathyroid disease °· an unusual reaction to denosumab, other medicines, foods, dyes, or preservatives °· pregnant or trying to get pregnant °· breast-feeding °How should I use this medicine? °This medicine is for injection under the skin. It is given by a health care professional in a hospital or clinic setting. °A special MedGuide will be given to you before each treatment. Be sure to read this information carefully each time. °For Prolia, talk to your pediatrician regarding the use of this medicine in children. Special care may be needed. For Xgeva, talk to your pediatrician regarding the use of this medicine in children. While this drug may be prescribed for children as young as 13 years for selected conditions, precautions do apply. °Overdosage: If you think you have taken too much of this medicine contact a poison control center or emergency room at once. °NOTE: This medicine is only for you. Do not share this medicine with others. °What if I miss a dose? °It is  important not to miss your dose. Call your doctor or health care professional if you are unable to keep an appointment. °What may interact with this medicine? °Do not take this medicine with any of the following medications: °· other medicines containing denosumab °This medicine may also interact with the following medications: °· medicines that lower your chance of fighting infection °· steroid medicines like prednisone or cortisone °This list may not describe all possible interactions. Give your health care provider a list of all the medicines, herbs, non-prescription drugs, or dietary supplements you use. Also tell them if you smoke, drink alcohol, or use illegal drugs. Some items may interact with your medicine. °What should I watch for while using this medicine? °Visit your doctor or health care professional for regular checks on your progress. Your doctor or health care professional may order blood tests and other tests to see how you are doing. °Call your doctor or health care professional for advice if you get a fever, chills or sore throat, or other symptoms of a cold or flu. Do not treat yourself. This drug may decrease your body's ability to fight infection. Try to avoid being around people who are sick. °You should make sure you get enough calcium and vitamin D while you are taking this medicine, unless your doctor tells you not to. Discuss the foods you eat and the vitamins you take with your health care professional. °See your dentist regularly. Brush and floss your teeth as directed. Before you have any dental work done, tell your dentist you are   receiving this medicine. Do not become pregnant while taking this medicine or for 5 months after stopping it. Talk with your doctor or health care professional about your birth control options while taking this medicine. Women should inform their doctor if they wish to become pregnant or think they might be pregnant. There is a potential for serious side  effects to an unborn child. Talk to your health care professional or pharmacist for more information. What side effects may I notice from receiving this medicine? Side effects that you should report to your doctor or health care professional as soon as possible:  allergic reactions like skin rash, itching or hives, swelling of the face, lips, or tongue  bone pain  breathing problems  dizziness  jaw pain, especially after dental work  redness, blistering, peeling of the skin  signs and symptoms of infection like fever or chills; cough; sore throat; pain or trouble passing urine  signs of low calcium like fast heartbeat, muscle cramps or muscle pain; pain, tingling, numbness in the hands or feet; seizures  unusual bleeding or bruising  unusually weak or tired Side effects that usually do not require medical attention (report to your doctor or health care professional if they continue or are bothersome):  constipation  diarrhea  headache  joint pain  loss of appetite  muscle pain  runny nose  tiredness  upset stomach This list may not describe all possible side effects. Call your doctor for medical advice about side effects. You may report side effects to FDA at 1-800-FDA-1088. Where should I keep my medicine? This medicine is only given in a clinic, doctor's office, or other health care setting and will not be stored at home. NOTE: This sheet is a summary. It may not cover all possible information. If you have questions about this medicine, talk to your doctor, pharmacist, or health care provider.  2020 Elsevier/Gold Standard (2017-07-18 16:10:44)

## 2018-12-23 DIAGNOSIS — E291 Testicular hypofunction: Secondary | ICD-10-CM | POA: Diagnosis not present

## 2019-01-04 DIAGNOSIS — H2513 Age-related nuclear cataract, bilateral: Secondary | ICD-10-CM | POA: Diagnosis not present

## 2019-01-04 DIAGNOSIS — H21233 Degeneration of iris (pigmentary), bilateral: Secondary | ICD-10-CM | POA: Diagnosis not present

## 2019-01-04 DIAGNOSIS — H5213 Myopia, bilateral: Secondary | ICD-10-CM | POA: Diagnosis not present

## 2019-01-04 DIAGNOSIS — H40023 Open angle with borderline findings, high risk, bilateral: Secondary | ICD-10-CM | POA: Diagnosis not present

## 2019-01-13 DIAGNOSIS — E291 Testicular hypofunction: Secondary | ICD-10-CM | POA: Diagnosis not present

## 2019-02-03 DIAGNOSIS — E291 Testicular hypofunction: Secondary | ICD-10-CM | POA: Diagnosis not present

## 2019-02-12 DIAGNOSIS — H6123 Impacted cerumen, bilateral: Secondary | ICD-10-CM | POA: Diagnosis not present

## 2019-02-24 DIAGNOSIS — E291 Testicular hypofunction: Secondary | ICD-10-CM | POA: Diagnosis not present

## 2019-04-05 ENCOUNTER — Ambulatory Visit: Payer: Medicare Other | Attending: Internal Medicine

## 2019-04-05 DIAGNOSIS — Z23 Encounter for immunization: Secondary | ICD-10-CM | POA: Insufficient documentation

## 2019-04-05 NOTE — Progress Notes (Signed)
   Covid-19 Vaccination Clinic  Name:  Douglas [name]    MRN: [id number] DOB: [birth year]  04/05/2019  Douglas [name] was observed post Covid-19 immunization for 15 minutes without incidence. He was provided with Vaccine Information Sheet and instruction to access the V-Safe system.   Douglas [name] was instructed to call 911 with any severe reactions post vaccine: Marland Kitchen Difficulty breathing  . Swelling of your face and throat  . A fast heartbeat  . A bad rash all over your body  . Dizziness and weakness    Immunizations Administered    Name Date Dose VIS Date Route   Pfizer COVID-19 Vaccine 04/05/2019  9:54 AM 0.3 mL 03/05/2019 Intramuscular   Manufacturer: [place]   Lot: [id number]   [place]: [id number]

## 2019-04-07 DIAGNOSIS — E291 Testicular hypofunction: Secondary | ICD-10-CM | POA: Diagnosis not present

## 2019-04-25 ENCOUNTER — Ambulatory Visit: Payer: Medicare Other | Attending: Internal Medicine

## 2019-04-25 DIAGNOSIS — Z23 Encounter for immunization: Secondary | ICD-10-CM | POA: Insufficient documentation

## 2019-04-25 NOTE — Progress Notes (Signed)
   Covid-19 Vaccination Clinic  Name:  Douglas [name]    MRN: [id number] DOB: October 31, 1940  04/25/2019  Mr. Douglas [name] was observed post Covid-19 immunization for 15 minutes without incidence. He was provided with Vaccine Information Sheet and instruction to access the V-Safe system.   Mr. Douglas [name] was instructed to call 911 with any severe reactions post vaccine: Marland Kitchen Difficulty breathing  . Swelling of your face and throat  . A fast heartbeat  . A bad rash all over your body  . Dizziness and weakness    Immunizations Administered    Name Date Dose VIS Date Route   Pfizer COVID-19 Vaccine 04/25/2019 11:01 AM 0.3 mL 03/05/2019 Intramuscular   Manufacturer: [place]   Lot: [id number]   [place]: [id number]

## 2019-04-29 DIAGNOSIS — E291 Testicular hypofunction: Secondary | ICD-10-CM | POA: Diagnosis not present

## 2019-05-10 ENCOUNTER — Ambulatory Visit (INDEPENDENT_AMBULATORY_CARE_PROVIDER_SITE_OTHER): Payer: Medicare Other | Admitting: Allergy and Immunology

## 2019-05-10 ENCOUNTER — Ambulatory Visit: Payer: Self-pay

## 2019-05-10 ENCOUNTER — Encounter: Payer: Self-pay | Admitting: Allergy and Immunology

## 2019-05-10 ENCOUNTER — Other Ambulatory Visit: Payer: Self-pay

## 2019-05-10 VITALS — BP 164/80 | HR 72 | Temp 97.4°F | Resp 18 | Ht 68.6 in | Wt 194.6 lb

## 2019-05-10 DIAGNOSIS — T7840XD Allergy, unspecified, subsequent encounter: Secondary | ICD-10-CM

## 2019-05-10 DIAGNOSIS — R1314 Dysphagia, pharyngoesophageal phase: Secondary | ICD-10-CM | POA: Diagnosis not present

## 2019-05-10 DIAGNOSIS — K219 Gastro-esophageal reflux disease without esophagitis: Secondary | ICD-10-CM | POA: Diagnosis not present

## 2019-05-10 DIAGNOSIS — K9049 Malabsorption due to intolerance, not elsewhere classified: Secondary | ICD-10-CM | POA: Diagnosis not present

## 2019-05-10 DIAGNOSIS — R1310 Dysphagia, unspecified: Secondary | ICD-10-CM | POA: Insufficient documentation

## 2019-05-10 DIAGNOSIS — T7840XA Allergy, unspecified, initial encounter: Secondary | ICD-10-CM | POA: Insufficient documentation

## 2019-05-10 MED ORDER — FAMOTIDINE 20 MG PO TABS: 20.0000 mg | ORAL_TABLET | Freq: Two times a day (BID) | ORAL | 1 refills | Status: DC

## 2019-05-10 NOTE — Patient Instructions (Addendum)
Allergic reaction Allergic reaction versus acid reflux versus esophageal spasm/dysphagia.  Food allergen skin tests today were negative.  The negative predictive value of food allergen skin testing is excellent.  However, to be thorough we will proceed with lab work.  The following labs have been ordered: Baseline serum tryptase, CBC, CMP, ESR, ANA, and serum specific IgE against peanut with reflex components, tree nut panel, cherry, kumquat, and alpha-gal panel.   Should symptoms recur, a journal is to be kept recording any foods eaten, beverages consumed, medications taken within a 6 hour period prior to the onset of symptoms, as well as activities performed, and environmental conditions. For any symptoms concerning for anaphylaxis, epinephrine is to be administered and 911 is to be called immediately.  GERD  Appropriate reflux lifestyle modifications have been provided.  Continue pantoprazole 40 mg daily.  For now, add famotidine (Pepcid) 20 mg twice daily.   When lab results have returned you will be called with further recommendations. With the newly implemented Cures Act, the labs may be visible to you at the same time they become visible to [place]. However, the results will typically not be addressed until all of the results are back, so please be patient.  Until you have heard from [place], please continue the treatment plan as outlined on your take home sheet.  Lifestyle Changes for Controlling GERD  When you have GERD, stomach acid feels as if it's backing up toward your mouth. Whether or not you take medication to control your GERD, your symptoms can often be improved with lifestyle changes.   Raise Your Head  Reflux is more likely to strike when you're lying down flat, because stomach fluid can  flow backward more easily. Raising the head of your bed 4-6 inches can help. To do this:  Slide blocks or books under the legs at the head of your bed. Or, place a wedge under  the  mattress. Many foam stores can make a suitable wedge for you. The wedge  should run from your waist to the top of your head.  Don't just prop your head on several pillows. This increases pressure on your  stomach. It can make GERD worse.  Watch Your Eating Habits Certain foods may increase the acid in your stomach or relax the lower esophageal sphincter, making GERD more likely. It's best to avoid the following:  Coffee, tea, and carbonated drinks (with and without caffeine)  Fatty, fried, or spicy food  Mint, chocolate, onions, and tomatoes  Any other foods that seem to irritate your stomach or cause you pain  Relieve the Pressure  Eat smaller meals, even if you have to eat more often.  Don't lie down right after you eat. Wait a few hours for your stomach to empty.  Avoid tight belts and tight-fitting clothes.  Lose excess weight.  Tobacco and Alcohol  Avoid smoking tobacco and drinking alcohol. They can make GERD symptoms worse.

## 2019-05-10 NOTE — Assessment & Plan Note (Addendum)
Allergic reaction versus acid reflux versus esophageal spasm/dysphagia.  Food allergen skin tests today were negative.  The negative predictive value of food allergen skin testing is excellent.  However, to be thorough we will proceed with lab work.  The following labs have been ordered: Baseline serum tryptase, CBC, CMP, ESR, ANA, and serum specific IgE against peanut with reflex components, tree nut panel, cherry, kumquat, and alpha-gal panel.   Should symptoms recur, a journal is to be kept recording any foods eaten, beverages consumed, medications taken within a 6 hour period prior to the onset of symptoms, as well as activities performed, and environmental conditions. For any symptoms concerning for anaphylaxis, epinephrine is to be administered and 911 is to be called immediately.

## 2019-05-10 NOTE — Progress Notes (Signed)
Error

## 2019-05-10 NOTE — Assessment & Plan Note (Signed)
   Appropriate reflux lifestyle modifications have been provided.  Continue pantoprazole 40 mg daily.  For now, add famotidine (Pepcid) 20 mg twice daily.

## 2019-05-10 NOTE — Progress Notes (Signed)
New Patient Note  RE: Douglas [name] MRN: [id number] DOB: 1940-07-15 Date of Office Visit: 05/10/2019  Referring provider: Crist Infante, MD Primary care provider: Crist Infante, MD  Chief Complaint: Allergic Reaction, dysphagia    History of present illness: Douglas [name] is a 79 y.o. male seen today in consultation requested by Crist Infante, MD.  He reports that over the past 10 years he has experienced 7 episodes in which he "absolutely cannot breathe, cannot breathe at all."  He notes that during the episodes he is able to gasp for air though it feels like "air can not move through."  He describes the feeling of his throat "closing up with spasms."  Each episode "feels like it lasts forever, but probably lasts for less than 2 minutes."  During the first 3 episodes, he was awakened from sleep at night and believes these episodes do have been associated with acid reflux.  He was started on AcipHex and while on this medication he did not have a similar episode.  The proton pump inhibitor was discontinued however because of osteoporosis.  The fourth and fifth episodes occurred while consuming a kumquat and cherries, respectively.  He believes that these 2 episodes may also have been related to esophageal reflux given the acidity of the foods.  In October 2019, under the care of Dr. Fuller Plan, he underwent upper GI endoscopy with esophageal dilation and was started on Protonix 40 mg daily.  Hiatal hernia was noted on EGD.  The sixth and seventh episode occurred during January 2021 while consuming raw peanuts.  He did not experience concomitant urticaria, angioedema of the lips, tongue, or eyelids, nausea, vomiting, or diarrhea.  He reports that over the past year he has consumed "about a pound" of peanuts and/or tree nuts without symptoms.  He currently has access to an epinephrine autoinjector 2 pack.  Assessment and plan: Allergic reaction Allergic reaction versus acid reflux versus esophageal  spasm/dysphagia.  Food allergen skin tests today were negative.  The negative predictive value of food allergen skin testing is excellent.  However, to be thorough we will proceed with lab work.  The following labs have been ordered: Baseline serum tryptase, CBC, CMP, ESR, ANA, and serum specific IgE against peanut with reflex components, tree nut panel, cherry, kumquat, and alpha-gal panel.   Should symptoms recur, a journal is to be kept recording any foods eaten, beverages consumed, medications taken within a 6 hour period prior to the onset of symptoms, as well as activities performed, and environmental conditions. For any symptoms concerning for anaphylaxis, epinephrine is to be administered and 911 is to be called immediately.  GERD  Appropriate reflux lifestyle modifications have been provided.  Continue pantoprazole 40 mg daily.  For now, add famotidine (Pepcid) 20 mg twice daily.   Meds ordered this encounter  Medications  . famotidine (PEPCID) 20 MG tablet    Sig: Take 1 tablet (20 mg total) by mouth 2 (two) times daily.    Dispense:  180 tablet    Refill:  1    Diagnostics: Food allergen skin testing: Negative.    Physical examination: Blood pressure (!) 164/80, pulse 72, temperature (!) 97.4 F (36.3 C), temperature source Temporal, resp. rate 18, height 5' 8.6" (1.742 m), weight 194 lb 9.6 oz (88.3 kg), SpO2 99 %.  General: Alert, interactive, in no acute distress. HEENT: Post-pharynx unremarkable. Neck: Supple without lymphadenopathy. Lungs: Clear to auscultation without wheezing, rhonchi or rales. CV: Normal S1, S2 without  murmurs. Abdomen: Nondistended, nontender. Skin: Warm and dry, without lesions or rashes. Extremities:  No clubbing, cyanosis or edema. Neuro:   Grossly intact.  Review of systems:  Review of systems negative except as noted in HPI / PMHx or noted below: Review of Systems  Constitutional: Negative.   HENT: Negative.   Eyes: Negative.     Respiratory: Negative.   Cardiovascular: Negative.   Gastrointestinal: Negative.   Genitourinary: Negative.   Musculoskeletal: Negative.   Skin: Negative.   Neurological: Negative.   Endo/Heme/Allergies: Negative.   Psychiatric/Behavioral: Negative.     Past medical history:  Past Medical History:  Diagnosis Date  . Allergy    seasonal  . Anemia   . Bilateral inguinal hernia   . Chronic kidney disease    h/o- renal calculi- passed spont. , 10 episodes over the course of 20 yrs.   Marland Kitchen GERD (gastroesophageal reflux disease)   . Hyperlipidemia   . Hypertension   . Hypothyroidism   . Osteoporosis   . Sleep apnea    CPAP q night - study done 8-10 yrs. ago   . Testosterone deficiency    injections q 3 week- July 8- last injection   . Thyroid disease     Past surgical history:  Past Surgical History:  Procedure Laterality Date  . ANAL FISSURE REPAIR    . CHOLECYSTECTOMY    . COLONOSCOPY    . FIBULA FRACTURE SURGERY     fibula and tibia fracture   . FRACTURE SURGERY     L leg -plate & screws - ORIF- fibula  . HEMORRHOID SURGERY    . HERNIA REPAIR  04/19/11   bilateral  . LAPAROSCOPIC INGUINAL HERNIA REPAIR  04/19/11   bilateral  . LAPAROSCOPY  10/11/2011   Procedure: LAPAROSCOPY DIAGNOSTIC;  Surgeon: Adin Hector, MD;  Location: [facility];  Service: General;  Laterality: N/A;  UMBILICAL EXPLORATION  . MASS EXCISION  10/11/2011   Procedure: EXCISION MASS;  Surgeon: Adin Hector, MD;  Location: [facility];  Service: General;  Laterality: N/A;  EXCISION OF UMBILICAL MASS  . POLYPECTOMY      Family history: Family History  Problem Relation Age of Onset  . Stroke Mother   . Stroke Father   . Colon cancer Neg Hx   . Rectal cancer Neg Hx   . Stomach cancer Neg Hx   . Esophageal cancer Neg Hx   . Colon polyps Neg Hx   . Allergic rhinitis Neg Hx   . Asthma Neg Hx   . Angioedema Neg Hx   . Atopy Neg Hx   . Eczema Neg Hx   . Immunodeficiency Neg Hx   . Urticaria Neg Hx      Social history: Social History   Socioeconomic History  . Marital status: Married    Spouse name: Not on file  . Number of children: Not on file  . Years of education: Not on file  . Highest education level: Not on file  Occupational History  . Not on file  Tobacco Use  . Smoking status: Former Smoker    Quit date: 06/05/1998    Years since quitting: 20.9  . Smokeless tobacco: Never Used  Substance and Sexual Activity  . Alcohol use: No  . Drug use: No  . Sexual activity: Not on file  Other Topics Concern  . Not on file  Social History Narrative  . Not on file   Social Determinants of Health   Financial Resource Strain:   .  Difficulty of Paying Living Expenses: Not on file  Food Insecurity:   . Worried About Charity fundraiser in the Last Year: Not on file  . Ran Out of Food in the Last Year: Not on file  Transportation Needs:   . Lack of Transportation (Medical): Not on file  . Lack of Transportation (Non-Medical): Not on file  Physical Activity:   . Days of Exercise per Week: Not on file  . Minutes of Exercise per Session: Not on file  Stress:   . Feeling of Stress : Not on file  Social Connections:   . Frequency of Communication with Friends and Family: Not on file  . Frequency of Social Gatherings with Friends and Family: Not on file  . Attends Religious Services: Not on file  . Active Member of Clubs or Organizations: Not on file  . Attends Archivist Meetings: Not on file  . Marital Status: Not on file  Intimate Partner Violence:   . Fear of Current or Ex-Partner: Not on file  . Emotionally Abused: Not on file  . Physically Abused: Not on file  . Sexually Abused: Not on file    Environmental History: The patient lives in a 77 year old house with carpeting in the bedroom, gas heat, and central air.  There is no known mold/water damage in the home.  There are no pets in the home.  He is a former cigarette smoker having started 45 and quit in  1999 with a 32-pack-year history.  Current Outpatient Medications  Medication Sig Dispense Refill  . Calcium Carbonate-Vitamin D (CALCIUM-VITAMIN D) 600-125 MG-UNIT TABS Take by mouth daily.    . cetirizine (ZYRTEC) 5 MG tablet Take 10 mg by mouth daily.     . cholecalciferol (VITAMIN D) 1000 UNITS tablet Take 1,000 Units by mouth 2 (two) times daily.    Marland Kitchen EPINEPHrine (EPIPEN 2-PAK) 0.3 mg/0.3 mL IJ SOAJ injection Inject 0.3 mg into the muscle as needed for anaphylaxis.    . fish oil-omega-3 fatty acids 1000 MG capsule Take 1 g by mouth 4 (four) times daily.     Marland Kitchen levothyroxine (SYNTHROID, LEVOTHROID) 112 MCG tablet Take 112 mcg by mouth daily.    . metroNIDAZOLE (METROGEL) 0.75 % gel Apply 1 application topically 2 (two) times daily.    . Multiple Vitamin (MULTIVITAMIN PO) Take by mouth daily.      Marland Kitchen OVER THE COUNTER MEDICATION Magnesium 568m po daily    . pantoprazole (PROTONIX) 40 MG tablet Take 40 mg by mouth daily.    . simvastatin (ZOCOR) 40 MG tablet Take 40 mg by mouth every evening.    .Marland Kitchentelmisartan (MICARDIS) 80 MG tablet Take 80 mg by mouth daily.    .Marland Kitchentestosterone cypionate (DEPOTESTOTERONE CYPIONATE) 100 MG/ML injection Inject into the muscle every 21 ( twenty-one) days. For IM use only    . famotidine (PEPCID) 20 MG tablet Take 1 tablet (20 mg total) by mouth 2 (two) times daily. 180 tablet 1   No current facility-administered medications for this visit.    Known medication allergies: No Known Allergies  I appreciate the opportunity to take part in Keghan's care. Please do not hesitate to contact me with questions.  Sincerely,   R. CEdgar Frisk MD

## 2019-05-15 LAB — CBC WITH DIFFERENTIAL
Basophils Absolute: 0.1 10*3/uL (ref 0.0–0.2)
Basos: 1 %
EOS (ABSOLUTE): 0.1 10*3/uL (ref 0.0–0.4)
Eos: 2 %
Hematocrit: 43.3 % (ref 37.5–51.0)
Hemoglobin: 13.5 g/dL (ref 13.0–17.7)
Immature Grans (Abs): 0 10*3/uL (ref 0.0–0.1)
Immature Granulocytes: 0 %
Lymphocytes Absolute: 1.3 10*3/uL (ref 0.7–3.1)
Lymphs: 22 %
MCH: 25.5 pg — ABNORMAL LOW (ref 26.6–33.0)
MCHC: 31.2 g/dL — ABNORMAL LOW (ref 31.5–35.7)
MCV: 82 fL (ref 79–97)
Monocytes Absolute: 0.8 10*3/uL (ref 0.1–0.9)
Monocytes: 12 %
Neutrophils Absolute: 3.9 10*3/uL (ref 1.4–7.0)
Neutrophils: 63 %
RBC: 5.3 x10E6/uL (ref 4.14–5.80)
RDW: 17.7 % — ABNORMAL HIGH (ref 11.6–15.4)
WBC: 6.2 10*3/uL (ref 3.4–10.8)

## 2019-05-15 LAB — ANA W/REFLEX: Anti Nuclear Antibody (ANA): POSITIVE — AB

## 2019-05-15 LAB — ALLERGEN, CHERRY, F242: F242-IgE Bing Cherry: <0.1 kU/L

## 2019-05-15 LAB — COMPREHENSIVE METABOLIC PANEL
ALT: 22 IU/L (ref 0–44)
AST: 23 IU/L (ref 0–40)
Albumin/Globulin Ratio: 1.9 (ref 1.2–2.2)
Albumin: 4.5 g/dL (ref 3.7–4.7)
Alkaline Phosphatase: 88 IU/L (ref 39–117)
BUN/Creatinine Ratio: 14 (ref 10–24)
BUN: 16 mg/dL (ref 8–27)
Bilirubin Total: 0.3 mg/dL (ref 0.0–1.2)
CO2: 25 mmol/L (ref 20–29)
Calcium: 9.4 mg/dL (ref 8.6–10.2)
Chloride: 105 mmol/L (ref 96–106)
Creatinine, Ser: 1.17 mg/dL (ref 0.76–1.27)
GFR calc Af Amer: 70 mL/min/{1.73_m2} (ref >59)
GFR calc non Af Amer: 60 mL/min/{1.73_m2} (ref >59)
Globulin, Total: 2.4 g/dL (ref 1.5–4.5)
Glucose: 86 mg/dL (ref 65–99)
Potassium: 4.3 mmol/L (ref 3.5–5.2)
Sodium: 144 mmol/L (ref 134–144)
Total Protein: 6.9 g/dL (ref 6.0–8.5)

## 2019-05-15 LAB — ALPHA-GAL PANEL
Alpha Gal IgE*: 1.76 kU/L — ABNORMAL HIGH (ref <0.10)
Beef (Bos spp) IgE: 0.88 kU/L — ABNORMAL HIGH (ref <0.35)
Class Interpretation: 1
Class Interpretation: 2
Lamb/Mutton (Ovis spp) IgE: 0.13 kU/L (ref <0.35)
Pork (Sus spp) IgE: 0.47 kU/L — ABNORMAL HIGH (ref <0.35)

## 2019-05-15 LAB — TRYPTASE: Tryptase: 6.6 ug/L (ref 2.2–13.2)

## 2019-05-15 LAB — ALLERGENS(7)
Brazil Nut IgE: <0.1 kU/L
F020-IgE Almond: 0.12 kU/L — AB
F202-IgE Cashew Nut: <0.1 kU/L
Hazelnut (Filbert) IgE: <0.1 kU/L
Pecan Nut IgE: <0.1 kU/L
Walnut IgE: <0.1 kU/L

## 2019-05-15 LAB — SEDIMENTATION RATE: Sed Rate: 39 mm/hr — ABNORMAL HIGH (ref 0–30)

## 2019-05-15 LAB — ENA+DNA/DS+SJORGEN'S
ENA RNP Ab: 1.2 AI — ABNORMAL HIGH (ref 0.0–0.9)
ENA SM Ab Ser-aCnc: <0.2 AI (ref 0.0–0.9)
ENA SSA (RO) Ab: <0.2 AI (ref 0.0–0.9)
ENA SSB (LA) Ab: <0.2 AI (ref 0.0–0.9)
dsDNA Ab: <1 IU/mL (ref 0–9)

## 2019-05-15 LAB — IGE PEANUT W/COMPONENT REFLEX: Peanut, IgE: <0.1 kU/L

## 2019-05-19 DIAGNOSIS — E291 Testicular hypofunction: Secondary | ICD-10-CM | POA: Diagnosis not present

## 2019-06-09 DIAGNOSIS — E291 Testicular hypofunction: Secondary | ICD-10-CM | POA: Diagnosis not present

## 2019-06-15 ENCOUNTER — Other Ambulatory Visit (HOSPITAL_COMMUNITY): Payer: Self-pay | Admitting: *Deleted

## 2019-06-16 ENCOUNTER — Other Ambulatory Visit: Payer: Self-pay

## 2019-06-16 ENCOUNTER — Encounter (HOSPITAL_COMMUNITY)
Admission: RE | Admit: 2019-06-16 | Discharge: 2019-06-16 | Disposition: A | Discharge status: Home / Self Care | Payer: Medicare Other | Source: Ambulatory Visit | Attending: Internal Medicine | Admitting: Internal Medicine

## 2019-06-16 DIAGNOSIS — M810 Age-related osteoporosis without current pathological fracture: Secondary | ICD-10-CM | POA: Diagnosis not present

## 2019-06-16 MED ORDER — DENOSUMAB 60 MG/ML ~~LOC~~ SOSY
60.0000 mg | PREFILLED_SYRINGE | Freq: Once | SUBCUTANEOUS | Status: AC
  Administered 2019-06-16: 60 mg via SUBCUTANEOUS

## 2019-06-16 MED ORDER — DENOSUMAB 60 MG/ML ~~LOC~~ SOSY
PREFILLED_SYRINGE | SUBCUTANEOUS | Status: AC
  Filled 2019-06-16: qty 1

## 2019-06-30 DIAGNOSIS — E291 Testicular hypofunction: Secondary | ICD-10-CM | POA: Diagnosis not present

## 2019-07-21 DIAGNOSIS — E291 Testicular hypofunction: Secondary | ICD-10-CM | POA: Diagnosis not present

## 2019-07-21 DIAGNOSIS — E038 Other specified hypothyroidism: Secondary | ICD-10-CM | POA: Diagnosis not present

## 2019-08-11 DIAGNOSIS — E291 Testicular hypofunction: Secondary | ICD-10-CM | POA: Diagnosis not present

## 2019-09-01 DIAGNOSIS — E291 Testicular hypofunction: Secondary | ICD-10-CM | POA: Diagnosis not present

## 2019-10-13 DIAGNOSIS — E291 Testicular hypofunction: Secondary | ICD-10-CM | POA: Diagnosis not present

## 2019-10-14 DIAGNOSIS — L918 Other hypertrophic disorders of the skin: Secondary | ICD-10-CM | POA: Diagnosis not present

## 2019-10-14 DIAGNOSIS — D1801 Hemangioma of skin and subcutaneous tissue: Secondary | ICD-10-CM | POA: Diagnosis not present

## 2019-10-14 DIAGNOSIS — L821 Other seborrheic keratosis: Secondary | ICD-10-CM | POA: Diagnosis not present

## 2019-10-14 DIAGNOSIS — L570 Actinic keratosis: Secondary | ICD-10-CM | POA: Diagnosis not present

## 2019-10-14 DIAGNOSIS — L814 Other melanin hyperpigmentation: Secondary | ICD-10-CM | POA: Diagnosis not present

## 2019-11-02 DIAGNOSIS — E291 Testicular hypofunction: Secondary | ICD-10-CM | POA: Diagnosis not present

## 2019-12-13 DIAGNOSIS — E785 Hyperlipidemia, unspecified: Secondary | ICD-10-CM | POA: Diagnosis not present

## 2019-12-13 DIAGNOSIS — E291 Testicular hypofunction: Secondary | ICD-10-CM | POA: Diagnosis not present

## 2019-12-13 DIAGNOSIS — E559 Vitamin D deficiency, unspecified: Secondary | ICD-10-CM | POA: Diagnosis not present

## 2019-12-13 DIAGNOSIS — Z125 Encounter for screening for malignant neoplasm of prostate: Secondary | ICD-10-CM | POA: Diagnosis not present

## 2019-12-13 DIAGNOSIS — E1169 Type 2 diabetes mellitus with other specified complication: Secondary | ICD-10-CM | POA: Diagnosis not present

## 2019-12-13 DIAGNOSIS — E039 Hypothyroidism, unspecified: Secondary | ICD-10-CM | POA: Diagnosis not present

## 2019-12-20 DIAGNOSIS — E039 Hypothyroidism, unspecified: Secondary | ICD-10-CM | POA: Diagnosis not present

## 2019-12-20 DIAGNOSIS — E1169 Type 2 diabetes mellitus with other specified complication: Secondary | ICD-10-CM | POA: Diagnosis not present

## 2019-12-20 DIAGNOSIS — E785 Hyperlipidemia, unspecified: Secondary | ICD-10-CM | POA: Diagnosis not present

## 2019-12-20 DIAGNOSIS — N1831 Chronic kidney disease, stage 3a: Secondary | ICD-10-CM | POA: Diagnosis not present

## 2019-12-20 DIAGNOSIS — Z23 Encounter for immunization: Secondary | ICD-10-CM | POA: Diagnosis not present

## 2019-12-20 DIAGNOSIS — R82998 Other abnormal findings in urine: Secondary | ICD-10-CM | POA: Diagnosis not present

## 2019-12-20 DIAGNOSIS — R9389 Abnormal findings on diagnostic imaging of other specified body structures: Secondary | ICD-10-CM | POA: Diagnosis not present

## 2019-12-20 DIAGNOSIS — Z0000 Encounter for general adult medical examination without abnormal findings: Secondary | ICD-10-CM | POA: Diagnosis not present

## 2019-12-20 DIAGNOSIS — M810 Age-related osteoporosis without current pathological fracture: Secondary | ICD-10-CM | POA: Diagnosis not present

## 2019-12-20 DIAGNOSIS — E291 Testicular hypofunction: Secondary | ICD-10-CM | POA: Diagnosis not present

## 2019-12-20 DIAGNOSIS — E559 Vitamin D deficiency, unspecified: Secondary | ICD-10-CM | POA: Diagnosis not present

## 2019-12-20 DIAGNOSIS — G4733 Obstructive sleep apnea (adult) (pediatric): Secondary | ICD-10-CM | POA: Diagnosis not present

## 2019-12-20 DIAGNOSIS — I10 Essential (primary) hypertension: Secondary | ICD-10-CM | POA: Diagnosis not present

## 2019-12-21 DIAGNOSIS — Z1212 Encounter for screening for malignant neoplasm of rectum: Secondary | ICD-10-CM | POA: Diagnosis not present

## 2020-01-03 ENCOUNTER — Encounter: Payer: Self-pay | Admitting: Gastroenterology

## 2020-01-07 ENCOUNTER — Other Ambulatory Visit: Payer: Self-pay | Admitting: Internal Medicine

## 2020-01-07 ENCOUNTER — Other Ambulatory Visit (HOSPITAL_COMMUNITY): Payer: Self-pay | Admitting: *Deleted

## 2020-01-07 DIAGNOSIS — J8410 Pulmonary fibrosis, unspecified: Secondary | ICD-10-CM

## 2020-01-10 ENCOUNTER — Encounter (HOSPITAL_COMMUNITY)
Admission: RE | Admit: 2020-01-10 | Discharge: 2020-01-10 | Disposition: A | Discharge status: Home / Self Care | Payer: Medicare Other | Source: Ambulatory Visit | Attending: Internal Medicine | Admitting: Internal Medicine

## 2020-01-10 ENCOUNTER — Other Ambulatory Visit: Payer: Self-pay

## 2020-01-10 DIAGNOSIS — M810 Age-related osteoporosis without current pathological fracture: Secondary | ICD-10-CM | POA: Insufficient documentation

## 2020-01-10 MED ORDER — DENOSUMAB 60 MG/ML ~~LOC~~ SOSY
60.0000 mg | PREFILLED_SYRINGE | Freq: Once | SUBCUTANEOUS | Status: AC
  Administered 2020-01-10: 60 mg via SUBCUTANEOUS

## 2020-01-10 MED ORDER — DENOSUMAB 60 MG/ML ~~LOC~~ SOSY
PREFILLED_SYRINGE | SUBCUTANEOUS | Status: AC
  Filled 2020-01-10: qty 1

## 2020-01-11 DIAGNOSIS — H2513 Age-related nuclear cataract, bilateral: Secondary | ICD-10-CM | POA: Diagnosis not present

## 2020-01-11 DIAGNOSIS — H5213 Myopia, bilateral: Secondary | ICD-10-CM | POA: Diagnosis not present

## 2020-01-11 DIAGNOSIS — H21233 Degeneration of iris (pigmentary), bilateral: Secondary | ICD-10-CM | POA: Diagnosis not present

## 2020-01-11 DIAGNOSIS — H40013 Open angle with borderline findings, low risk, bilateral: Secondary | ICD-10-CM | POA: Diagnosis not present

## 2020-01-19 DIAGNOSIS — Z23 Encounter for immunization: Secondary | ICD-10-CM | POA: Diagnosis not present

## 2020-01-24 ENCOUNTER — Ambulatory Visit
Admission: RE | Admit: 2020-01-24 | Discharge: 2020-01-24 | Disposition: A | Discharge status: Home / Self Care | Payer: Medicare Other | Source: Ambulatory Visit | Attending: Internal Medicine | Admitting: Internal Medicine

## 2020-01-24 DIAGNOSIS — J432 Centrilobular emphysema: Secondary | ICD-10-CM | POA: Diagnosis not present

## 2020-01-24 DIAGNOSIS — I2510 Atherosclerotic heart disease of native coronary artery without angina pectoris: Secondary | ICD-10-CM | POA: Diagnosis not present

## 2020-01-24 DIAGNOSIS — J84112 Idiopathic pulmonary fibrosis: Secondary | ICD-10-CM | POA: Diagnosis not present

## 2020-01-24 DIAGNOSIS — J8410 Pulmonary fibrosis, unspecified: Secondary | ICD-10-CM

## 2020-01-24 DIAGNOSIS — J984 Other disorders of lung: Secondary | ICD-10-CM | POA: Diagnosis not present

## 2020-02-25 ENCOUNTER — Encounter: Payer: Medicare Other | Admitting: Gastroenterology

## 2020-02-29 DIAGNOSIS — R911 Solitary pulmonary nodule: Secondary | ICD-10-CM | POA: Diagnosis not present

## 2020-02-29 DIAGNOSIS — J849 Interstitial pulmonary disease, unspecified: Secondary | ICD-10-CM | POA: Diagnosis not present

## 2020-02-29 DIAGNOSIS — J432 Centrilobular emphysema: Secondary | ICD-10-CM | POA: Diagnosis not present

## 2020-04-19 DIAGNOSIS — E039 Hypothyroidism, unspecified: Secondary | ICD-10-CM | POA: Diagnosis not present

## 2020-04-20 DIAGNOSIS — J849 Interstitial pulmonary disease, unspecified: Secondary | ICD-10-CM | POA: Diagnosis not present

## 2020-04-20 DIAGNOSIS — Z87891 Personal history of nicotine dependence: Secondary | ICD-10-CM | POA: Diagnosis not present

## 2020-04-20 DIAGNOSIS — J432 Centrilobular emphysema: Secondary | ICD-10-CM | POA: Diagnosis not present

## 2020-05-01 DIAGNOSIS — J432 Centrilobular emphysema: Secondary | ICD-10-CM | POA: Diagnosis not present

## 2020-05-01 DIAGNOSIS — J849 Interstitial pulmonary disease, unspecified: Secondary | ICD-10-CM | POA: Diagnosis not present

## 2020-05-01 DIAGNOSIS — R911 Solitary pulmonary nodule: Secondary | ICD-10-CM | POA: Diagnosis not present

## 2020-06-19 DIAGNOSIS — E291 Testicular hypofunction: Secondary | ICD-10-CM | POA: Diagnosis not present

## 2020-06-19 DIAGNOSIS — J309 Allergic rhinitis, unspecified: Secondary | ICD-10-CM | POA: Diagnosis not present

## 2020-06-19 DIAGNOSIS — E1169 Type 2 diabetes mellitus with other specified complication: Secondary | ICD-10-CM | POA: Diagnosis not present

## 2020-06-19 DIAGNOSIS — K219 Gastro-esophageal reflux disease without esophagitis: Secondary | ICD-10-CM | POA: Diagnosis not present

## 2020-06-19 DIAGNOSIS — I129 Hypertensive chronic kidney disease with stage 1 through stage 4 chronic kidney disease, or unspecified chronic kidney disease: Secondary | ICD-10-CM | POA: Diagnosis not present

## 2020-06-19 DIAGNOSIS — G4733 Obstructive sleep apnea (adult) (pediatric): Secondary | ICD-10-CM | POA: Diagnosis not present

## 2020-06-19 DIAGNOSIS — J8410 Pulmonary fibrosis, unspecified: Secondary | ICD-10-CM | POA: Diagnosis not present

## 2020-06-19 DIAGNOSIS — E785 Hyperlipidemia, unspecified: Secondary | ICD-10-CM | POA: Diagnosis not present

## 2020-06-19 DIAGNOSIS — E039 Hypothyroidism, unspecified: Secondary | ICD-10-CM | POA: Diagnosis not present

## 2020-06-19 DIAGNOSIS — D649 Anemia, unspecified: Secondary | ICD-10-CM | POA: Diagnosis not present

## 2020-06-19 DIAGNOSIS — M810 Age-related osteoporosis without current pathological fracture: Secondary | ICD-10-CM | POA: Diagnosis not present

## 2020-06-20 DIAGNOSIS — E1169 Type 2 diabetes mellitus with other specified complication: Secondary | ICD-10-CM | POA: Diagnosis not present

## 2020-06-21 ENCOUNTER — Other Ambulatory Visit: Payer: Self-pay

## 2020-06-21 ENCOUNTER — Inpatient Hospital Stay: Payer: Medicare Other | Attending: Hematology | Admitting: Hematology

## 2020-06-21 ENCOUNTER — Inpatient Hospital Stay: Payer: Medicare Other

## 2020-06-21 ENCOUNTER — Telehealth: Payer: Self-pay | Admitting: Hematology

## 2020-06-21 VITALS — BP 149/77 | HR 66 | Temp 97.9°F | Resp 18 | Ht 70.0 in | Wt 189.6 lb

## 2020-06-21 DIAGNOSIS — I129 Hypertensive chronic kidney disease with stage 1 through stage 4 chronic kidney disease, or unspecified chronic kidney disease: Secondary | ICD-10-CM | POA: Insufficient documentation

## 2020-06-21 DIAGNOSIS — D696 Thrombocytopenia, unspecified: Secondary | ICD-10-CM

## 2020-06-21 DIAGNOSIS — Z79899 Other long term (current) drug therapy: Secondary | ICD-10-CM | POA: Diagnosis not present

## 2020-06-21 DIAGNOSIS — R768 Other specified abnormal immunological findings in serum: Secondary | ICD-10-CM | POA: Diagnosis not present

## 2020-06-21 DIAGNOSIS — N189 Chronic kidney disease, unspecified: Secondary | ICD-10-CM | POA: Diagnosis not present

## 2020-06-21 DIAGNOSIS — K219 Gastro-esophageal reflux disease without esophagitis: Secondary | ICD-10-CM | POA: Insufficient documentation

## 2020-06-21 DIAGNOSIS — E039 Hypothyroidism, unspecified: Secondary | ICD-10-CM | POA: Diagnosis not present

## 2020-06-21 DIAGNOSIS — D693 Immune thrombocytopenic purpura: Secondary | ICD-10-CM | POA: Insufficient documentation

## 2020-06-21 DIAGNOSIS — E785 Hyperlipidemia, unspecified: Secondary | ICD-10-CM | POA: Insufficient documentation

## 2020-06-21 DIAGNOSIS — Z87891 Personal history of nicotine dependence: Secondary | ICD-10-CM | POA: Insufficient documentation

## 2020-06-21 LAB — CBC WITH DIFFERENTIAL/PLATELET
Abs Immature Granulocytes: 0.02 10*3/uL (ref 0.00–0.07)
Basophils Absolute: 0.1 10*3/uL (ref 0.0–0.1)
Basophils Relative: 1 %
Eosinophils Absolute: 0.2 10*3/uL (ref 0.0–0.5)
Eosinophils Relative: 2 %
HCT: 46.1 % (ref 39.0–52.0)
Hemoglobin: 14.5 g/dL (ref 13.0–17.0)
Immature Granulocytes: 0 %
Lymphocytes Relative: 15 %
Lymphs Abs: 1.2 10*3/uL (ref 0.7–4.0)
MCH: 25.8 pg — ABNORMAL LOW (ref 26.0–34.0)
MCHC: 31.5 g/dL (ref 30.0–36.0)
MCV: 82 fL (ref 80.0–100.0)
Monocytes Absolute: 0.7 10*3/uL (ref 0.1–1.0)
Monocytes Relative: 9 %
Neutro Abs: 5.7 10*3/uL (ref 1.7–7.7)
Neutrophils Relative %: 73 %
Platelets: 51 10*3/uL — ABNORMAL LOW (ref 150–400)
RBC: 5.62 MIL/uL (ref 4.22–5.81)
RDW: 16.2 % — ABNORMAL HIGH (ref 11.5–15.5)
WBC: 7.8 10*3/uL (ref 4.0–10.5)
nRBC: 0 % (ref 0.0–0.2)

## 2020-06-21 LAB — PROTIME-INR
INR: 1 (ref 0.8–1.2)
Prothrombin Time: 13 seconds (ref 11.4–15.2)

## 2020-06-21 LAB — CMP (CANCER CENTER ONLY)
ALT: 36 U/L (ref 0–44)
AST: 33 U/L (ref 15–41)
Albumin: 4.3 g/dL (ref 3.5–5.0)
Alkaline Phosphatase: 90 U/L (ref 38–126)
Anion gap: 9 (ref 5–15)
BUN: 18 mg/dL (ref 8–23)
CO2: 26 mmol/L (ref 22–32)
Calcium: 9.4 mg/dL (ref 8.9–10.3)
Chloride: 103 mmol/L (ref 98–111)
Creatinine: 1.45 mg/dL — ABNORMAL HIGH (ref 0.61–1.24)
GFR, Estimated: 50 mL/min — ABNORMAL LOW (ref >60)
Glucose, Bld: 83 mg/dL (ref 70–99)
Potassium: 4.4 mmol/L (ref 3.5–5.1)
Sodium: 138 mmol/L (ref 135–145)
Total Bilirubin: 0.7 mg/dL (ref 0.3–1.2)
Total Protein: 8.8 g/dL — ABNORMAL HIGH (ref 6.5–8.1)

## 2020-06-21 LAB — LACTATE DEHYDROGENASE: LDH: 254 U/L — ABNORMAL HIGH (ref 98–192)

## 2020-06-21 LAB — FIBRINOGEN: Fibrinogen: 354 mg/dL (ref 210–475)

## 2020-06-21 LAB — ABO/RH: ABO/RH(D): O POS

## 2020-06-21 LAB — VITAMIN B12: Vitamin B-12: 458 pg/mL (ref 180–914)

## 2020-06-21 LAB — IMMATURE PLATELET FRACTION: Immature Platelet Fraction: 9.7 % — ABNORMAL HIGH (ref 1.2–8.6)

## 2020-06-21 LAB — HEPATITIS C ANTIBODY: HCV Ab: NONREACTIVE

## 2020-06-21 LAB — PLATELET BY CITRATE

## 2020-06-21 LAB — APTT: aPTT: 28 seconds (ref 24–36)

## 2020-06-21 NOTE — Telephone Encounter (Signed)
Received an urgent hem referral from dr. Joylene Draft for thrombocytopenia. Douglas [name] has been cld and scheduled to see Dr. Irene Limbo today at 11am. Pt aware to arrive 20 minutes early.

## 2020-06-21 NOTE — Progress Notes (Signed)
HEMATOLOGY/ONCOLOGY CONSULTATION NOTE  Date of Service: 06/21/2020  Patient Care Team: Crist Infante, MD as PCP - General (Internal Medicine)  CHIEF COMPLAINTS/PURPOSE OF CONSULTATION:  Thrombocytopenia  HISTORY OF PRESENTING ILLNESS:   Douglas [name] is a wonderful 80 y.o. male who has been referred to [place] by Dr. Joylene Draft for evaluation and management of thrombocytopenia. The pt reports that she is doing well overall.  The pt reports that he was referred here for low Plt found during routine labs. He notes that his last labs six months prior to this were normal. The pt notes no bleeding issues. The pt notes he was working in the yard on Saturday and his left hand got swollen. It is now slowly getting better and turning blue in color. He notes no other symptoms alongside this and no pain. He notes he was not wearing gloves and was reaching into places that could have snakes or spiders. The pt notes he did not go the ED. This was prior to the pt's labs being drawn. The pt notes his wife puts a Benadryl on it and this helped to reduce the swelling. The pt notes that he taken Spiriva, but nothing has happened. The pt notes no other acute medication changes. The pt notes all other medical issues have been stable.  The pt notes he does not drink alcohol. The pt notes a possible Alpha Gal allergy, but denies any other food allergies. He eats lots of chicken and fresh fruits/vegetables. The pt denies any autoimmune conditions in his family and notes no change whatsoever in the last six months. The pt is unaware the cause of his hypothyroidism, but it is well controlled. He denies any concerns over liver abnormalities. The pt notes the Alpha Gal was diagnosed one year ago and he was experiencing SOB and difficulty breathing after eating red meat. He notes 2-3 episodes yearly for ten years and this was found in doing the panel for allergies. The pt notes no incidences since giving up beef and pork. The pt  had elevated IgE antibodies. The pt notes he tries to avoid NSAIDs and does not take any herbal supplements. The pt denies any need for transfusions or received any blood products.  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 The pt notes he was exposed to lead and acid, but not significantly. The pt notes that his daughter in law passed away of Leukemia two years ago.  Lab results 06/20/2020 of CBC w/diff and CMP is as follows: all values are WNL except for Plt of 39K, MCH of 25.8, MCHC of 30.5, RDW of 18.1. 12/13/2019 Plt of 289K.  On review of systems, pt reports recent animal bite on hand and denies new lumps/bumps, hand pain, bleeding issues, sudden weight loss, fevers, chills, recent vaccinations, antibiotic usage, sore throat, throat pain, runny nose, other acute skin rashes, unusual food intake, SOB, chest pain, changes in breathing, testicular pain/swelling, and any other symptoms.  MEDICAL HISTORY:  Past Medical History:  Diagnosis Date  . Allergy    seasonal  . Anemia   . Bilateral inguinal hernia   . Chronic kidney disease    h/o- renal calculi- passed spont. , 10 episodes over the course of 20 yrs.   Marland Kitchen GERD (gastroesophageal reflux disease)   . Hyperlipidemia   . Hypertension   . Hypothyroidism   . Osteoporosis   .  Sleep apnea    CPAP q night - study done 8-10 yrs. ago   . Testosterone deficiency    injections q 3 week- July 8- last injection   . Thyroid disease     SURGICAL HISTORY: Past Surgical History:  Procedure Laterality Date  . ANAL FISSURE REPAIR    . CHOLECYSTECTOMY    . COLONOSCOPY    . FIBULA FRACTURE SURGERY     fibula and tibia fracture   . FRACTURE SURGERY     L leg -plate & screws - ORIF- fibula  . HEMORRHOID SURGERY    . HERNIA REPAIR  04/19/11   bilateral  . LAPAROSCOPIC INGUINAL HERNIA REPAIR  04/19/11   bilateral   . LAPAROSCOPY  10/11/2011   Procedure: LAPAROSCOPY DIAGNOSTIC;  Surgeon: Adin Hector, MD;  Location: [place];  Service: General;  Laterality: N/A;  UMBILICAL EXPLORATION  . MASS EXCISION  10/11/2011   Procedure: EXCISION MASS;  Surgeon: Adin Hector, MD;  Location: [facility];  Service: General;  Laterality: N/A;  EXCISION OF UMBILICAL MASS  . POLYPECTOMY      SOCIAL HISTORY: Social History   Socioeconomic History  . Marital status: Married    Spouse name: Not on file  . Number of children: Not on file  . Years of education: Not on file  . Highest education level: Not on file  Occupational History  . Not on file  Tobacco Use  . Smoking status: Former Smoker    Quit date: 06/05/1998    Years since quitting: 22.0  . Smokeless tobacco: Never Used  Vaping Use  . Vaping Use: Never used  Substance and Sexual Activity  . Alcohol use: No  . Drug use: No  . Sexual activity: Not on file  Other Topics Concern  . Not on file  Social History Narrative  . Not on file   Social Determinants of Health   Financial Resource Strain: Not on file  Food Insecurity: Not on file  Transportation Needs: Not on file  Physical Activity: Not on file  Stress: Not on file  Social Connections: Not on file  Intimate Partner Violence: Not on file    FAMILY HISTORY: Family History  Problem Relation Age of Onset  . Stroke Mother   . Stroke Father   . Colon cancer Neg Hx   . Rectal cancer Neg Hx   . Stomach cancer Neg Hx   . Esophageal cancer Neg Hx   . Colon polyps Neg Hx   . Allergic rhinitis Neg Hx   . Asthma Neg Hx   . Angioedema Neg Hx   . Atopy Neg Hx   . Eczema Neg Hx   . Immunodeficiency Neg Hx   . Urticaria Neg Hx     ALLERGIES:  is allergic to alpha-gal.  MEDICATIONS:  Current Outpatient Medications  Medication Sig Dispense Refill  . Calcium Carbonate-Vitamin D (CALCIUM-VITAMIN D) 600-125 MG-UNIT TABS Take by mouth daily.    . cetirizine (ZYRTEC) 5 MG tablet Take 10 mg by  mouth daily.     . cholecalciferol (VITAMIN D) 1000 UNITS tablet Take 1,000 Units by mouth 2 (two) times daily.    Marland Kitchen denosumab (PROLIA) 60 MG/ML SOSY injection     . EPINEPHrine (EPIPEN 2-PAK) 0.3 mg/0.3 mL IJ SOAJ injection Inject 0.3 mg into the muscle as needed for anaphylaxis.    . famotidine (PEPCID) 20 MG tablet Take 1 tablet (20 mg total) by mouth 2 (two) times daily. 180 tablet 1  .  fish oil-omega-3 fatty acids 1000 MG capsule Take 1 g by mouth 4 (four) times daily.     Marland Kitchen levothyroxine (SYNTHROID) 125 MCG tablet Take 125 mcg by mouth every morning.    Marland Kitchen levothyroxine (SYNTHROID, LEVOTHROID) 112 MCG tablet Take 112 mcg by mouth daily.    . metroNIDAZOLE (METROGEL) 0.75 % gel Apply 1 application topically 2 (two) times daily.    . Multiple Vitamin (MULTIVITAMIN PO) Take by mouth daily.      Marland Kitchen OVER THE COUNTER MEDICATION Magnesium 500mg  po daily    . pantoprazole (PROTONIX) 40 MG tablet Take 40 mg by mouth daily.    . simvastatin (ZOCOR) 40 MG tablet Take 40 mg by mouth every evening.    Marland Kitchen telmisartan (MICARDIS) 80 MG tablet Take 80 mg by mouth daily.    Marland Kitchen testosterone cypionate (DEPOTESTOTERONE CYPIONATE) 100 MG/ML injection Inject into the muscle every 21 ( twenty-one) days. For IM use only     No current facility-administered medications for this visit.    REVIEW OF SYSTEMS:   10 Point review of Systems was done is negative except as noted above.  PHYSICAL EXAMINATION: ECOG PERFORMANCE STATUS: 0 - Asymptomatic  Vitals:   06/21/20 1055  BP: (!) 149/77  Pulse: 66  Resp: 18  Temp: 97.9 F (36.6 C)  SpO2: 100%   Filed Weights   06/21/20 1055  Weight: 189 lb 9.6 oz (86 kg)   .Body mass index is 27.2 kg/m.  GENERAL:alert, in no acute distress and comfortable SKIN: no acute rashes, no significant lesions EYES: conjunctiva are pink and non-injected, sclera anicteric OROPHARYNX: MMM, no exudates, no oropharyngeal erythema or ulceration NECK: supple, no JVD LYMPH:  no  palpable lymphadenopathy in the cervical, axillary or inguinal regions LUNGS: clear to auscultation b/l with normal respiratory effort HEART: regular rate & rhythm ABDOMEN:  normoactive bowel sounds , non tender, not distended. Extremity: no pedal edema PSYCH: alert & oriented x 3 with fluent speech NEURO: no focal motor/sensory deficits  LABORATORY DATA:  I have reviewed the data as listed  CBC Latest Ref Rng & Units 06/21/2020 06/21/2020 05/10/2019  WBC 4.0 - 10.5 K/uL 7.8 - 6.2  Hemoglobin 13.0 - 17.0 g/dL 14.5 - 13.5  Hematocrit 37.5 - 51.0 % 46.8 46.1 43.3  Platelets 150 - 400 K/uL 51(L) - -    CMP Latest Ref Rng & Units 06/21/2020 05/10/2019 10/09/2011  Glucose 70 - 99 mg/dL 83 86 80  BUN 8 - 23 mg/dL 18 16 15   Creatinine 0.61 - 1.24 mg/dL 1.45(H) 1.17 1.31  Sodium 135 - 145 mmol/L 138 144 139  Potassium 3.5 - 5.1 mmol/L 4.4 4.3 4.4  Chloride 98 - 111 mmol/L 103 105 104  CO2 22 - 32 mmol/L 26 25 30   Calcium 8.9 - 10.3 mg/dL 9.4 9.4 9.5  Total Protein 6.5 - 8.1 g/dL 8.8(H) 6.9 -  Total Bilirubin 0.3 - 1.2 mg/dL 0.7 0.3 -  Alkaline Phos 38 - 126 U/L 90 88 -  AST 15 - 41 U/L 33 23 -  ALT 0 - 44 U/L 36 22 -   Component     Latest Ref Rng & Units 06/21/2020  Folate, Hemolysate     Not Estab. ng/mL >620.0  HCT     37.5 - 51.0 % 46.8  Folate, RBC     >498 ng/mL >1,325  Prothrombin Time     11.4 - 15.2 seconds 13.0  INR     0.8 - 1.2 1.0  LDH     98 - 192 U/L 254 (H)  Immature Platelet Fraction     1.2 - 8.6 % 9.7 (H)  ABO/RH(D)      O POS . . .  HCV Ab     NON REACTIVE NON REACTIVE  Platelet CT in Citrate      EDTA platelet count consistent with citrate.  Fibrinogen     210 - 475 mg/dL 354  APTT     24 - 36 seconds 28  Haptoglobin     34 - 355 mg/dL 141  Vitamin B12     180 - 914 pg/mL 458    RADIOGRAPHIC STUDIES: I have personally reviewed the radiological images as listed and agreed with the findings in the report. No results found.  ASSESSMENT & PLAN:    80 y.o. with   1) Isolated thrombocytopenia Likely ITP PLAN: -Discussed potential causes for low Plt-- new infections, vaccines, etc that are reactive or if this is an issue with the bone marrow. -Advised pt that he has isolated thrombocytopenia and is not anemic or have decreased WBC. Advised pt that this is typically considered Immune Thrombocytopenia (ITP). -Discussed Hashimoto's Thyroiditis. Advised pt this could be driver for his hypothyroidism and causing some of the lower Plt. -Advised pt that we check plt in citrate to rule out them clumping together. Advised pt we will get an Immature Plt Fract to observe if he is not making enough or if they are over-making them. -Advised pt that treatment for ITP is typically when Plt close to or below 30K or at increased risk for bleeding. -Advised pt that normal Plt are between 150-400K. Bleeding with trauma is increased below 50K and spontaneous bleeding below 30K. -Will start on Prednisone for presumed ITP. -Will get labs today. -Will see back as needed pending results of labs.   FOLLOW UP: Labs Today   All of the patients questions were answered with apparent satisfaction. The patient knows to call the clinic with any problems, questions or concerns.  I spent 50 minutes counseling the patient face to face. The total time spent in the appointment was 60 minutes and more than 50% was on counseling and direct patient cares.    Sullivan Lone MD [place] AAHIVMS [facility] H[facility]Oncology Physician [facility]  (Office):       [phone] (Work cell):  [phone] (Fax):           [phone]  06/21/2020 11:50 AM  I, Reinaldo Raddle, am acting as scribe for Dr. Sullivan Lone, MD.   .I have reviewed the above documentation for accuracy and completeness, and I agree with the above. Brunetta Genera MD

## 2020-06-22 LAB — FOLATE RBC
Folate, Hemolysate: >620 ng/mL
Folate, RBC: >1325 ng/mL (ref >498)
Hematocrit: 46.8 % (ref 37.5–51.0)

## 2020-06-22 LAB — HAPTOGLOBIN: Haptoglobin: 141 mg/dL (ref 34–355)

## 2020-06-29 ENCOUNTER — Encounter: Payer: Self-pay | Admitting: Hematology

## 2020-06-29 ENCOUNTER — Other Ambulatory Visit: Payer: Self-pay | Admitting: Hematology

## 2020-06-29 ENCOUNTER — Other Ambulatory Visit: Payer: Self-pay

## 2020-06-29 DIAGNOSIS — D696 Thrombocytopenia, unspecified: Secondary | ICD-10-CM

## 2020-06-29 MED ORDER — PREDNISONE 20 MG PO TABS: ORAL_TABLET | ORAL | 0 refills | Status: DC

## 2020-06-30 ENCOUNTER — Other Ambulatory Visit: Payer: Self-pay

## 2020-06-30 ENCOUNTER — Inpatient Hospital Stay: Payer: Medicare Other | Attending: Hematology

## 2020-06-30 DIAGNOSIS — R768 Other specified abnormal immunological findings in serum: Secondary | ICD-10-CM | POA: Diagnosis not present

## 2020-06-30 DIAGNOSIS — D696 Thrombocytopenia, unspecified: Secondary | ICD-10-CM | POA: Diagnosis not present

## 2020-06-30 DIAGNOSIS — E785 Hyperlipidemia, unspecified: Secondary | ICD-10-CM | POA: Diagnosis not present

## 2020-06-30 DIAGNOSIS — K219 Gastro-esophageal reflux disease without esophagitis: Secondary | ICD-10-CM | POA: Diagnosis not present

## 2020-06-30 DIAGNOSIS — I10 Essential (primary) hypertension: Secondary | ICD-10-CM | POA: Diagnosis not present

## 2020-06-30 DIAGNOSIS — Z79899 Other long term (current) drug therapy: Secondary | ICD-10-CM | POA: Diagnosis not present

## 2020-06-30 DIAGNOSIS — Z87891 Personal history of nicotine dependence: Secondary | ICD-10-CM | POA: Insufficient documentation

## 2020-06-30 DIAGNOSIS — Z7952 Long term (current) use of systemic steroids: Secondary | ICD-10-CM | POA: Diagnosis not present

## 2020-06-30 DIAGNOSIS — M810 Age-related osteoporosis without current pathological fracture: Secondary | ICD-10-CM | POA: Diagnosis not present

## 2020-06-30 DIAGNOSIS — E063 Autoimmune thyroiditis: Secondary | ICD-10-CM | POA: Diagnosis not present

## 2020-06-30 DIAGNOSIS — N189 Chronic kidney disease, unspecified: Secondary | ICD-10-CM | POA: Diagnosis not present

## 2020-06-30 LAB — CBC WITH DIFFERENTIAL (CANCER CENTER ONLY)
Abs Immature Granulocytes: 0.12 10*3/uL — ABNORMAL HIGH (ref 0.00–0.07)
Basophils Absolute: 0.1 10*3/uL (ref 0.0–0.1)
Basophils Relative: 1 %
Eosinophils Absolute: 0 10*3/uL (ref 0.0–0.5)
Eosinophils Relative: 0 %
HCT: 43.9 % (ref 39.0–52.0)
Hemoglobin: 13.6 g/dL (ref 13.0–17.0)
Immature Granulocytes: 1 %
Lymphocytes Relative: 9 %
Lymphs Abs: 0.8 10*3/uL (ref 0.7–4.0)
MCH: 25.8 pg — ABNORMAL LOW (ref 26.0–34.0)
MCHC: 31 g/dL (ref 30.0–36.0)
MCV: 83.3 fL (ref 80.0–100.0)
Monocytes Absolute: 0.3 10*3/uL (ref 0.1–1.0)
Monocytes Relative: 3 %
Neutro Abs: 7.6 10*3/uL (ref 1.7–7.7)
Neutrophils Relative %: 86 %
Platelet Count: 113 10*3/uL — ABNORMAL LOW (ref 150–400)
RBC: 5.27 MIL/uL (ref 4.22–5.81)
RDW: 17.1 % — ABNORMAL HIGH (ref 11.5–15.5)
WBC Count: 8.8 10*3/uL (ref 4.0–10.5)
nRBC: 0 % (ref 0.0–0.2)

## 2020-06-30 LAB — CMP (CANCER CENTER ONLY)
ALT: 39 U/L (ref 0–44)
AST: 66 U/L — ABNORMAL HIGH (ref 15–41)
Albumin: 3.9 g/dL (ref 3.5–5.0)
Alkaline Phosphatase: 87 U/L (ref 38–126)
Anion gap: 10 (ref 5–15)
BUN: 14 mg/dL (ref 8–23)
CO2: 25 mmol/L (ref 22–32)
Calcium: 9 mg/dL (ref 8.9–10.3)
Chloride: 104 mmol/L (ref 98–111)
Creatinine: 1.32 mg/dL — ABNORMAL HIGH (ref 0.61–1.24)
GFR, Estimated: 56 mL/min — ABNORMAL LOW (ref >60)
Glucose, Bld: 130 mg/dL — ABNORMAL HIGH (ref 70–99)
Potassium: 4.8 mmol/L (ref 3.5–5.1)
Sodium: 139 mmol/L (ref 135–145)
Total Bilirubin: 0.5 mg/dL (ref 0.3–1.2)
Total Protein: 7.7 g/dL (ref 6.5–8.1)

## 2020-06-30 LAB — IMMATURE PLATELET FRACTION: Immature Platelet Fraction: 4.8 % (ref 1.2–8.6)

## 2020-07-07 ENCOUNTER — Other Ambulatory Visit: Payer: Medicare Other

## 2020-07-07 ENCOUNTER — Ambulatory Visit: Payer: Medicare Other | Admitting: Physician Assistant

## 2020-07-10 ENCOUNTER — Inpatient Hospital Stay (HOSPITAL_BASED_OUTPATIENT_CLINIC_OR_DEPARTMENT_OTHER): Payer: Medicare Other | Admitting: Physician Assistant

## 2020-07-10 ENCOUNTER — Inpatient Hospital Stay: Payer: Medicare Other

## 2020-07-10 ENCOUNTER — Other Ambulatory Visit: Payer: Self-pay

## 2020-07-10 DIAGNOSIS — E785 Hyperlipidemia, unspecified: Secondary | ICD-10-CM | POA: Diagnosis not present

## 2020-07-10 DIAGNOSIS — D696 Thrombocytopenia, unspecified: Secondary | ICD-10-CM | POA: Insufficient documentation

## 2020-07-10 DIAGNOSIS — M810 Age-related osteoporosis without current pathological fracture: Secondary | ICD-10-CM | POA: Diagnosis not present

## 2020-07-10 DIAGNOSIS — K219 Gastro-esophageal reflux disease without esophagitis: Secondary | ICD-10-CM | POA: Diagnosis not present

## 2020-07-10 DIAGNOSIS — I10 Essential (primary) hypertension: Secondary | ICD-10-CM | POA: Diagnosis not present

## 2020-07-10 DIAGNOSIS — Z7952 Long term (current) use of systemic steroids: Secondary | ICD-10-CM | POA: Diagnosis not present

## 2020-07-10 LAB — CBC WITH DIFFERENTIAL (CANCER CENTER ONLY)
Abs Immature Granulocytes: 0.15 10*3/uL — ABNORMAL HIGH (ref 0.00–0.07)
Basophils Absolute: 0.1 10*3/uL (ref 0.0–0.1)
Basophils Relative: 0 %
Eosinophils Absolute: 0.2 10*3/uL (ref 0.0–0.5)
Eosinophils Relative: 1 %
HCT: 44.2 % (ref 39.0–52.0)
Hemoglobin: 13.9 g/dL (ref 13.0–17.0)
Immature Granulocytes: 1 %
Lymphocytes Relative: 21 %
Lymphs Abs: 3.1 10*3/uL (ref 0.7–4.0)
MCH: 25.5 pg — ABNORMAL LOW (ref 26.0–34.0)
MCHC: 31.4 g/dL (ref 30.0–36.0)
MCV: 81.1 fL (ref 80.0–100.0)
Monocytes Absolute: 1.4 10*3/uL — ABNORMAL HIGH (ref 0.1–1.0)
Monocytes Relative: 10 %
Neutro Abs: 9.5 10*3/uL — ABNORMAL HIGH (ref 1.7–7.7)
Neutrophils Relative %: 67 %
Platelet Count: 446 10*3/uL — ABNORMAL HIGH (ref 150–400)
RBC: 5.45 MIL/uL (ref 4.22–5.81)
RDW: 17 % — ABNORMAL HIGH (ref 11.5–15.5)
WBC Count: 14.3 10*3/uL — ABNORMAL HIGH (ref 4.0–10.5)
nRBC: 0 % (ref 0.0–0.2)

## 2020-07-10 LAB — CMP (CANCER CENTER ONLY)
ALT: 43 U/L (ref 0–44)
AST: 22 U/L (ref 15–41)
Albumin: 3.9 g/dL (ref 3.5–5.0)
Alkaline Phosphatase: 78 U/L (ref 38–126)
Anion gap: 9 (ref 5–15)
BUN: 21 mg/dL (ref 8–23)
CO2: 29 mmol/L (ref 22–32)
Calcium: 9.1 mg/dL (ref 8.9–10.3)
Chloride: 102 mmol/L (ref 98–111)
Creatinine: 1.38 mg/dL — ABNORMAL HIGH (ref 0.61–1.24)
GFR, Estimated: 53 mL/min — ABNORMAL LOW
Glucose, Bld: 106 mg/dL — ABNORMAL HIGH (ref 70–99)
Potassium: 4.4 mmol/L (ref 3.5–5.1)
Sodium: 140 mmol/L (ref 135–145)
Total Bilirubin: 0.6 mg/dL (ref 0.3–1.2)
Total Protein: 7.5 g/dL (ref 6.5–8.1)

## 2020-07-10 LAB — IMMATURE PLATELET FRACTION: Immature Platelet Fraction: 1.7 % (ref 1.2–8.6)

## 2020-07-10 NOTE — Progress Notes (Signed)
[place] [phone]   [phone]  PROGRESS NOTE  Patient Care Team: Crist Infante, MD as PCP - General (Internal Medicine)  Hematological/Oncological History 1) 06/20/2020: CBC w/diff and CMP: all values are WNL except for Plt of 39K, MCH of 25.8, MCHC of 30.5, RDW of 18.1. 2) 06/21/2020: Established care with Dr. Kaspar Albornoz Limbo. Labs revealed thrombocytopenia with platelet count of 51K. Initiated treatment for ITP with prednisone taper.    CHIEF COMPLAINTS/PURPOSE OF CONSULTATION:  "Thrombocytopenia"  HISTORY OF PRESENTING ILLNESS:  Douglas [name] 80 y.o. male with medical history significant for osteoporosis, sleep apnea, hypertension, hyperlipidemia, GERD, CKD and Hashimoto's Thyroiditis. Patient is unaccompanied for this visit. Patient initially presented to Dr. Grier Mitts clinic for evaluation for thrombocytopenia with a platelet count of 39K that was measured on 06/20/2020. Patient reports working in the yard the Saturday before 06/20/2020 and some sort of bite led to swelling and discoloration of his left hand. Dr. Aniyiah Zell Limbo felt thrombocytopenia was immune mediated and initiated treatment with prednisone.   On exam today, Douglas [name] reports that he is feeling well without any significant limitations.  His energy and appetite are stable.  He denies any nausea, vomiting or abdominal pain.  He has regular bowel movements.  Patient endorses mild insomnia with prednisone.  He denies any fevers, chills, shortness of breath, chest pain or cough.  He denies any easy bruising or signs of bleeding including hematochezia, melena, hematuria, epistaxis, hemoptysis or gum bleeding.  He has no other complaints.  Rest of the 10 point ROS is below.  MEDICAL HISTORY:  Past Medical History:  Diagnosis Date  . Allergy    seasonal  . Anemia   . Bilateral inguinal hernia   . Chronic kidney disease    h/o- renal calculi- passed spont. , 10 episodes over the course of 20 yrs.   Marland Kitchen  GERD (gastroesophageal reflux disease)   . Hyperlipidemia   . Hypertension   . Hypothyroidism   . Osteoporosis   . Sleep apnea    CPAP q night - study done 8-10 yrs. ago   . Testosterone deficiency    injections q 3 week- July 8- last injection   . Thyroid disease     SURGICAL HISTORY: Past Surgical History:  Procedure Laterality Date  . ANAL FISSURE REPAIR    . CHOLECYSTECTOMY    . COLONOSCOPY    . FIBULA FRACTURE SURGERY     fibula and tibia fracture   . FRACTURE SURGERY     L leg -plate & screws - ORIF- fibula  . HEMORRHOID SURGERY    . HERNIA REPAIR  04/19/11   bilateral  . LAPAROSCOPIC INGUINAL HERNIA REPAIR  04/19/11   bilateral  . LAPAROSCOPY  10/11/2011   Procedure: LAPAROSCOPY DIAGNOSTIC;  Surgeon: Adin Hector, MD;  Location: [place];  Service: General;  Laterality: N/A;  UMBILICAL EXPLORATION  . MASS EXCISION  10/11/2011   Procedure: EXCISION MASS;  Surgeon: Adin Hector, MD;  Location: [place];  Service: General;  Laterality: N/A;  EXCISION OF UMBILICAL MASS  . POLYPECTOMY      SOCIAL HISTORY: Social History   Socioeconomic History  . Marital status: Married    Spouse name: Not on file  . Number of children: Not on file  . Years of education: Not on file  . Highest education level: Not on file  Occupational History  . Not on file  Tobacco Use  . Smoking status: Former Audiological scientist  date: 06/05/1998    Years since quitting: 22.1  . Smokeless tobacco: Never Used  Vaping Use  . Vaping Use: Never used  Substance and Sexual Activity  . Alcohol use: No  . Drug use: No  . Sexual activity: Not on file  Other Topics Concern  . Not on file  Social History Narrative  . Not on file   Social Determinants of Health   Financial Resource Strain: Not on file  Food Insecurity: Not on file  Transportation Needs: Not on file  Physical Activity: Not on file  Stress: Not on file  Social Connections: Not on file  Intimate Partner Violence: Not on file     FAMILY HISTORY: Family History  Problem Relation Age of Onset  . Stroke Mother   . Stroke Father   . Colon cancer Neg Hx   . Rectal cancer Neg Hx   . Stomach cancer Neg Hx   . Esophageal cancer Neg Hx   . Colon polyps Neg Hx   . Allergic rhinitis Neg Hx   . Asthma Neg Hx   . Angioedema Neg Hx   . Atopy Neg Hx   . Eczema Neg Hx   . Immunodeficiency Neg Hx   . Urticaria Neg Hx     ALLERGIES:  is allergic to alpha-gal.  MEDICATIONS:  Current Outpatient Medications  Medication Sig Dispense Refill  . Calcium Carbonate-Vitamin D (CALCIUM-VITAMIN D) 600-125 MG-UNIT TABS Take by mouth daily.    . cetirizine (ZYRTEC) 5 MG tablet Take 10 mg by mouth daily.     . cholecalciferol (VITAMIN D) 1000 UNITS tablet Take 1,000 Units by mouth 2 (two) times daily.    Marland Kitchen denosumab (PROLIA) 60 MG/ML SOSY injection     . EPINEPHrine (EPIPEN 2-PAK) 0.3 mg/0.3 mL IJ SOAJ injection Inject 0.3 mg into the muscle as needed for anaphylaxis.    . famotidine (PEPCID) 20 MG tablet Take 1 tablet (20 mg total) by mouth 2 (two) times daily. 180 tablet 1  . fish oil-omega-3 fatty acids 1000 MG capsule Take 1 g by mouth 4 (four) times daily.     Marland Kitchen levothyroxine (SYNTHROID) 125 MCG tablet Take 125 mcg by mouth every morning.    Marland Kitchen levothyroxine (SYNTHROID, LEVOTHROID) 112 MCG tablet Take 112 mcg by mouth daily.    . metroNIDAZOLE (METROGEL) 0.75 % gel Apply 1 application topically 2 (two) times daily.    . Multiple Vitamin (MULTIVITAMIN PO) Take by mouth daily.      Marland Kitchen OVER THE COUNTER MEDICATION Magnesium 500mg  po daily    . pantoprazole (PROTONIX) 40 MG tablet Take 40 mg by mouth daily.    . predniSONE (DELTASONE) 20 MG tablet Take 3 tablets (60 mg total) by mouth daily with breakfast for 7 days, THEN 2 tablets (40 mg total) daily with breakfast for 7 days, THEN 1.5 tablets (30 mg total) daily with breakfast for 7 days, THEN 1 tablet (20 mg total) daily with breakfast. 75.5 tablet 0  . simvastatin (ZOCOR) 40 MG  tablet Take 40 mg by mouth every evening.    Marland Kitchen telmisartan (MICARDIS) 80 MG tablet Take 80 mg by mouth daily.    Marland Kitchen testosterone cypionate (DEPOTESTOTERONE CYPIONATE) 100 MG/ML injection Inject into the muscle every 21 ( twenty-one) days. For IM use only     No current facility-administered medications for this visit.    REVIEW OF SYSTEMS:   Constitutional: ( - ) fevers, ( - )  chills , ( - ) night sweats  Eyes: ( - ) blurriness of vision, ( - ) double vision, ( - ) watery eyes Ears, nose, mouth, throat, and face: ( - ) mucositis, ( - ) sore throat Respiratory: ( - ) cough, ( - ) dyspnea, ( - ) wheezes Cardiovascular: ( - ) palpitation, ( - ) chest discomfort, ( - ) lower extremity swelling Gastrointestinal:  ( - ) nausea, ( - ) heartburn, ( - ) change in bowel habits Skin: ( - ) abnormal skin rashes Lymphatics: ( - ) new lymphadenopathy, ( - ) easy bruising Neurological: ( - ) numbness, ( - ) tingling, ( - ) new weaknesses Behavioral/Psych: ( - ) mood change, ( - ) new changes  All other systems were reviewed with the patient and are negative.  PHYSICAL EXAMINATION: ECOG PERFORMANCE STATUS: 0 - Asymptomatic  Vitals:   07/10/20 1016  BP: (!) 152/70  Pulse: 63  Resp: 18  Temp: (!) 94.6 F (34.8 C)  SpO2: 100%   Filed Weights   07/10/20 1016  Weight: 186 lb 12.8 oz (84.7 kg)    GENERAL: well appearing male in NAD  SKIN: skin color, texture, turgor are normal, no rashes or significant lesions EYES: conjunctiva are pink and non-injected, sclera clear OROPHARYNX: no exudate, no erythema; lips, buccal mucosa, and tongue normal  NECK: supple, non-tender LYMPH:  no palpable lymphadenopathy in the cervical, axillary or supraclavicular lymph nodes.  LUNGS: clear to auscultation and percussion with normal breathing effort HEART: regular rate & rhythm and no murmurs and no lower extremity edema ABDOMEN: soft, non-tender, non-distended, normal bowel sounds Musculoskeletal: no cyanosis  of digits and no clubbing  PSYCH: alert & oriented x 3, fluent speech NEURO: no focal motor/sensory deficits  LABORATORY DATA:  I have reviewed the data as listed CBC Latest Ref Rng & Units 07/10/2020 06/30/2020 06/21/2020  WBC 4.0 - 10.5 K/uL 14.3(H) 8.8 7.8  Hemoglobin 13.0 - 17.0 g/dL 13.9 13.6 14.5  Hematocrit 39.0 - 52.0 % 44.2 43.9 46.8  Platelets 150 - 400 K/uL 446(H) 113(L) 51(L)    CMP Latest Ref Rng & Units 07/10/2020 06/30/2020 06/21/2020  Glucose 70 - 99 mg/dL 106(H) 130(H) 83  BUN 8 - 23 mg/dL 21 14 18   Creatinine 0.61 - 1.24 mg/dL 1.38(H) 1.32(H) 1.45(H)  Sodium 135 - 145 mmol/L 140 139 138  Potassium 3.5 - 5.1 mmol/L 4.4 4.8 4.4  Chloride 98 - 111 mmol/L 102 104 103  CO2 22 - 32 mmol/L 29 25 26   Calcium 8.9 - 10.3 mg/dL 9.1 9.0 9.4  Total Protein 6.5 - 8.1 g/dL 7.5 7.7 8.8(H)  Total Bilirubin 0.3 - 1.2 mg/dL 0.6 0.5 0.7  Alkaline Phos 38 - 126 U/L 78 87 90  AST 15 - 41 U/L 22 66(H) 33  ALT 0 - 44 U/L 43 39 36   ASSESSMENT & PLAN Douglas [name] is a 81 y.o. male presenting to the clinic for a follow up for thrombocytopenia.   #Isolated thrombocytopenia, likely ITP: --Initiated prednisone taper on 06/29/2020 with directions to take 3 tablets (60 mg total) by mouth daily with breakfast for 7 days, THEN 2 tablets (40 mg total) daily with breakfast for 7 days, THEN 1.5 tablets (30 mg total) daily with breakfast for 7 days, THEN 1 tablet (20 mg total) daily with breakfast. --Labs from today show Platelet count has rebounded, now 446K. WBC elevated at 14.3, likely secondary to steroids.  --Continue with steroid taper. No further intervention is recommendd.  --Next scheduled visit  with Dr. Eman Morimoto Limbo on 07/20/2020.   No orders of the defined types were placed in this encounter.   All questions were answered. The patient knows to call the clinic with any problems, questions or concerns.  A total of more than 30 minutes were spent on this encounter and over half of that time was spent  on counseling and coordination of care as outlined above.   Dede Query, PA-C Department of Hematology/Oncology [place] [facility] Phone: [phone]  Patient was seen with Dr. Lorenso Courier.   I have read the above note and personally examined the patient. I agree with the assessment and plan as noted above.  Briefly Douglas [name] is a 80 year old male with medical history significant for ITP who currently follows with Dr. Kerianne Gurr Limbo.  Patient is on a steroid taper with appropriate correction and his platelet count.  He now has a robust thrombocytosis.  Continue steroid taper as previously planned by Dr. Larissa Pegg Limbo and continue to monitor.   Ledell Peoples, MD Department of Hematology/Oncology [place] [facility] Phone: [phone] Pager: [phone] Email: [email or web address]

## 2020-07-11 ENCOUNTER — Telehealth: Payer: Self-pay | Admitting: Hematology and Oncology

## 2020-07-11 NOTE — Telephone Encounter (Signed)
Cancelled upcoming appointment per 4/18 los. Patient is aware.

## 2020-07-14 ENCOUNTER — Inpatient Hospital Stay: Payer: Medicare Other

## 2020-07-19 NOTE — Progress Notes (Signed)
HEMATOLOGY/ONCOLOGY CONSULTATION NOTE  Date of Service: 07/20/2020  Patient Care Team: Rodrigo RanPerini, Mark, MD as PCP - General (Internal Medicine)  CHIEF COMPLAINTS/PURPOSE OF CONSULTATION:  Thrombocytopenia  HISTORY OF PRESENTING ILLNESS:   Douglas [name] is a wonderful 80 y.o. male who has been referred to us by Dr. Waynard EdwardsPerini for evaluation and management of thrombocytopenia. The pt reports that she is doing well overall.  The pt reports that he was referred here for low Plt found during routine labs. He notes that his last labs six months prior to this were normal. The pt notes no bleeding issues. The pt notes he was working in the yard on Saturday and his left hand got swollen. It is now slowly getting better and turning blue in color. He notes no other symptoms alongside this and no pain. He notes he was not wearing gloves and was reaching into places that could have snakes or spiders. The pt notes he did not go the ED. This was prior to the pt's labs being drawn. The pt notes his wife puts a Benadryl on it and this helped to reduce the swelling. The pt notes that he taken Spiriva, but nothing has happened. The pt notes no other acute medication changes. The pt notes all other medical issues have been stable.  The pt notes he does not drink alcohol. The pt notes a possible Alpha Gal allergy, but denies any other food allergies. He eats lots of chicken and fresh fruits/vegetables. The pt denies any autoimmune conditions in his family and notes no change whatsoever in the last six months. The pt is unaware the cause of his hypothyroidism, but it is well controlled. He denies any concerns over liver abnormalities. The pt notes the Alpha Gal was diagnosed one year ago and he was experiencing SOB and difficulty breathing after eating red meat. He notes 2-3 episodes yearly for ten years and this was found in doing the panel for allergies. The pt notes no incidences since giving up beef and pork. The pt  had elevated IgE antibodies. The pt notes he tries to avoid NSAIDs and does not take any herbal supplements. The pt denies any need for transfusions or received any blood products.  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 The pt notes he was exposed to lead and acid, but not significantly. The pt notes that his daughter in law passed away of Leukemia two years ago.  Lab results 06/20/2020 of CBC w/diff and CMP is as follows: all values are WNL except for Plt of 39K, MCH of 25.8, MCHC of 30.5, RDW of 18.1. 12/13/2019 Plt of 289K.  On review of systems, pt reports recent animal bite on hand and denies new lumps/bumps, hand pain, bleeding issues, sudden weight loss, fevers, chills, recent vaccinations, antibiotic usage, sore throat, throat pain, runny nose, other acute skin rashes, unusual food intake, SOB, chest pain, changes in breathing, testicular pain/swelling, and any other symptoms.  INTERVAL HISTORY  Douglas [name] is a wonderful 80 y.o. male who is here today for evaluation and management of thrombocytopenia. The patient's last visit with us was on 06/21/2020. The pt reports that he is doing well overall.  The pt reports that he has been experiencing very painful cramping in his hands and feet. The pt notes he has been more active in his yard as well. The pt  currently notes he is at 20 mg Prednisone daily, noting no issues with sleeping on the lower dose of the taper. The pt notes he started on the 20 mg today.  Lab results today 07/20/2020 of CBC w/diff and CMP is as follows: all values are WNL except for WBC of 11.3K, MCH of 25.6, RDW of 18.6, Glucose of 101. 07/20/2020 Immature Plt Fract of 1.7.   On review of systems, pt reports cramping of hands and feet and denies bleeding issues, difficulty sleeping, and any other symptoms.  MEDICAL HISTORY:  Past  Medical History:  Diagnosis Date  . Allergy    seasonal  . Anemia   . Bilateral inguinal hernia   . Chronic kidney disease    h/o- renal calculi- passed spont. , 10 episodes over the course of 20 yrs.   Marland Kitchen GERD (gastroesophageal reflux disease)   . Hyperlipidemia   . Hypertension   . Hypothyroidism   . Osteoporosis   . Sleep apnea    CPAP q night - study done 8-10 yrs. ago   . Testosterone deficiency    injections q 3 week- July 8- last injection   . Thyroid disease     SURGICAL HISTORY: Past Surgical History:  Procedure Laterality Date  . ANAL FISSURE REPAIR    . CHOLECYSTECTOMY    . COLONOSCOPY    . FIBULA FRACTURE SURGERY     fibula and tibia fracture   . FRACTURE SURGERY     L leg -plate & screws - ORIF- fibula  . HEMORRHOID SURGERY    . HERNIA REPAIR  04/19/11   bilateral  . LAPAROSCOPIC INGUINAL HERNIA REPAIR  04/19/11   bilateral  . LAPAROSCOPY  10/11/2011   Procedure: LAPAROSCOPY DIAGNOSTIC;  Surgeon: Adin Hector, MD;  Location: [facility];  Service: General;  Laterality: N/A;  UMBILICAL EXPLORATION  . MASS EXCISION  10/11/2011   Procedure: EXCISION MASS;  Surgeon: Adin Hector, MD;  Location: [facility];  Service: General;  Laterality: N/A;  EXCISION OF UMBILICAL MASS  . POLYPECTOMY      SOCIAL HISTORY: Social History   Socioeconomic History  . Marital status: Married    Spouse name: Not on file  . Number of children: Not on file  . Years of education: Not on file  . Highest education level: Not on file  Occupational History  . Not on file  Tobacco Use  . Smoking status: Former Smoker    Quit date: 06/05/1998    Years since quitting: 22.1  . Smokeless tobacco: Never Used  Vaping Use  . Vaping Use: Never used  Substance and Sexual Activity  . Alcohol use: No  . Drug use: No  . Sexual activity: Not on file  Other Topics Concern  . Not on file  Social History Narrative  . Not on file   Social Determinants of Health   Financial Resource Strain: Not  on file  Food Insecurity: Not on file  Transportation Needs: Not on file  Physical Activity: Not on file  Stress: Not on file  Social Connections: Not on file  Intimate Partner Violence: Not on file    FAMILY HISTORY: Family History  Problem Relation Age of Onset  . Stroke Mother   . Stroke Father   . Colon cancer Neg Hx   . Rectal cancer Neg Hx   . Stomach cancer Neg Hx   . Esophageal cancer Neg Hx   . Colon polyps Neg Hx   . Allergic rhinitis  Neg Hx   . Asthma Neg Hx   . Angioedema Neg Hx   . Atopy Neg Hx   . Eczema Neg Hx   . Immunodeficiency Neg Hx   . Urticaria Neg Hx     ALLERGIES:  is allergic to alpha-gal.  MEDICATIONS:  Current Outpatient Medications  Medication Sig Dispense Refill  . predniSONE (DELTASONE) 5 MG tablet Take 4 tablets (20 mg total) by mouth daily with breakfast for 15 days, THEN 3 tablets (15 mg total) daily with breakfast for 7 days, THEN 2 tablets (10 mg total) daily with breakfast for 7 days, THEN 1 tablet (5 mg total) daily with breakfast for 10 days, THEN 1 tablet (5 mg total) every other day for 20 days. 115 tablet 0  . Calcium Carbonate-Vitamin D (CALCIUM-VITAMIN D) 600-125 MG-UNIT TABS Take by mouth daily.    . cetirizine (ZYRTEC) 5 MG tablet Take 10 mg by mouth daily.     . cholecalciferol (VITAMIN D) 1000 UNITS tablet Take 1,000 Units by mouth 2 (two) times daily.    Marland Kitchen denosumab (PROLIA) 60 MG/ML SOSY injection     . EPINEPHrine (EPIPEN 2-PAK) 0.3 mg/0.3 mL IJ SOAJ injection Inject 0.3 mg into the muscle as needed for anaphylaxis.    . famotidine (PEPCID) 20 MG tablet Take 1 tablet (20 mg total) by mouth 2 (two) times daily. 180 tablet 1  . fish oil-omega-3 fatty acids 1000 MG capsule Take 1 g by mouth 4 (four) times daily.     Marland Kitchen levothyroxine (SYNTHROID) 125 MCG tablet Take 125 mcg by mouth every morning.    Marland Kitchen levothyroxine (SYNTHROID, LEVOTHROID) 112 MCG tablet Take 112 mcg by mouth daily.    . metroNIDAZOLE (METROGEL) 0.75 % gel Apply 1  application topically 2 (two) times daily.    . Multiple Vitamin (MULTIVITAMIN PO) Take by mouth daily.      Marland Kitchen OVER THE COUNTER MEDICATION Magnesium 500mg  po daily    . pantoprazole (PROTONIX) 40 MG tablet Take 40 mg by mouth daily.    . simvastatin (ZOCOR) 40 MG tablet Take 40 mg by mouth every evening.    Marland Kitchen telmisartan (MICARDIS) 80 MG tablet Take 80 mg by mouth daily.    Marland Kitchen testosterone cypionate (DEPOTESTOTERONE CYPIONATE) 100 MG/ML injection Inject into the muscle every 21 ( twenty-one) days. For IM use only     No current facility-administered medications for this visit.    REVIEW OF SYSTEMS:   10 Point review of Systems was done is negative except as noted above.  PHYSICAL EXAMINATION: ECOG PERFORMANCE STATUS: 0 - Asymptomatic  Vitals:   07/20/20 0915  BP: 137/68  Pulse: (!) 53  Resp: 16  Temp: (!) 97.3 F (36.3 C)   Filed Weights   07/20/20 0915  Weight: 187 lb 6.4 oz (85 kg)   .Body mass index is 26.89 kg/m.   GENERAL:alert, in no acute distress and comfortable SKIN: no acute rashes, no significant lesions EYES: conjunctiva are pink and non-injected, sclera anicteric OROPHARYNX: MMM, no exudates, no oropharyngeal erythema or ulceration NECK: supple, no JVD LYMPH:  no palpable lymphadenopathy in the cervical, axillary or inguinal regions LUNGS: clear to auscultation b/l with normal respiratory effort HEART: regular rate & rhythm ABDOMEN:  normoactive bowel sounds , non tender, not distended. Extremity: no pedal edema PSYCH: alert & oriented x 3 with fluent speech NEURO: no focal motor/sensory deficits  LABORATORY DATA:  I have reviewed the data as listed  CBC Latest Ref Rng & Units  07/20/2020 07/10/2020 06/30/2020  WBC 4.0 - 10.5 K/uL 11.3(H) 14.3(H) 8.8  Hemoglobin 13.0 - 17.0 g/dL 13.4 13.9 13.6  Hematocrit 39.0 - 52.0 % 43.1 44.2 43.9  Platelets 150 - 400 K/uL 237 446(H) 113(L)    CMP Latest Ref Rng & Units 07/20/2020 07/10/2020 06/30/2020  Glucose 70 - 99  mg/dL 101(H) 106(H) 130(H)  BUN 8 - 23 mg/dL 18 21 14   Creatinine 0.61 - 1.24 mg/dL 1.22 1.38(H) 1.32(H)  Sodium 135 - 145 mmol/L 140 140 139  Potassium 3.5 - 5.1 mmol/L 4.4 4.4 4.8  Chloride 98 - 111 mmol/L 102 102 104  CO2 22 - 32 mmol/L 29 29 25   Calcium 8.9 - 10.3 mg/dL 9.1 9.1 9.0  Total Protein 6.5 - 8.1 g/dL 6.8 7.5 7.7  Total Bilirubin 0.3 - 1.2 mg/dL 0.9 0.6 0.5  Alkaline Phos 38 - 126 U/L 72 78 87  AST 15 - 41 U/L 20 22 66(H)  ALT 0 - 44 U/L 36 43 39   Component     Latest Ref Rng & Units 06/21/2020  Folate, Hemolysate     Not Estab. ng/mL >620.0  HCT     37.5 - 51.0 % 46.8  Folate, RBC     >498 ng/mL >1,325  Prothrombin Time     11.4 - 15.2 seconds 13.0  INR     0.8 - 1.2 1.0  LDH     98 - 192 U/L 254 (H)  Immature Platelet Fraction     1.2 - 8.6 % 9.7 (H)  ABO/RH(D)      O POS . . .  HCV Ab     NON REACTIVE NON REACTIVE  Platelet CT in Citrate      EDTA platelet count consistent with citrate.  Fibrinogen     210 - 475 mg/dL 354  APTT     24 - 36 seconds 28  Haptoglobin     34 - 355 mg/dL 141  Vitamin B12     180 - 914 pg/mL 458    RADIOGRAPHIC STUDIES: I have personally reviewed the radiological images as listed and agreed with the findings in the report. No results found.  ASSESSMENT & PLAN:   80 y.o. with   1) Isolated thrombocytopenia Likely ITP  PLAN: -Discussed pt labwork today, 07/20/2020; Plt have normalized, blood counts improved, chemistries normal. -Advised pt that due to no pancytopenia and normalized Plt counts with steroids, this is indicative of ITP. -Advised pt that if his Plt lasts more than three months without relapse after finishing the steroid taper, then this would be acute ITP. If not, then it would be indicative of a medication causing Plt issues or another factor and be chronic ITP. -Recommended pt start taking a Vitamin B-Complex daily.  -Continue Multivitamin daily. -Recommended pt discuss with PCP regarding weaning  off of Protonix gradually if possible. -Advised pt to continue the 20 mg for two weeks. Then taper by 5 mg decrease each week. Thus, in 3 weeks it will be 15 mg daily. In four weeks, 10 mg. In five weeks, 5 mg daily. Will stay on 5 mg for a month before ending the Prednisone taper. -Will get labs in 2 weeks. -Will see back in 4 weeks with labs.   FOLLOW UP: Labs in 2 weeks RTC with Dr Irene Limbo with labs in 4 weeks   All of the patients questions were answered with apparent satisfaction. The patient knows to call the clinic with any problems, questions or  concerns.  The total time spent in the appointment was 20 minutes and more than 50% was on counseling and direct patient cares.     Sullivan Lone MD [place] AAHIVMS [facility] Hematology/Oncology Physician [facility]  (Office):       [phone] (Work cell):  [phone] (Fax):           [phone]  07/20/2020 10:12 AM  I, Reinaldo Raddle, am acting as scribe for Dr. Sullivan Lone, MD.   .I have reviewed the above documentation for accuracy and completeness, and I agree with the above. Brunetta Genera MD

## 2020-07-20 ENCOUNTER — Telehealth: Payer: Self-pay | Admitting: Hematology

## 2020-07-20 ENCOUNTER — Inpatient Hospital Stay (HOSPITAL_BASED_OUTPATIENT_CLINIC_OR_DEPARTMENT_OTHER): Payer: Medicare Other | Admitting: Hematology

## 2020-07-20 ENCOUNTER — Other Ambulatory Visit: Payer: Self-pay

## 2020-07-20 ENCOUNTER — Inpatient Hospital Stay: Payer: Medicare Other

## 2020-07-20 VITALS — BP 137/68 | HR 53 | Temp 97.3°F | Resp 16 | Ht 70.0 in | Wt 187.4 lb

## 2020-07-20 DIAGNOSIS — M810 Age-related osteoporosis without current pathological fracture: Secondary | ICD-10-CM | POA: Diagnosis not present

## 2020-07-20 DIAGNOSIS — K219 Gastro-esophageal reflux disease without esophagitis: Secondary | ICD-10-CM | POA: Diagnosis not present

## 2020-07-20 DIAGNOSIS — D693 Immune thrombocytopenic purpura: Secondary | ICD-10-CM

## 2020-07-20 DIAGNOSIS — E785 Hyperlipidemia, unspecified: Secondary | ICD-10-CM | POA: Diagnosis not present

## 2020-07-20 DIAGNOSIS — I10 Essential (primary) hypertension: Secondary | ICD-10-CM | POA: Diagnosis not present

## 2020-07-20 DIAGNOSIS — D696 Thrombocytopenia, unspecified: Secondary | ICD-10-CM | POA: Diagnosis not present

## 2020-07-20 DIAGNOSIS — Z7952 Long term (current) use of systemic steroids: Secondary | ICD-10-CM | POA: Diagnosis not present

## 2020-07-20 LAB — CBC WITH DIFFERENTIAL (CANCER CENTER ONLY)
Abs Immature Granulocytes: 0.07 10*3/uL (ref 0.00–0.07)
Basophils Absolute: 0 10*3/uL (ref 0.0–0.1)
Basophils Relative: 0 %
Eosinophils Absolute: 0.1 10*3/uL (ref 0.0–0.5)
Eosinophils Relative: 1 %
HCT: 43.1 % (ref 39.0–52.0)
Hemoglobin: 13.4 g/dL (ref 13.0–17.0)
Immature Granulocytes: 1 %
Lymphocytes Relative: 25 %
Lymphs Abs: 2.8 10*3/uL (ref 0.7–4.0)
MCH: 25.6 pg — ABNORMAL LOW (ref 26.0–34.0)
MCHC: 31.1 g/dL (ref 30.0–36.0)
MCV: 82.3 fL (ref 80.0–100.0)
Monocytes Absolute: 0.9 10*3/uL (ref 0.1–1.0)
Monocytes Relative: 8 %
Neutro Abs: 7.4 10*3/uL (ref 1.7–7.7)
Neutrophils Relative %: 65 %
Platelet Count: 237 10*3/uL (ref 150–400)
RBC: 5.24 MIL/uL (ref 4.22–5.81)
RDW: 18.6 % — ABNORMAL HIGH (ref 11.5–15.5)
WBC Count: 11.3 10*3/uL — ABNORMAL HIGH (ref 4.0–10.5)
nRBC: 0 % (ref 0.0–0.2)

## 2020-07-20 LAB — CMP (CANCER CENTER ONLY)
ALT: 36 U/L (ref 0–44)
AST: 20 U/L (ref 15–41)
Albumin: 3.6 g/dL (ref 3.5–5.0)
Alkaline Phosphatase: 72 U/L (ref 38–126)
Anion gap: 9 (ref 5–15)
BUN: 18 mg/dL (ref 8–23)
CO2: 29 mmol/L (ref 22–32)
Calcium: 9.1 mg/dL (ref 8.9–10.3)
Chloride: 102 mmol/L (ref 98–111)
Creatinine: 1.22 mg/dL (ref 0.61–1.24)
GFR, Estimated: >60 mL/min (ref >60)
Glucose, Bld: 101 mg/dL — ABNORMAL HIGH (ref 70–99)
Potassium: 4.4 mmol/L (ref 3.5–5.1)
Sodium: 140 mmol/L (ref 135–145)
Total Bilirubin: 0.9 mg/dL (ref 0.3–1.2)
Total Protein: 6.8 g/dL (ref 6.5–8.1)

## 2020-07-20 LAB — IMMATURE PLATELET FRACTION: Immature Platelet Fraction: 1.7 % (ref 1.2–8.6)

## 2020-07-20 MED ORDER — PREDNISONE 5 MG PO TABS: ORAL_TABLET | ORAL | 0 refills | Status: DC

## 2020-07-20 NOTE — Telephone Encounter (Signed)
Scheduled per los. Gave avs and calendar  

## 2020-07-26 ENCOUNTER — Telehealth: Payer: Self-pay | Admitting: Hematology

## 2020-07-26 NOTE — Telephone Encounter (Signed)
Pt called in to r/s appt on 5/12. Pt is aware of updated appt date and time.

## 2020-07-27 ENCOUNTER — Encounter (HOSPITAL_COMMUNITY): Payer: Medicare Other

## 2020-07-28 ENCOUNTER — Other Ambulatory Visit (HOSPITAL_COMMUNITY): Payer: Self-pay | Admitting: *Deleted

## 2020-07-31 ENCOUNTER — Encounter (HOSPITAL_COMMUNITY)
Admission: RE | Admit: 2020-07-31 | Discharge: 2020-07-31 | Disposition: A | Discharge status: Home / Self Care | Payer: Medicare Other | Source: Ambulatory Visit | Attending: Internal Medicine | Admitting: Internal Medicine

## 2020-07-31 DIAGNOSIS — M810 Age-related osteoporosis without current pathological fracture: Secondary | ICD-10-CM | POA: Insufficient documentation

## 2020-07-31 MED ORDER — DENOSUMAB 60 MG/ML ~~LOC~~ SOSY
PREFILLED_SYRINGE | SUBCUTANEOUS | Status: AC
  Filled 2020-07-31: qty 1

## 2020-07-31 MED ORDER — DENOSUMAB 60 MG/ML ~~LOC~~ SOSY
60.0000 mg | PREFILLED_SYRINGE | Freq: Once | SUBCUTANEOUS | Status: AC
  Administered 2020-07-31: 60 mg via SUBCUTANEOUS

## 2020-08-02 ENCOUNTER — Inpatient Hospital Stay: Payer: Medicare Other | Attending: Hematology

## 2020-08-02 ENCOUNTER — Other Ambulatory Visit: Payer: Self-pay

## 2020-08-02 DIAGNOSIS — Z79899 Other long term (current) drug therapy: Secondary | ICD-10-CM | POA: Diagnosis not present

## 2020-08-02 DIAGNOSIS — D696 Thrombocytopenia, unspecified: Secondary | ICD-10-CM | POA: Diagnosis not present

## 2020-08-02 DIAGNOSIS — K219 Gastro-esophageal reflux disease without esophagitis: Secondary | ICD-10-CM | POA: Insufficient documentation

## 2020-08-02 DIAGNOSIS — E785 Hyperlipidemia, unspecified: Secondary | ICD-10-CM | POA: Diagnosis not present

## 2020-08-02 DIAGNOSIS — Z7952 Long term (current) use of systemic steroids: Secondary | ICD-10-CM | POA: Diagnosis not present

## 2020-08-02 DIAGNOSIS — R768 Other specified abnormal immunological findings in serum: Secondary | ICD-10-CM | POA: Diagnosis not present

## 2020-08-02 DIAGNOSIS — I129 Hypertensive chronic kidney disease with stage 1 through stage 4 chronic kidney disease, or unspecified chronic kidney disease: Secondary | ICD-10-CM | POA: Diagnosis not present

## 2020-08-02 DIAGNOSIS — N189 Chronic kidney disease, unspecified: Secondary | ICD-10-CM | POA: Diagnosis not present

## 2020-08-02 DIAGNOSIS — E039 Hypothyroidism, unspecified: Secondary | ICD-10-CM | POA: Insufficient documentation

## 2020-08-02 DIAGNOSIS — Z87891 Personal history of nicotine dependence: Secondary | ICD-10-CM | POA: Diagnosis not present

## 2020-08-02 LAB — CBC WITH DIFFERENTIAL (CANCER CENTER ONLY)
Abs Immature Granulocytes: 0.21 10*3/uL — ABNORMAL HIGH (ref 0.00–0.07)
Basophils Absolute: 0.1 10*3/uL (ref 0.0–0.1)
Basophils Relative: 1 %
Eosinophils Absolute: 0.1 10*3/uL (ref 0.0–0.5)
Eosinophils Relative: 1 %
HCT: 45 % (ref 39.0–52.0)
Hemoglobin: 14 g/dL (ref 13.0–17.0)
Immature Granulocytes: 2 %
Lymphocytes Relative: 25 %
Lymphs Abs: 2.5 10*3/uL (ref 0.7–4.0)
MCH: 25.8 pg — ABNORMAL LOW (ref 26.0–34.0)
MCHC: 31.1 g/dL (ref 30.0–36.0)
MCV: 82.9 fL (ref 80.0–100.0)
Monocytes Absolute: 0.8 10*3/uL (ref 0.1–1.0)
Monocytes Relative: 8 %
Neutro Abs: 6.5 10*3/uL (ref 1.7–7.7)
Neutrophils Relative %: 63 %
Platelet Count: 295 10*3/uL (ref 150–400)
RBC: 5.43 MIL/uL (ref 4.22–5.81)
RDW: 19.4 % — ABNORMAL HIGH (ref 11.5–15.5)
WBC Count: 10.1 10*3/uL (ref 4.0–10.5)
nRBC: 0 % (ref 0.0–0.2)

## 2020-08-02 LAB — CMP (CANCER CENTER ONLY)
ALT: 38 U/L (ref 0–44)
AST: 17 U/L (ref 15–41)
Albumin: 3.5 g/dL (ref 3.5–5.0)
Alkaline Phosphatase: 85 U/L (ref 38–126)
Anion gap: 10 (ref 5–15)
BUN: 18 mg/dL (ref 8–23)
CO2: 29 mmol/L (ref 22–32)
Calcium: 9.4 mg/dL (ref 8.9–10.3)
Chloride: 101 mmol/L (ref 98–111)
Creatinine: 1.36 mg/dL — ABNORMAL HIGH (ref 0.61–1.24)
GFR, Estimated: 54 mL/min — ABNORMAL LOW (ref >60)
Glucose, Bld: 157 mg/dL — ABNORMAL HIGH (ref 70–99)
Potassium: 3.8 mmol/L (ref 3.5–5.1)
Sodium: 140 mmol/L (ref 135–145)
Total Bilirubin: 0.5 mg/dL (ref 0.3–1.2)
Total Protein: 7 g/dL (ref 6.5–8.1)

## 2020-08-02 LAB — IMMATURE PLATELET FRACTION: Immature Platelet Fraction: 1.4 % (ref 1.2–8.6)

## 2020-08-03 ENCOUNTER — Inpatient Hospital Stay: Payer: Medicare Other

## 2020-08-16 NOTE — Progress Notes (Signed)
HEMATOLOGY/ONCOLOGY CLINIC NOTE  Date of Service: 08/17/2020  Patient Care Team: Crist Infante, MD as PCP - General (Internal Medicine)  CHIEF COMPLAINTS/PURPOSE OF CONSULTATION:  Thrombocytopenia  HISTORY OF PRESENTING ILLNESS:   Douglas [name] is a wonderful 80 y.o. male who has been referred to [place] by Dr. Joylene Draft for evaluation and management of thrombocytopenia. The pt reports that she is doing well overall.  The pt reports that he was referred here for low Plt found during routine labs. He notes that his last labs six months prior to this were normal. The pt notes no bleeding issues. The pt notes he was working in the yard on Saturday and his left hand got swollen. It is now slowly getting better and turning blue in color. He notes no other symptoms alongside this and no pain. He notes he was not wearing gloves and was reaching into places that could have snakes or spiders. The pt notes he did not go the ED. This was prior to the pt's labs being drawn. The pt notes his wife puts a Benadryl on it and this helped to reduce the swelling. The pt notes that he taken Spiriva, but nothing has happened. The pt notes no other acute medication changes. The pt notes all other medical issues have been stable.  The pt notes he does not drink alcohol. The pt notes a possible Alpha Gal allergy, but denies any other food allergies. He eats lots of chicken and fresh fruits/vegetables. The pt denies any autoimmune conditions in his family and notes no change whatsoever in the last six months. The pt is unaware the cause of his hypothyroidism, but it is well controlled. He denies any concerns over liver abnormalities. The pt notes the Alpha Gal was diagnosed one year ago and he was experiencing SOB and difficulty breathing after eating red meat. He notes 2-3 episodes yearly for ten years and this was found in doing the panel for allergies. The pt notes no incidences since giving up beef and pork. The pt had  elevated IgE antibodies. The pt notes he tries to avoid NSAIDs and does not take any herbal supplements. The pt denies any need for transfusions or received any blood products.  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 The pt notes he was exposed to lead and acid, but not significantly. The pt notes that his daughter in law passed away of Leukemia two years ago.  Lab results 06/20/2020 of CBC w/diff and CMP is as follows: all values are WNL except for Plt of 39K, MCH of 25.8, MCHC of 30.5, RDW of 18.1. 12/13/2019 Plt of 289K.  On review of systems, pt reports recent animal bite on hand and denies new lumps/bumps, hand pain, bleeding issues, sudden weight loss, fevers, chills, recent vaccinations, antibiotic usage, sore throat, throat pain, runny nose, other acute skin rashes, unusual food intake, SOB, chest pain, changes in breathing, testicular pain/swelling, and any other symptoms.  INTERVAL HISTORY  Douglas [name] is a wonderful 80 y.o. male who is here today for evaluation and management of thrombocytopenia. The patient's last visit with [place] was on 07/20/2020. The pt reports that he is doing well overall.  The pt reports no new issues or concerns. He is currently on 10 mg Prednisone today and starts the 5 mg Prednisone tomorrow. He has been eating well and his sleep  has been improving. He denies any medication changes or any OTC supplements. The pt notes he has a physical upcoming with his PCP.  Lab results today 08/17/2020 of CBC w/diff and CMP is as follows: all values are WNL except for RDW of 19.3, Abs Immature Granulocytes of 0.12K, Glucose of 128, Total protein of 6.2, Albumin of 3.4. 08/17/2020 Immature Plt Fract of 1.5.  On review of systems, pt denies bleeding issues, decreased appetite, fevers, chills, abdominal pain, diarrhea, and any other  symptoms.  MEDICAL HISTORY:  Past Medical History:  Diagnosis Date  . Allergy    seasonal  . Anemia   . Bilateral inguinal hernia   . Chronic kidney disease    h/o- renal calculi- passed spont. , 10 episodes over the course of 20 yrs.   Marland Kitchen GERD (gastroesophageal reflux disease)   . Hyperlipidemia   . Hypertension   . Hypothyroidism   . Osteoporosis   . Sleep apnea    CPAP q night - study done 8-10 yrs. ago   . Testosterone deficiency    injections q 3 week- July 8- last injection   . Thyroid disease     SURGICAL HISTORY: Past Surgical History:  Procedure Laterality Date  . ANAL FISSURE REPAIR    . CHOLECYSTECTOMY    . COLONOSCOPY    . FIBULA FRACTURE SURGERY     fibula and tibia fracture   . FRACTURE SURGERY     L leg -plate & screws - ORIF- fibula  . HEMORRHOID SURGERY    . HERNIA REPAIR  04/19/11   bilateral  . LAPAROSCOPIC INGUINAL HERNIA REPAIR  04/19/11   bilateral  . LAPAROSCOPY  10/11/2011   Procedure: LAPAROSCOPY DIAGNOSTIC;  Surgeon: Adin Hector, MD;  Location: [place];  Service: General;  Laterality: N/A;  UMBILICAL EXPLORATION  . MASS EXCISION  10/11/2011   Procedure: EXCISION MASS;  Surgeon: Adin Hector, MD;  Location: [place];  Service: General;  Laterality: N/A;  EXCISION OF UMBILICAL MASS  . POLYPECTOMY      SOCIAL HISTORY: Social History   Socioeconomic History  . Marital status: Married    Spouse name: Not on file  . Number of children: Not on file  . Years of education: Not on file  . Highest education level: Not on file  Occupational History  . Not on file  Tobacco Use  . Smoking status: Former Smoker    Quit date: 06/05/1998    Years since quitting: 22.2  . Smokeless tobacco: Never Used  Vaping Use  . Vaping Use: Never used  Substance and Sexual Activity  . Alcohol use: No  . Drug use: No  . Sexual activity: Not on file  Other Topics Concern  . Not on file  Social History Narrative  . Not on file   Social Determinants of Health    Financial Resource Strain: Not on file  Food Insecurity: Not on file  Transportation Needs: Not on file  Physical Activity: Not on file  Stress: Not on file  Social Connections: Not on file  Intimate Partner Violence: Not on file    FAMILY HISTORY: Family History  Problem Relation Age of Onset  . Stroke Mother   . Stroke Father   . Colon cancer Neg Hx   . Rectal cancer Neg Hx   . Stomach cancer Neg Hx   . Esophageal cancer Neg Hx   . Colon polyps Neg Hx   . Allergic rhinitis Neg Hx   .  Asthma Neg Hx   . Angioedema Neg Hx   . Atopy Neg Hx   . Eczema Neg Hx   . Immunodeficiency Neg Hx   . Urticaria Neg Hx     ALLERGIES:  is allergic to alpha-gal.  MEDICATIONS:  Current Outpatient Medications  Medication Sig Dispense Refill  . predniSONE (DELTASONE) 5 MG tablet Take 1 tablet (5 mg total) by mouth daily with breakfast. 60 tablet 0  . Calcium Carbonate-Vitamin D (CALCIUM-VITAMIN D) 600-125 MG-UNIT TABS Take by mouth daily.    . cetirizine (ZYRTEC) 5 MG tablet Take 10 mg by mouth daily.     . cholecalciferol (VITAMIN D) 1000 UNITS tablet Take 1,000 Units by mouth 2 (two) times daily.    Marland Kitchen denosumab (PROLIA) 60 MG/ML SOSY injection     . EPINEPHrine (EPIPEN 2-PAK) 0.3 mg/0.3 mL IJ SOAJ injection Inject 0.3 mg into the muscle as needed for anaphylaxis.    . famotidine (PEPCID) 20 MG tablet Take 1 tablet (20 mg total) by mouth 2 (two) times daily. 180 tablet 1  . fish oil-omega-3 fatty acids 1000 MG capsule Take 1 g by mouth 4 (four) times daily.     Marland Kitchen levothyroxine (SYNTHROID) 125 MCG tablet Take 125 mcg by mouth every morning.    Marland Kitchen levothyroxine (SYNTHROID, LEVOTHROID) 112 MCG tablet Take 112 mcg by mouth daily.    . metroNIDAZOLE (METROGEL) 0.75 % gel Apply 1 application topically 2 (two) times daily.    . Multiple Vitamin (MULTIVITAMIN PO) Take by mouth daily.      Marland Kitchen OVER THE COUNTER MEDICATION Magnesium 500mg  po daily    . pantoprazole (PROTONIX) 40 MG tablet Take 40 mg  by mouth daily.    . simvastatin (ZOCOR) 40 MG tablet Take 40 mg by mouth every evening.    Marland Kitchen telmisartan (MICARDIS) 80 MG tablet Take 80 mg by mouth daily.    Marland Kitchen testosterone cypionate (DEPOTESTOTERONE CYPIONATE) 100 MG/ML injection Inject into the muscle every 21 ( twenty-one) days. For IM use only     No current facility-administered medications for this visit.    REVIEW OF SYSTEMS:   10 Point review of Systems was done is negative except as noted above.  PHYSICAL EXAMINATION: ECOG PERFORMANCE STATUS: 0 - Asymptomatic  Vitals:   08/17/20 0946  BP: 124/69  Pulse: 62  Resp: 17  Temp: 97.6 F (36.4 C)  SpO2: 98%   Filed Weights   08/17/20 0946  Weight: 190 lb 8 oz (86.4 kg)   .Body mass index is 27.33 kg/m.   GENERAL:alert, in no acute distress and comfortable SKIN: no acute rashes, no significant lesions EYES: conjunctiva are pink and non-injected, sclera anicteric OROPHARYNX: MMM, no exudates, no oropharyngeal erythema or ulceration NECK: supple, no JVD LYMPH:  no palpable lymphadenopathy in the cervical, axillary or inguinal regions LUNGS: clear to auscultation b/l with normal respiratory effort HEART: regular rate & rhythm ABDOMEN:  normoactive bowel sounds , non tender, not distended. Extremity: no pedal edema PSYCH: alert & oriented x 3 with fluent speech NEURO: no focal motor/sensory deficits  LABORATORY DATA:  I have reviewed the data as listed  CBC Latest Ref Rng & Units 08/17/2020 08/02/2020 07/20/2020  WBC 4.0 - 10.5 K/uL 9.4 10.1 11.3(H)  Hemoglobin 13.0 - 17.0 g/dL 13.0 14.0 13.4  Hematocrit 39.0 - 52.0 % 40.4 45.0 43.1  Platelets 150 - 400 K/uL 315 295 237    CMP Latest Ref Rng & Units 08/17/2020 08/02/2020 07/20/2020  Glucose 70 -  99 mg/dL 128(H) 157(H) 101(H)  BUN 8 - 23 mg/dL 21 18 18   Creatinine 0.61 - 1.24 mg/dL 1.22 1.36(H) 1.22  Sodium 135 - 145 mmol/L 142 140 140  Potassium 3.5 - 5.1 mmol/L 4.2 3.8 4.4  Chloride 98 - 111 mmol/L 104 101 102   CO2 22 - 32 mmol/L 29 29 29   Calcium 8.9 - 10.3 mg/dL 9.3 9.4 9.1  Total Protein 6.5 - 8.1 g/dL 6.2(L) 7.0 6.8  Total Bilirubin 0.3 - 1.2 mg/dL 0.5 0.5 0.9  Alkaline Phos 38 - 126 U/L 66 85 72  AST 15 - 41 U/L 21 17 20   ALT 0 - 44 U/L 40 38 36   Component     Latest Ref Rng & Units 06/21/2020  Folate, Hemolysate     Not Estab. ng/mL >620.0  HCT     37.5 - 51.0 % 46.8  Folate, RBC     >498 ng/mL >1,325  Prothrombin Time     11.4 - 15.2 seconds 13.0  INR     0.8 - 1.2 1.0  LDH     98 - 192 U/L 254 (H)  Immature Platelet Fraction     1.2 - 8.6 % 9.7 (H)  ABO/RH(D)      O POS . . .  HCV Ab     NON REACTIVE NON REACTIVE  Platelet CT in Citrate      EDTA platelet count consistent with citrate.  Fibrinogen     210 - 475 mg/dL 354  APTT     24 - 36 seconds 28  Haptoglobin     34 - 355 mg/dL 141  Vitamin B12     180 - 914 pg/mL 458    RADIOGRAPHIC STUDIES: I have personally reviewed the radiological images as listed and agreed with the findings in the report. No results found.  ASSESSMENT & PLAN:   80 y.o. with   1) Isolated thrombocytopenia Likely ITP  PLAN: -Discussed pt labwork today, 08/17/2020; Plt normal, other counts stable, chemistries normal. -Advised pt we will continue with 5 mg Prednisone daily for the next month. We will then switch to every other day for a month. -Recommended pt start taking a Vitamin B-Complex daily.  -Continue Multivitamin daily. -Will see back in 2 months with labs. -Will get labs in 4 weeks.   FOLLOW UP: Labs in 1 month RTC with Dr Irene Limbo with labs in 2 months   All of the patients questions were answered with apparent satisfaction. The patient knows to call the clinic with any problems, questions or concerns.  The total time spent in the appointment was 20 minutes and more than 50% was on counseling and direct patient cares.     Sullivan Lone MD [place] AAHIVMS [facility] Hematology/Oncology Physician [facility]  (Office):       [phone] (Work cell):  [phone] (Fax):           [phone]  08/17/2020 10:40 AM  I, Reinaldo Raddle, am acting as scribe for Dr. Sullivan Lone, MD.   .I have reviewed the above documentation for accuracy and completeness, and I agree with the above. Brunetta Genera MD

## 2020-08-17 ENCOUNTER — Other Ambulatory Visit: Payer: Self-pay

## 2020-08-17 ENCOUNTER — Telehealth: Payer: Self-pay | Admitting: Hematology

## 2020-08-17 ENCOUNTER — Inpatient Hospital Stay (HOSPITAL_BASED_OUTPATIENT_CLINIC_OR_DEPARTMENT_OTHER): Payer: Medicare Other | Admitting: Hematology

## 2020-08-17 ENCOUNTER — Inpatient Hospital Stay: Payer: Medicare Other

## 2020-08-17 VITALS — BP 124/69 | HR 62 | Temp 97.6°F | Resp 17 | Wt 190.5 lb

## 2020-08-17 DIAGNOSIS — D696 Thrombocytopenia, unspecified: Secondary | ICD-10-CM | POA: Diagnosis not present

## 2020-08-17 DIAGNOSIS — D693 Immune thrombocytopenic purpura: Secondary | ICD-10-CM | POA: Diagnosis not present

## 2020-08-17 DIAGNOSIS — N189 Chronic kidney disease, unspecified: Secondary | ICD-10-CM | POA: Diagnosis not present

## 2020-08-17 DIAGNOSIS — I129 Hypertensive chronic kidney disease with stage 1 through stage 4 chronic kidney disease, or unspecified chronic kidney disease: Secondary | ICD-10-CM | POA: Diagnosis not present

## 2020-08-17 DIAGNOSIS — E039 Hypothyroidism, unspecified: Secondary | ICD-10-CM | POA: Diagnosis not present

## 2020-08-17 DIAGNOSIS — R768 Other specified abnormal immunological findings in serum: Secondary | ICD-10-CM | POA: Diagnosis not present

## 2020-08-17 DIAGNOSIS — Z7952 Long term (current) use of systemic steroids: Secondary | ICD-10-CM | POA: Diagnosis not present

## 2020-08-17 LAB — CBC WITH DIFFERENTIAL (CANCER CENTER ONLY)
Abs Immature Granulocytes: 0.12 10*3/uL — ABNORMAL HIGH (ref 0.00–0.07)
Basophils Absolute: 0.1 10*3/uL (ref 0.0–0.1)
Basophils Relative: 1 %
Eosinophils Absolute: 0.1 10*3/uL (ref 0.0–0.5)
Eosinophils Relative: 1 %
HCT: 40.4 % (ref 39.0–52.0)
Hemoglobin: 13 g/dL (ref 13.0–17.0)
Immature Granulocytes: 1 %
Lymphocytes Relative: 16 %
Lymphs Abs: 1.5 10*3/uL (ref 0.7–4.0)
MCH: 26.4 pg (ref 26.0–34.0)
MCHC: 32.2 g/dL (ref 30.0–36.0)
MCV: 81.9 fL (ref 80.0–100.0)
Monocytes Absolute: 0.9 10*3/uL (ref 0.1–1.0)
Monocytes Relative: 10 %
Neutro Abs: 6.7 10*3/uL (ref 1.7–7.7)
Neutrophils Relative %: 71 %
Platelet Count: 315 10*3/uL (ref 150–400)
RBC: 4.93 MIL/uL (ref 4.22–5.81)
RDW: 19.3 % — ABNORMAL HIGH (ref 11.5–15.5)
WBC Count: 9.4 10*3/uL (ref 4.0–10.5)
nRBC: 0 % (ref 0.0–0.2)

## 2020-08-17 LAB — CMP (CANCER CENTER ONLY)
ALT: 40 U/L (ref 0–44)
AST: 21 U/L (ref 15–41)
Albumin: 3.4 g/dL — ABNORMAL LOW (ref 3.5–5.0)
Alkaline Phosphatase: 66 U/L (ref 38–126)
Anion gap: 9 (ref 5–15)
BUN: 21 mg/dL (ref 8–23)
CO2: 29 mmol/L (ref 22–32)
Calcium: 9.3 mg/dL (ref 8.9–10.3)
Chloride: 104 mmol/L (ref 98–111)
Creatinine: 1.22 mg/dL (ref 0.61–1.24)
GFR, Estimated: >60 mL/min (ref >60)
Glucose, Bld: 128 mg/dL — ABNORMAL HIGH (ref 70–99)
Potassium: 4.2 mmol/L (ref 3.5–5.1)
Sodium: 142 mmol/L (ref 135–145)
Total Bilirubin: 0.5 mg/dL (ref 0.3–1.2)
Total Protein: 6.2 g/dL — ABNORMAL LOW (ref 6.5–8.1)

## 2020-08-17 LAB — IMMATURE PLATELET FRACTION: Immature Platelet Fraction: 1.5 % (ref 1.2–8.6)

## 2020-08-17 MED ORDER — PREDNISONE 5 MG PO TABS: 5.0000 mg | ORAL_TABLET | Freq: Every day | ORAL | 0 refills | Status: DC

## 2020-08-17 NOTE — Telephone Encounter (Signed)
Scheduled follow-up appointments per 5/26 los. Patient is aware.

## 2020-09-18 ENCOUNTER — Other Ambulatory Visit: Payer: Self-pay

## 2020-09-18 ENCOUNTER — Other Ambulatory Visit: Payer: Self-pay | Admitting: *Deleted

## 2020-09-18 DIAGNOSIS — D693 Immune thrombocytopenic purpura: Secondary | ICD-10-CM

## 2020-09-19 ENCOUNTER — Other Ambulatory Visit: Payer: Self-pay

## 2020-09-19 ENCOUNTER — Inpatient Hospital Stay: Payer: Medicare Other | Attending: Hematology

## 2020-09-19 DIAGNOSIS — D696 Thrombocytopenia, unspecified: Secondary | ICD-10-CM | POA: Diagnosis not present

## 2020-09-19 DIAGNOSIS — D693 Immune thrombocytopenic purpura: Secondary | ICD-10-CM

## 2020-09-19 LAB — CBC WITH DIFFERENTIAL (CANCER CENTER ONLY)
Abs Immature Granulocytes: 0.03 10*3/uL (ref 0.00–0.07)
Basophils Absolute: 0.1 10*3/uL (ref 0.0–0.1)
Basophils Relative: 1 %
Eosinophils Absolute: 0.2 10*3/uL (ref 0.0–0.5)
Eosinophils Relative: 3 %
HCT: 43.2 % (ref 39.0–52.0)
Hemoglobin: 13.6 g/dL (ref 13.0–17.0)
Immature Granulocytes: 0 %
Lymphocytes Relative: 22 %
Lymphs Abs: 1.6 10*3/uL (ref 0.7–4.0)
MCH: 26.7 pg (ref 26.0–34.0)
MCHC: 31.5 g/dL (ref 30.0–36.0)
MCV: 84.7 fL (ref 80.0–100.0)
Monocytes Absolute: 1 10*3/uL (ref 0.1–1.0)
Monocytes Relative: 14 %
Neutro Abs: 4.5 10*3/uL (ref 1.7–7.7)
Neutrophils Relative %: 60 %
Platelet Count: 319 10*3/uL (ref 150–400)
RBC: 5.1 MIL/uL (ref 4.22–5.81)
RDW: 18.6 % — ABNORMAL HIGH (ref 11.5–15.5)
WBC Count: 7.4 10*3/uL (ref 4.0–10.5)
nRBC: 0 % (ref 0.0–0.2)

## 2020-09-19 LAB — CMP (CANCER CENTER ONLY)
ALT: 26 U/L (ref 0–44)
AST: 27 U/L (ref 15–41)
Albumin: 3.6 g/dL (ref 3.5–5.0)
Alkaline Phosphatase: 63 U/L (ref 38–126)
Anion gap: 9 (ref 5–15)
BUN: 16 mg/dL (ref 8–23)
CO2: 27 mmol/L (ref 22–32)
Calcium: 9.1 mg/dL (ref 8.9–10.3)
Chloride: 104 mmol/L (ref 98–111)
Creatinine: 1.45 mg/dL — ABNORMAL HIGH (ref 0.61–1.24)
GFR, Estimated: 50 mL/min — ABNORMAL LOW (ref >60)
Glucose, Bld: 106 mg/dL — ABNORMAL HIGH (ref 70–99)
Potassium: 4.8 mmol/L (ref 3.5–5.1)
Sodium: 140 mmol/L (ref 135–145)
Total Bilirubin: 0.6 mg/dL (ref 0.3–1.2)
Total Protein: 6.9 g/dL (ref 6.5–8.1)

## 2020-09-19 LAB — IMMATURE PLATELET FRACTION: Immature Platelet Fraction: 1.6 % (ref 1.2–8.6)

## 2020-09-26 DIAGNOSIS — Z20822 Contact with and (suspected) exposure to covid-19: Secondary | ICD-10-CM | POA: Diagnosis not present

## 2020-10-16 ENCOUNTER — Other Ambulatory Visit: Payer: Self-pay

## 2020-10-16 DIAGNOSIS — D693 Immune thrombocytopenic purpura: Secondary | ICD-10-CM

## 2020-10-16 DIAGNOSIS — D1801 Hemangioma of skin and subcutaneous tissue: Secondary | ICD-10-CM | POA: Diagnosis not present

## 2020-10-16 DIAGNOSIS — D692 Other nonthrombocytopenic purpura: Secondary | ICD-10-CM | POA: Diagnosis not present

## 2020-10-16 DIAGNOSIS — L821 Other seborrheic keratosis: Secondary | ICD-10-CM | POA: Diagnosis not present

## 2020-10-16 NOTE — Progress Notes (Signed)
HEMATOLOGY/ONCOLOGY CLINIC NOTE  Date of Service: 10/16/2020  Patient Care Team: Crist Infante, MD as PCP - General (Internal Medicine)  CHIEF COMPLAINTS/PURPOSE OF CONSULTATION:  F/u for acute ITP  HISTORY OF PRESENTING ILLNESS:   Douglas [name] is a wonderful 80 y.o. male who has been referred to [place] by Dr. Joylene Draft for evaluation and management of thrombocytopenia. The pt reports that she is doing well overall.  The pt reports that he was referred here for low Plt found during routine labs. He notes that his last labs six months prior to this were normal. The pt notes no bleeding issues. The pt notes he was working in the yard on Saturday and his left hand got swollen. It is now slowly getting better and turning blue in color. He notes no other symptoms alongside this and no pain. He notes he was not wearing gloves and was reaching into places that could have snakes or spiders. The pt notes he did not go the ED. This was prior to the pt's labs being drawn. The pt notes his wife puts a Benadryl on it and this helped to reduce the swelling. The pt notes that he taken Spiriva, but nothing has happened. The pt notes no other acute medication changes. The pt notes all other medical issues have been stable.  The pt notes he does not drink alcohol. The pt notes a possible Alpha Gal allergy, but denies any other food allergies. He eats lots of chicken and fresh fruits/vegetables. The pt denies any autoimmune conditions in his family and notes no change whatsoever in the last six months. The pt is unaware the cause of his hypothyroidism, but it is well controlled. He denies any concerns over liver abnormalities. The pt notes the Alpha Gal was diagnosed one year ago and he was experiencing SOB and difficulty breathing after eating red meat. He notes 2-3 episodes yearly for ten years and this was found in doing the panel for allergies. The pt notes no incidences since giving up beef and pork. The pt had  elevated IgE antibodies. The pt notes he tries to avoid NSAIDs and does not take any herbal supplements. The pt denies any need for transfusions or received any blood products.  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 The pt notes he was exposed to lead and acid, but not significantly. The pt notes that his daughter in law passed away of Leukemia two years ago.  Lab results 06/20/2020 of CBC w/diff and CMP is as follows: all values are WNL except for Plt of 39K, MCH of 25.8, MCHC of 30.5, RDW of 18.1. 12/13/2019 Plt of 289K.  On review of systems, pt reports recent animal bite on hand and denies new lumps/bumps, hand pain, bleeding issues, sudden weight loss, fevers, chills, recent vaccinations, antibiotic usage, sore throat, throat pain, runny nose, other acute skin rashes, unusual food intake, SOB, chest pain, changes in breathing, testicular pain/swelling, and any other symptoms.  INTERVAL HISTORY  Douglas [name] is a wonderful 80 y.o. male who is here today for evaluation and management of thrombocytopenia. The patient's last visit with [place] was on 08/17/2020. The pt reports that he is doing well overall.  The pt reports he has been doing well and is now down to 5 mg p.o. daily of prednisone.  Denies any issues with taking the p.o. prednisone.  No bleeding or excessive bruising issues.  Lab results today 10/17/2020 of CBC w/diff shows normal platelets at 282k.  Other blood counts unremarkable.  Blood chemistries stable and no significant changes.  On review of systems, pt reports no other acute new symptoms.   MEDICAL HISTORY:  Past Medical History:  Diagnosis Date   Allergy    seasonal   Anemia    Bilateral inguinal hernia    Chronic kidney disease    h/o- renal calculi- passed spont. , 10 episodes over the course of 20 yrs.    GERD (gastroesophageal  reflux disease)    Hyperlipidemia    Hypertension    Hypothyroidism    Osteoporosis    Sleep apnea    CPAP q night - study done 8-10 yrs. ago    Testosterone deficiency    injections q 3 week- July 8- last injection    Thyroid disease     SURGICAL HISTORY: Past Surgical History:  Procedure Laterality Date   ANAL FISSURE REPAIR     CHOLECYSTECTOMY     COLONOSCOPY     FIBULA FRACTURE SURGERY     fibula and tibia fracture    FRACTURE SURGERY     L leg -plate & screws - ORIF- fibula   HEMORRHOID SURGERY     HERNIA REPAIR  04/19/11   bilateral   LAPAROSCOPIC INGUINAL HERNIA REPAIR  04/19/11   bilateral   LAPAROSCOPY  10/11/2011   Procedure: LAPAROSCOPY DIAGNOSTIC;  Surgeon: Adin Hector, MD;  Location: [place];  Service: General;  Laterality: N/A;  UMBILICAL EXPLORATION   MASS EXCISION  10/11/2011   Procedure: EXCISION MASS;  Surgeon: Adin Hector, MD;  Location: [place];  Service: General;  Laterality: N/A;  EXCISION OF UMBILICAL MASS   POLYPECTOMY      SOCIAL HISTORY: Social History   Socioeconomic History   Marital status: Married    Spouse name: Not on file   Number of children: Not on file   Years of education: Not on file   Highest education level: Not on file  Occupational History   Not on file  Tobacco Use   Smoking status: Former    Types: Cigarettes    Quit date: 06/05/1998    Years since quitting: 22.3   Smokeless tobacco: Never  Vaping Use   Vaping Use: Never used  Substance and Sexual Activity   Alcohol use: No   Drug use: No   Sexual activity: Not on file  Other Topics Concern   Not on file  Social History Narrative   Not on file   Social Determinants of Health   Financial Resource Strain: Not on file  Food Insecurity: Not on file  Transportation Needs: Not on file  Physical Activity: Not on file  Stress: Not on file  Social Connections: Not on file  Intimate Partner Violence: Not on file    FAMILY HISTORY: Family History  Problem  Relation Age of Onset   Stroke Mother    Stroke Father    Colon cancer Neg Hx    Rectal cancer Neg Hx    Stomach cancer Neg Hx    Esophageal cancer Neg Hx    Colon polyps Neg Hx    Allergic rhinitis Neg Hx    Asthma Neg Hx    Angioedema Neg Hx    Atopy Neg Hx    Eczema Neg Hx    Immunodeficiency Neg Hx    Urticaria Neg Hx     ALLERGIES:  is allergic to alpha-gal.  MEDICATIONS:  Current Outpatient Medications  Medication Sig Dispense Refill   Calcium Carbonate-Vitamin D (CALCIUM-VITAMIN D) 600-125 MG-UNIT TABS Take by mouth daily.     cetirizine (ZYRTEC) 5 MG tablet Take 10 mg by mouth daily.      cholecalciferol (VITAMIN D) 1000 UNITS tablet Take 1,000 Units by mouth 2 (two) times daily.     denosumab (PROLIA) 60 MG/ML SOSY injection      EPINEPHrine (EPIPEN 2-PAK) 0.3 mg/0.3 mL IJ SOAJ injection Inject 0.3 mg into the muscle as needed for anaphylaxis.     famotidine (PEPCID) 20 MG tablet Take 1 tablet (20 mg total) by mouth 2 (two) times daily. 180 tablet 1   fish oil-omega-3 fatty acids 1000 MG capsule Take 1 g by mouth 4 (four) times daily.      levothyroxine (SYNTHROID) 125 MCG tablet Take 125 mcg by mouth every morning.     levothyroxine (SYNTHROID, LEVOTHROID) 112 MCG tablet Take 112 mcg by mouth daily.     metroNIDAZOLE (METROGEL) 0.75 % gel Apply 1 application topically 2 (two) times daily.     Multiple Vitamin (MULTIVITAMIN PO) Take by mouth daily.       OVER THE COUNTER MEDICATION Magnesium '500mg'$  po daily     pantoprazole (PROTONIX) 40 MG tablet Take 40 mg by mouth daily.     predniSONE (DELTASONE) 5 MG tablet Take 1 tablet (5 mg total) by mouth daily with breakfast. 60 tablet 0   simvastatin (ZOCOR) 40 MG tablet Take 40 mg by mouth every evening.     telmisartan (MICARDIS) 80 MG tablet Take 80 mg by mouth daily.     testosterone cypionate (DEPOTESTOTERONE CYPIONATE) 100 MG/ML injection Inject into the muscle every 21 ( twenty-one) days. For IM use only     No  current facility-administered medications for this visit.    REVIEW OF SYSTEMS:   10 Point review of Systems was done is negative except as noted above.  PHYSICAL EXAMINATION: ECOG PERFORMANCE STATUS: 0 - Asymptomatic  Vitals:   10/17/20 0953  BP: 129/68  Pulse: 62  Resp: 18  Temp: 98.7 F (37.1 C)  SpO2: 97%    Filed Weights   10/17/20 0953  Weight: 189 lb 3.2 oz (85.8 kg)    .Body mass index is 27.15 kg/m.  NAD GENERAL:alert, in no acute distress and comfortable SKIN: no acute rashes, no significant lesions EYES: conjunctiva are pink and non-injected, sclera anicteric OROPHARYNX: MMM, no exudates, no oropharyngeal erythema or ulceration NECK: supple, no JVD LYMPH:  no palpable lymphadenopathy in the cervical, axillary or inguinal regions LUNGS: clear to auscultation b/l with normal respiratory effort HEART: regular rate & rhythm ABDOMEN:  normoactive bowel sounds , non tender, not distended. Extremity: no pedal edema PSYCH: alert & oriented x 3 with fluent speech NEURO: no focal motor/sensory deficits  LABORATORY DATA:  I have reviewed the data as listed  CBC Latest Ref Rng & Units 10/17/2020 09/19/2020 08/17/2020  WBC 4.0 - 10.5 K/uL 7.0 7.4 9.4  Hemoglobin 13.0 - 17.0 g/dL 12.8(L) 13.6 13.0  Hematocrit 39.0 - 52.0 % 39.8 43.2 40.4  Platelets 150 - 400 K/uL 282 319 315    CMP Latest Ref Rng & Units 10/17/2020 09/19/2020 08/17/2020  Glucose 70 - 99 mg/dL 110(H) 106(H) 128(H)  BUN 8 - 23 mg/dL '15 16 21  '$ Creatinine 0.61 - 1.24 mg/dL 1.26(H) 1.45(H) 1.22  Sodium 135 - 145 mmol/L 141 140 142  Potassium 3.5 - 5.1  mmol/L 4.3 4.8 4.2  Chloride 98 - 111 mmol/L 106 104 104  CO2 22 - 32 mmol/L '28 27 29  '$ Calcium 8.9 - 10.3 mg/dL 9.1 9.1 9.3  Total Protein 6.5 - 8.1 g/dL 6.6 6.9 6.2(L)  Total Bilirubin 0.3 - 1.2 mg/dL 0.5 0.6 0.5  Alkaline Phos 38 - 126 U/L 57 63 66  AST 15 - 41 U/L '23 27 21  '$ ALT 0 - 44 U/L 28 26 40   Component     Latest Ref Rng & Units 06/21/2020   Folate, Hemolysate     Not Estab. ng/mL >620.0  HCT     37.5 - 51.0 % 46.8  Folate, RBC     >498 ng/mL >1,325  Prothrombin Time     11.4 - 15.2 seconds 13.0  INR     0.8 - 1.2 1.0  LDH     98 - 192 U/L 254 (H)  Immature Platelet Fraction     1.2 - 8.6 % 9.7 (H)  ABO/RH(D)      O POS . . .  HCV Ab     NON REACTIVE NON REACTIVE  Platelet CT in Citrate      EDTA platelet count consistent with citrate.  Fibrinogen     210 - 475 mg/dL 354  APTT     24 - 36 seconds 28  Haptoglobin     34 - 355 mg/dL 141  Vitamin B12     180 - 914 pg/mL 458    RADIOGRAPHIC STUDIES: I have personally reviewed the radiological images as listed and agreed with the findings in the report. No results found.  ASSESSMENT & PLAN:   80 y.o. with   1) Isolated thrombocytopenia Likely acute ITP  PLAN: -Discussed pt labwork today, 10/17/2020; platelets are completely normal at 282k. -He continues to taper down his prednisone and his platelets are still remaining normal without any overt evidence of relapse of his acute ITP. -He shall reduce the prednisone to 5 mg every other day for a month and then stop the prednisone. -Continue to start taking a Vitamin B-Complex daily.  -Continue Multivitamin daily.  FOLLOW UP: Phone visit with Dr Irene Limbo in 6 weeks Labs 1 day prior to phone visit   All of the patients questions were answered with apparent satisfaction. The patient knows to call the clinic with any problems, questions or concerns.  The total time spent in the appointment was 20 minutes and more than 50% was on counseling and direct patient cares.     Sullivan Lone MD [place] AAHIVMS [facility] Hematology/Oncology Physician [place]  10/16/2020 6:56 PM  I, Reinaldo Raddle, am acting as scribe for Dr. Sullivan Lone, MD.   .I have reviewed the above documentation for accuracy and completeness, and I agree with the above. Brunetta Genera MD

## 2020-10-17 ENCOUNTER — Ambulatory Visit: Payer: Medicare Other | Admitting: Hematology

## 2020-10-17 ENCOUNTER — Inpatient Hospital Stay (HOSPITAL_BASED_OUTPATIENT_CLINIC_OR_DEPARTMENT_OTHER): Payer: Medicare Other | Admitting: Hematology

## 2020-10-17 ENCOUNTER — Other Ambulatory Visit: Payer: Self-pay

## 2020-10-17 ENCOUNTER — Inpatient Hospital Stay: Payer: Medicare Other | Attending: Hematology

## 2020-10-17 VITALS — BP 129/68 | HR 62 | Temp 98.7°F | Resp 18 | Wt 189.2 lb

## 2020-10-17 DIAGNOSIS — E785 Hyperlipidemia, unspecified: Secondary | ICD-10-CM | POA: Diagnosis not present

## 2020-10-17 DIAGNOSIS — Z7952 Long term (current) use of systemic steroids: Secondary | ICD-10-CM | POA: Diagnosis not present

## 2020-10-17 DIAGNOSIS — I129 Hypertensive chronic kidney disease with stage 1 through stage 4 chronic kidney disease, or unspecified chronic kidney disease: Secondary | ICD-10-CM | POA: Insufficient documentation

## 2020-10-17 DIAGNOSIS — R768 Other specified abnormal immunological findings in serum: Secondary | ICD-10-CM | POA: Insufficient documentation

## 2020-10-17 DIAGNOSIS — E039 Hypothyroidism, unspecified: Secondary | ICD-10-CM | POA: Diagnosis not present

## 2020-10-17 DIAGNOSIS — D693 Immune thrombocytopenic purpura: Secondary | ICD-10-CM

## 2020-10-17 DIAGNOSIS — D696 Thrombocytopenia, unspecified: Secondary | ICD-10-CM | POA: Insufficient documentation

## 2020-10-17 DIAGNOSIS — Z79899 Other long term (current) drug therapy: Secondary | ICD-10-CM | POA: Insufficient documentation

## 2020-10-17 DIAGNOSIS — K219 Gastro-esophageal reflux disease without esophagitis: Secondary | ICD-10-CM | POA: Diagnosis not present

## 2020-10-17 DIAGNOSIS — N189 Chronic kidney disease, unspecified: Secondary | ICD-10-CM | POA: Insufficient documentation

## 2020-10-17 LAB — CMP (CANCER CENTER ONLY)
ALT: 28 U/L (ref 0–44)
AST: 23 U/L (ref 15–41)
Albumin: 3.4 g/dL — ABNORMAL LOW (ref 3.5–5.0)
Alkaline Phosphatase: 57 U/L (ref 38–126)
Anion gap: 7 (ref 5–15)
BUN: 15 mg/dL (ref 8–23)
CO2: 28 mmol/L (ref 22–32)
Calcium: 9.1 mg/dL (ref 8.9–10.3)
Chloride: 106 mmol/L (ref 98–111)
Creatinine: 1.26 mg/dL — ABNORMAL HIGH (ref 0.61–1.24)
GFR, Estimated: 59 mL/min — ABNORMAL LOW (ref >60)
Glucose, Bld: 110 mg/dL — ABNORMAL HIGH (ref 70–99)
Potassium: 4.3 mmol/L (ref 3.5–5.1)
Sodium: 141 mmol/L (ref 135–145)
Total Bilirubin: 0.5 mg/dL (ref 0.3–1.2)
Total Protein: 6.6 g/dL (ref 6.5–8.1)

## 2020-10-17 LAB — CBC WITH DIFFERENTIAL (CANCER CENTER ONLY)
Abs Immature Granulocytes: 0.03 10*3/uL (ref 0.00–0.07)
Basophils Absolute: 0.1 10*3/uL (ref 0.0–0.1)
Basophils Relative: 1 %
Eosinophils Absolute: 0.2 10*3/uL (ref 0.0–0.5)
Eosinophils Relative: 2 %
HCT: 39.8 % (ref 39.0–52.0)
Hemoglobin: 12.8 g/dL — ABNORMAL LOW (ref 13.0–17.0)
Immature Granulocytes: 0 %
Lymphocytes Relative: 18 %
Lymphs Abs: 1.3 10*3/uL (ref 0.7–4.0)
MCH: 26.9 pg (ref 26.0–34.0)
MCHC: 32.2 g/dL (ref 30.0–36.0)
MCV: 83.6 fL (ref 80.0–100.0)
Monocytes Absolute: 0.8 10*3/uL (ref 0.1–1.0)
Monocytes Relative: 12 %
Neutro Abs: 4.6 10*3/uL (ref 1.7–7.7)
Neutrophils Relative %: 67 %
Platelet Count: 282 10*3/uL (ref 150–400)
RBC: 4.76 MIL/uL (ref 4.22–5.81)
RDW: 17.3 % — ABNORMAL HIGH (ref 11.5–15.5)
WBC Count: 7 10*3/uL (ref 4.0–10.5)
nRBC: 0 % (ref 0.0–0.2)

## 2020-10-17 LAB — IMMATURE PLATELET FRACTION: Immature Platelet Fraction: 1.9 % (ref 1.2–8.6)

## 2020-10-22 DIAGNOSIS — E785 Hyperlipidemia, unspecified: Secondary | ICD-10-CM | POA: Diagnosis not present

## 2020-10-22 DIAGNOSIS — N1831 Chronic kidney disease, stage 3a: Secondary | ICD-10-CM | POA: Diagnosis not present

## 2020-10-22 DIAGNOSIS — E1122 Type 2 diabetes mellitus with diabetic chronic kidney disease: Secondary | ICD-10-CM | POA: Diagnosis not present

## 2020-10-22 DIAGNOSIS — I129 Hypertensive chronic kidney disease with stage 1 through stage 4 chronic kidney disease, or unspecified chronic kidney disease: Secondary | ICD-10-CM | POA: Diagnosis not present

## 2020-10-30 DIAGNOSIS — M542 Cervicalgia: Secondary | ICD-10-CM | POA: Diagnosis not present

## 2020-10-30 DIAGNOSIS — M25511 Pain in right shoulder: Secondary | ICD-10-CM | POA: Diagnosis not present

## 2020-11-06 ENCOUNTER — Encounter (HOSPITAL_COMMUNITY): Payer: Medicare Other

## 2020-11-13 DIAGNOSIS — M25511 Pain in right shoulder: Secondary | ICD-10-CM | POA: Diagnosis not present

## 2020-11-20 DIAGNOSIS — M25511 Pain in right shoulder: Secondary | ICD-10-CM | POA: Diagnosis not present

## 2020-11-23 DIAGNOSIS — M25511 Pain in right shoulder: Secondary | ICD-10-CM | POA: Diagnosis not present

## 2020-11-28 ENCOUNTER — Other Ambulatory Visit: Payer: Self-pay

## 2020-11-28 ENCOUNTER — Inpatient Hospital Stay: Payer: Medicare Other | Attending: Hematology

## 2020-11-28 DIAGNOSIS — Z7952 Long term (current) use of systemic steroids: Secondary | ICD-10-CM | POA: Insufficient documentation

## 2020-11-28 DIAGNOSIS — D696 Thrombocytopenia, unspecified: Secondary | ICD-10-CM | POA: Diagnosis not present

## 2020-11-28 LAB — CMP (CANCER CENTER ONLY)
ALT: 25 U/L (ref 0–44)
AST: 22 U/L (ref 15–41)
Albumin: 3.7 g/dL (ref 3.5–5.0)
Alkaline Phosphatase: 63 U/L (ref 38–126)
Anion gap: 8 (ref 5–15)
BUN: 12 mg/dL (ref 8–23)
CO2: 28 mmol/L (ref 22–32)
Calcium: 9.1 mg/dL (ref 8.9–10.3)
Chloride: 104 mmol/L (ref 98–111)
Creatinine: 1.27 mg/dL — ABNORMAL HIGH (ref 0.61–1.24)
GFR, Estimated: 58 mL/min — ABNORMAL LOW (ref >60)
Glucose, Bld: 109 mg/dL — ABNORMAL HIGH (ref 70–99)
Potassium: 5 mmol/L (ref 3.5–5.1)
Sodium: 140 mmol/L (ref 135–145)
Total Bilirubin: 0.6 mg/dL (ref 0.3–1.2)
Total Protein: 7 g/dL (ref 6.5–8.1)

## 2020-11-28 LAB — CBC WITH DIFFERENTIAL (CANCER CENTER ONLY)
Abs Immature Granulocytes: 0.03 10*3/uL (ref 0.00–0.07)
Basophils Absolute: 0.1 10*3/uL (ref 0.0–0.1)
Basophils Relative: 1 %
Eosinophils Absolute: 0.4 10*3/uL (ref 0.0–0.5)
Eosinophils Relative: 5 %
HCT: 43.5 % (ref 39.0–52.0)
Hemoglobin: 13.5 g/dL (ref 13.0–17.0)
Immature Granulocytes: 0 %
Lymphocytes Relative: 16 %
Lymphs Abs: 1.3 10*3/uL (ref 0.7–4.0)
MCH: 26.5 pg (ref 26.0–34.0)
MCHC: 31 g/dL (ref 30.0–36.0)
MCV: 85.3 fL (ref 80.0–100.0)
Monocytes Absolute: 1 10*3/uL (ref 0.1–1.0)
Monocytes Relative: 13 %
Neutro Abs: 5.3 10*3/uL (ref 1.7–7.7)
Neutrophils Relative %: 65 %
Platelet Count: 297 10*3/uL (ref 150–400)
RBC: 5.1 MIL/uL (ref 4.22–5.81)
RDW: 17.2 % — ABNORMAL HIGH (ref 11.5–15.5)
WBC Count: 8 10*3/uL (ref 4.0–10.5)
nRBC: 0 % (ref 0.0–0.2)

## 2020-11-28 LAB — IMMATURE PLATELET FRACTION: Immature Platelet Fraction: 1.7 % (ref 1.2–8.6)

## 2020-11-29 ENCOUNTER — Inpatient Hospital Stay (HOSPITAL_BASED_OUTPATIENT_CLINIC_OR_DEPARTMENT_OTHER): Payer: Medicare Other | Admitting: Hematology

## 2020-11-29 DIAGNOSIS — D693 Immune thrombocytopenic purpura: Secondary | ICD-10-CM | POA: Diagnosis not present

## 2020-11-29 DIAGNOSIS — D696 Thrombocytopenia, unspecified: Secondary | ICD-10-CM | POA: Diagnosis not present

## 2020-11-29 DIAGNOSIS — Z7952 Long term (current) use of systemic steroids: Secondary | ICD-10-CM | POA: Diagnosis not present

## 2020-11-30 ENCOUNTER — Telehealth: Payer: Self-pay | Admitting: Hematology

## 2020-11-30 DIAGNOSIS — R531 Weakness: Secondary | ICD-10-CM | POA: Diagnosis not present

## 2020-11-30 DIAGNOSIS — M542 Cervicalgia: Secondary | ICD-10-CM | POA: Diagnosis not present

## 2020-11-30 NOTE — Telephone Encounter (Signed)
Left message with follow-up appointments per 9/7 los. 

## 2020-12-05 NOTE — Progress Notes (Signed)
HEMATOLOGY/ONCOLOGY CLINIC NOTE  Date of Service: .11/29/2020   Patient Care Team: Crist Infante, MD as PCP - General (Internal Medicine)  CHIEF COMPLAINTS/PURPOSE OF CONSULTATION:  F/u for acute ITP  HISTORY OF PRESENTING ILLNESS:   Douglas [name] is a wonderful 80 y.o. male who has been referred to [place] by Dr. Joylene Draft for evaluation and management of thrombocytopenia. The pt reports that she is doing well overall.  The pt reports that he was referred here for low Plt found during routine labs. He notes that his last labs six months prior to this were normal. The pt notes no bleeding issues. The pt notes he was working in the yard on Saturday and his left hand got swollen. It is now slowly getting better and turning blue in color. He notes no other symptoms alongside this and no pain. He notes he was not wearing gloves and was reaching into places that could have snakes or spiders. The pt notes he did not go the ED. This was prior to the pt's labs being drawn. The pt notes his wife puts a Benadryl on it and this helped to reduce the swelling. The pt notes that he taken Spiriva, but nothing has happened. The pt notes no other acute medication changes. The pt notes all other medical issues have been stable.  The pt notes he does not drink alcohol. The pt notes a possible Alpha Gal allergy, but denies any other food allergies. He eats lots of chicken and fresh fruits/vegetables. The pt denies any autoimmune conditions in his family and notes no change whatsoever in the last six months. The pt is unaware the cause of his hypothyroidism, but it is well controlled. He denies any concerns over liver abnormalities. The pt notes the Alpha Gal was diagnosed one year ago and he was experiencing SOB and difficulty breathing after eating red meat. He notes 2-3 episodes yearly for ten years and this was found in doing the panel for allergies. The pt notes no incidences since giving up beef and pork. The pt  had elevated IgE antibodies. The pt notes he tries to avoid NSAIDs and does not take any herbal supplements. The pt denies any need for transfusions or received any blood products.  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 The pt notes he was exposed to lead and acid, but not significantly. The pt notes that his daughter in law passed away of Leukemia two years ago.  Lab results 06/20/2020 of CBC w/diff and CMP is as follows: all values are WNL except for Plt of 39K, MCH of 25.8, MCHC of 30.5, RDW of 18.1. 12/13/2019 Plt of 289K.  On review of systems, pt reports recent animal bite on hand and denies new lumps/bumps, hand pain, bleeding issues, sudden weight loss, fevers, chills, recent vaccinations, antibiotic usage, sore throat, throat pain, runny nose, other acute skin rashes, unusual food intake, SOB, chest pain, changes in breathing, testicular pain/swelling, and any other symptoms.  INTERVAL HISTORY  .I connected with Orland Penman on 12/05/20 at  9:20 AM EDT by telephone visit and verified that I am speaking with the correct person using two identifiers.   I discussed the limitations, risks, security and privacy concerns of performing an evaluation and management service by telemedicine and the availability of in-person appointments. I also discussed with the patient that there may be  a patient responsible charge related to this service. The patient expressed understanding and agreed to proceed.   Patient's location: home  Provider's location: [facility].   Douglas [name] is a wonderful 80 y.o. male who is here today for evaluation and management of thrombocytopenia. The patient's last visit with [place] was on 10/17/2020. The pt reports that he is doing well overall.  The pt reports he has been doing well he continues to be on tapering doses of  prednisone and is on 5 mg every other day at this time.  He will be done with his taper in the next couple of weeks.  No bleeding or excessive bruising issues.  Lab results today 11/28/2020 of CBC w/diff shows normal platelets at 297k.  Other blood counts unremarkable.  Blood chemistries stable and no significant changes.  On review of systems, pt reports no acute new symptoms.   MEDICAL HISTORY:  Past Medical History:  Diagnosis Date   Allergy    seasonal   Anemia    Bilateral inguinal hernia    Chronic kidney disease    h/o- renal calculi- passed spont. , 10 episodes over the course of 20 yrs.    GERD (gastroesophageal reflux disease)    Hyperlipidemia    Hypertension    Hypothyroidism    Osteoporosis    Sleep apnea    CPAP q night - study done 8-10 yrs. ago    Testosterone deficiency    injections q 3 week- July 8- last injection    Thyroid disease     SURGICAL HISTORY: Past Surgical History:  Procedure Laterality Date   ANAL FISSURE REPAIR     CHOLECYSTECTOMY     COLONOSCOPY     FIBULA FRACTURE SURGERY     fibula and tibia fracture    FRACTURE SURGERY     L leg -plate & screws - ORIF- fibula   HEMORRHOID SURGERY     HERNIA REPAIR  04/19/11   bilateral   LAPAROSCOPIC INGUINAL HERNIA REPAIR  04/19/11   bilateral   LAPAROSCOPY  10/11/2011   Procedure: LAPAROSCOPY DIAGNOSTIC;  Surgeon: Adin Hector, MD;  Location: [place];  Service: General;  Laterality: N/A;  UMBILICAL EXPLORATION   MASS EXCISION  10/11/2011   Procedure: EXCISION MASS;  Surgeon: Adin Hector, MD;  Location: [place];  Service: General;  Laterality: N/A;  EXCISION OF UMBILICAL MASS   POLYPECTOMY      SOCIAL HISTORY: Social History   Socioeconomic History   Marital status: Married    Spouse name: Not on file   Number of children: Not on file   Years of education: Not on file   Highest education level: Not on file  Occupational History   Not on file  Tobacco Use   Smoking status: Former     Types: Cigarettes    Quit date: 06/05/1998    Years since quitting: 22.5   Smokeless tobacco: Never  Vaping Use   Vaping Use: Never used  Substance and Sexual Activity   Alcohol use: No   Drug use: No   Sexual activity: Not on file  Other Topics Concern   Not on file  Social History Narrative   Not on file   Social Determinants of Health   Financial Resource Strain: Not on file  Food Insecurity: Not on file  Transportation Needs: Not on file  Physical Activity: Not on file  Stress: Not on file  Social Connections: Not on file  Intimate Partner  Violence: Not on file    FAMILY HISTORY: Family History  Problem Relation Age of Onset   Stroke Mother    Stroke Father    Colon cancer Neg Hx    Rectal cancer Neg Hx    Stomach cancer Neg Hx    Esophageal cancer Neg Hx    Colon polyps Neg Hx    Allergic rhinitis Neg Hx    Asthma Neg Hx    Angioedema Neg Hx    Atopy Neg Hx    Eczema Neg Hx    Immunodeficiency Neg Hx    Urticaria Neg Hx     ALLERGIES:  is allergic to alpha-gal.  MEDICATIONS:  Current Outpatient Medications  Medication Sig Dispense Refill   Calcium Carbonate-Vitamin D (CALCIUM-VITAMIN D) 600-125 MG-UNIT TABS Take by mouth daily.     cetirizine (ZYRTEC) 5 MG tablet Take 10 mg by mouth daily.      cholecalciferol (VITAMIN D) 1000 UNITS tablet Take 1,000 Units by mouth 2 (two) times daily.     denosumab (PROLIA) 60 MG/ML SOSY injection      EPINEPHrine (EPIPEN 2-PAK) 0.3 mg/0.3 mL IJ SOAJ injection Inject 0.3 mg into the muscle as needed for anaphylaxis.     famotidine (PEPCID) 20 MG tablet Take 1 tablet (20 mg total) by mouth 2 (two) times daily. 180 tablet 1   fish oil-omega-3 fatty acids 1000 MG capsule Take 1 g by mouth 4 (four) times daily.      levothyroxine (SYNTHROID) 125 MCG tablet Take 125 mcg by mouth every morning.     levothyroxine (SYNTHROID, LEVOTHROID) 112 MCG tablet Take 112 mcg by mouth daily.     metroNIDAZOLE (METROGEL) 0.75 % gel Apply 1  application topically 2 (two) times daily.     Multiple Vitamin (MULTIVITAMIN PO) Take by mouth daily.       OVER THE COUNTER MEDICATION Magnesium '500mg'$  po daily     pantoprazole (PROTONIX) 40 MG tablet Take 40 mg by mouth daily.     predniSONE (DELTASONE) 5 MG tablet Take 1 tablet (5 mg total) by mouth daily with breakfast. 60 tablet 0   simvastatin (ZOCOR) 40 MG tablet Take 40 mg by mouth every evening.     telmisartan (MICARDIS) 80 MG tablet Take 80 mg by mouth daily.     testosterone cypionate (DEPOTESTOTERONE CYPIONATE) 100 MG/ML injection Inject into the muscle every 21 ( twenty-one) days. For IM use only     No current facility-administered medications for this visit.    REVIEW OF SYSTEMS:   .10 Point review of Systems was done is negative except as noted above.'  PHYSICAL EXAMINATION:  Telemedicine visit  LABORATORY DATA:  I have reviewed the data as listed  CBC Latest Ref Rng & Units 11/28/2020 10/17/2020 09/19/2020  WBC 4.0 - 10.5 K/uL 8.0 7.0 7.4  Hemoglobin 13.0 - 17.0 g/dL 13.5 12.8(L) 13.6  Hematocrit 39.0 - 52.0 % 43.5 39.8 43.2  Platelets 150 - 400 K/uL 297 282 319    CMP Latest Ref Rng & Units 11/28/2020 10/17/2020 09/19/2020  Glucose 70 - 99 mg/dL 109(H) 110(H) 106(H)  BUN 8 - 23 mg/dL '12 15 16  '$ Creatinine 0.61 - 1.24 mg/dL 1.27(H) 1.26(H) 1.45(H)  Sodium 135 - 145 mmol/L 140 141 140  Potassium 3.5 - 5.1 mmol/L 5.0 4.3 4.8  Chloride 98 - 111 mmol/L 104 106 104  CO2 22 - 32 mmol/L '28 28 27  '$ Calcium 8.9 - 10.3 mg/dL 9.1 9.1 9.1  Total Protein  6.5 - 8.1 g/dL 7.0 6.6 6.9  Total Bilirubin 0.3 - 1.2 mg/dL 0.6 0.5 0.6  Alkaline Phos 38 - 126 U/L 63 57 63  AST 15 - 41 U/L '22 23 27  '$ ALT 0 - 44 U/L '25 28 26   '$ Component     Latest Ref Rng & Units 06/21/2020  Folate, Hemolysate     Not Estab. ng/mL >620.0  HCT     37.5 - 51.0 % 46.8  Folate, RBC     >498 ng/mL >1,325  Prothrombin Time     11.4 - 15.2 seconds 13.0  INR     0.8 - 1.2 1.0  LDH     98 - 192 U/L 254  (H)  Immature Platelet Fraction     1.2 - 8.6 % 9.7 (H)  ABO/RH(D)      O POS . . .  HCV Ab     NON REACTIVE NON REACTIVE  Platelet CT in Citrate      EDTA platelet count consistent with citrate.  Fibrinogen     210 - 475 mg/dL 354  APTT     24 - 36 seconds 28  Haptoglobin     34 - 355 mg/dL 141  Vitamin B12     180 - 914 pg/mL 458    RADIOGRAPHIC STUDIES: I have personally reviewed the radiological images as listed and agreed with the findings in the report. No results found.  ASSESSMENT & PLAN:   80 y.o. with   1) Isolated thrombocytopenia Likely acute ITP  PLAN: -Discussed pt labwork today, 11/28/2020; platelets are completely normal at 297k. -He continues to taper down his prednisone and his platelets are still remaining normal without any overt evidence of relapse of his acute ITP. -He shall reduce the prednisone to 5 mg every other day for 2 more weeks and then stop the prednisone. -Continue to start taking a Vitamin B-Complex daily.  -Continue Multivitamin daily.  FOLLOW UP: Phone visit with Dr Irene Limbo in 8 weeks Labs 1 day prior to phone visit     All of the patients questions were answered with apparent satisfaction. The patient knows to call the clinic with any problems, questions or concerns.  . The total time spent in the appointment was 15 minutes and more than 50% was on counseling and direct patient cares.    Sullivan Lone MD [place] AAHIVMS [facility] Hematology/Oncology Physician [facility]

## 2020-12-06 ENCOUNTER — Encounter: Payer: Self-pay | Admitting: Hematology

## 2020-12-06 DIAGNOSIS — R531 Weakness: Secondary | ICD-10-CM | POA: Diagnosis not present

## 2020-12-06 DIAGNOSIS — M542 Cervicalgia: Secondary | ICD-10-CM | POA: Diagnosis not present

## 2020-12-13 DIAGNOSIS — R531 Weakness: Secondary | ICD-10-CM | POA: Diagnosis not present

## 2020-12-13 DIAGNOSIS — M542 Cervicalgia: Secondary | ICD-10-CM | POA: Diagnosis not present

## 2020-12-20 DIAGNOSIS — M542 Cervicalgia: Secondary | ICD-10-CM | POA: Diagnosis not present

## 2020-12-20 DIAGNOSIS — R531 Weakness: Secondary | ICD-10-CM | POA: Diagnosis not present

## 2020-12-26 DIAGNOSIS — M542 Cervicalgia: Secondary | ICD-10-CM | POA: Diagnosis not present

## 2020-12-26 DIAGNOSIS — R531 Weakness: Secondary | ICD-10-CM | POA: Diagnosis not present

## 2021-01-05 DIAGNOSIS — E291 Testicular hypofunction: Secondary | ICD-10-CM | POA: Diagnosis not present

## 2021-01-05 DIAGNOSIS — E559 Vitamin D deficiency, unspecified: Secondary | ICD-10-CM | POA: Diagnosis not present

## 2021-01-05 DIAGNOSIS — Z125 Encounter for screening for malignant neoplasm of prostate: Secondary | ICD-10-CM | POA: Diagnosis not present

## 2021-01-05 DIAGNOSIS — E039 Hypothyroidism, unspecified: Secondary | ICD-10-CM | POA: Diagnosis not present

## 2021-01-05 DIAGNOSIS — E785 Hyperlipidemia, unspecified: Secondary | ICD-10-CM | POA: Diagnosis not present

## 2021-01-05 DIAGNOSIS — E1169 Type 2 diabetes mellitus with other specified complication: Secondary | ICD-10-CM | POA: Diagnosis not present

## 2021-01-10 DIAGNOSIS — M542 Cervicalgia: Secondary | ICD-10-CM | POA: Diagnosis not present

## 2021-01-10 DIAGNOSIS — R531 Weakness: Secondary | ICD-10-CM | POA: Diagnosis not present

## 2021-01-12 DIAGNOSIS — J8410 Pulmonary fibrosis, unspecified: Secondary | ICD-10-CM | POA: Diagnosis not present

## 2021-01-12 DIAGNOSIS — I129 Hypertensive chronic kidney disease with stage 1 through stage 4 chronic kidney disease, or unspecified chronic kidney disease: Secondary | ICD-10-CM | POA: Diagnosis not present

## 2021-01-12 DIAGNOSIS — Z0000 Encounter for general adult medical examination without abnormal findings: Secondary | ICD-10-CM | POA: Diagnosis not present

## 2021-01-12 DIAGNOSIS — N1831 Chronic kidney disease, stage 3a: Secondary | ICD-10-CM | POA: Diagnosis not present

## 2021-01-12 DIAGNOSIS — Z1212 Encounter for screening for malignant neoplasm of rectum: Secondary | ICD-10-CM | POA: Diagnosis not present

## 2021-01-12 DIAGNOSIS — Z889 Allergy status to unspecified drugs, medicaments and biological substances status: Secondary | ICD-10-CM | POA: Diagnosis not present

## 2021-01-12 DIAGNOSIS — E785 Hyperlipidemia, unspecified: Secondary | ICD-10-CM | POA: Diagnosis not present

## 2021-01-12 DIAGNOSIS — M810 Age-related osteoporosis without current pathological fracture: Secondary | ICD-10-CM | POA: Diagnosis not present

## 2021-01-12 DIAGNOSIS — G4733 Obstructive sleep apnea (adult) (pediatric): Secondary | ICD-10-CM | POA: Diagnosis not present

## 2021-01-12 DIAGNOSIS — D696 Thrombocytopenia, unspecified: Secondary | ICD-10-CM | POA: Diagnosis not present

## 2021-01-12 DIAGNOSIS — E039 Hypothyroidism, unspecified: Secondary | ICD-10-CM | POA: Diagnosis not present

## 2021-01-12 DIAGNOSIS — R82998 Other abnormal findings in urine: Secondary | ICD-10-CM | POA: Diagnosis not present

## 2021-01-12 DIAGNOSIS — Z23 Encounter for immunization: Secondary | ICD-10-CM | POA: Diagnosis not present

## 2021-01-12 DIAGNOSIS — E1169 Type 2 diabetes mellitus with other specified complication: Secondary | ICD-10-CM | POA: Diagnosis not present

## 2021-01-15 DIAGNOSIS — H2513 Age-related nuclear cataract, bilateral: Secondary | ICD-10-CM | POA: Diagnosis not present

## 2021-01-15 DIAGNOSIS — H40023 Open angle with borderline findings, high risk, bilateral: Secondary | ICD-10-CM | POA: Diagnosis not present

## 2021-01-15 DIAGNOSIS — H5213 Myopia, bilateral: Secondary | ICD-10-CM | POA: Diagnosis not present

## 2021-01-15 DIAGNOSIS — H21233 Degeneration of iris (pigmentary), bilateral: Secondary | ICD-10-CM | POA: Diagnosis not present

## 2021-01-16 DIAGNOSIS — M542 Cervicalgia: Secondary | ICD-10-CM | POA: Diagnosis not present

## 2021-01-16 DIAGNOSIS — R531 Weakness: Secondary | ICD-10-CM | POA: Diagnosis not present

## 2021-01-22 DIAGNOSIS — N1831 Chronic kidney disease, stage 3a: Secondary | ICD-10-CM | POA: Diagnosis not present

## 2021-01-22 DIAGNOSIS — M810 Age-related osteoporosis without current pathological fracture: Secondary | ICD-10-CM | POA: Diagnosis not present

## 2021-01-22 DIAGNOSIS — E785 Hyperlipidemia, unspecified: Secondary | ICD-10-CM | POA: Diagnosis not present

## 2021-01-22 DIAGNOSIS — I129 Hypertensive chronic kidney disease with stage 1 through stage 4 chronic kidney disease, or unspecified chronic kidney disease: Secondary | ICD-10-CM | POA: Diagnosis not present

## 2021-01-22 DIAGNOSIS — E1122 Type 2 diabetes mellitus with diabetic chronic kidney disease: Secondary | ICD-10-CM | POA: Diagnosis not present

## 2021-01-23 ENCOUNTER — Other Ambulatory Visit: Payer: Medicare Other

## 2021-01-23 DIAGNOSIS — R918 Other nonspecific abnormal finding of lung field: Secondary | ICD-10-CM | POA: Diagnosis not present

## 2021-01-23 DIAGNOSIS — I700 Atherosclerosis of aorta: Secondary | ICD-10-CM | POA: Diagnosis not present

## 2021-01-23 DIAGNOSIS — J439 Emphysema, unspecified: Secondary | ICD-10-CM | POA: Diagnosis not present

## 2021-01-23 DIAGNOSIS — J984 Other disorders of lung: Secondary | ICD-10-CM | POA: Diagnosis not present

## 2021-01-23 DIAGNOSIS — K449 Diaphragmatic hernia without obstruction or gangrene: Secondary | ICD-10-CM | POA: Diagnosis not present

## 2021-01-24 ENCOUNTER — Ambulatory Visit: Payer: Medicare Other | Admitting: Hematology

## 2021-01-29 DIAGNOSIS — M542 Cervicalgia: Secondary | ICD-10-CM | POA: Diagnosis not present

## 2021-01-29 DIAGNOSIS — R531 Weakness: Secondary | ICD-10-CM | POA: Diagnosis not present

## 2021-02-05 DIAGNOSIS — H2513 Age-related nuclear cataract, bilateral: Secondary | ICD-10-CM | POA: Diagnosis not present

## 2021-02-05 DIAGNOSIS — H401311 Pigmentary glaucoma, right eye, mild stage: Secondary | ICD-10-CM | POA: Diagnosis not present

## 2021-02-05 DIAGNOSIS — H21233 Degeneration of iris (pigmentary), bilateral: Secondary | ICD-10-CM | POA: Diagnosis not present

## 2021-02-06 DIAGNOSIS — R911 Solitary pulmonary nodule: Secondary | ICD-10-CM | POA: Diagnosis not present

## 2021-02-06 DIAGNOSIS — J432 Centrilobular emphysema: Secondary | ICD-10-CM | POA: Diagnosis not present

## 2021-02-12 DIAGNOSIS — M542 Cervicalgia: Secondary | ICD-10-CM | POA: Diagnosis not present

## 2021-02-12 DIAGNOSIS — R531 Weakness: Secondary | ICD-10-CM | POA: Diagnosis not present

## 2021-02-14 ENCOUNTER — Other Ambulatory Visit (HOSPITAL_COMMUNITY): Payer: Self-pay | Admitting: *Deleted

## 2021-02-16 ENCOUNTER — Ambulatory Visit (HOSPITAL_COMMUNITY)
Admission: RE | Admit: 2021-02-16 | Discharge: 2021-02-16 | Disposition: A | Discharge status: Home / Self Care | Payer: Medicare Other | Source: Ambulatory Visit | Attending: Internal Medicine | Admitting: Internal Medicine

## 2021-02-16 ENCOUNTER — Other Ambulatory Visit: Payer: Self-pay

## 2021-02-16 DIAGNOSIS — M810 Age-related osteoporosis without current pathological fracture: Secondary | ICD-10-CM | POA: Diagnosis not present

## 2021-02-16 MED ORDER — DENOSUMAB 60 MG/ML ~~LOC~~ SOSY
60.0000 mg | PREFILLED_SYRINGE | Freq: Once | SUBCUTANEOUS | Status: AC
  Administered 2021-02-16: 60 mg via SUBCUTANEOUS

## 2021-02-27 DIAGNOSIS — R531 Weakness: Secondary | ICD-10-CM | POA: Diagnosis not present

## 2021-02-27 DIAGNOSIS — M542 Cervicalgia: Secondary | ICD-10-CM | POA: Diagnosis not present

## 2021-02-28 ENCOUNTER — Other Ambulatory Visit: Payer: Self-pay

## 2021-02-28 ENCOUNTER — Inpatient Hospital Stay: Payer: Medicare Other | Attending: Hematology

## 2021-02-28 DIAGNOSIS — D696 Thrombocytopenia, unspecified: Secondary | ICD-10-CM | POA: Diagnosis not present

## 2021-02-28 LAB — CMP (CANCER CENTER ONLY)
ALT: 21 U/L (ref 0–44)
AST: 25 U/L (ref 15–41)
Albumin: 3.6 g/dL (ref 3.5–5.0)
Alkaline Phosphatase: 81 U/L (ref 38–126)
Anion gap: 7 (ref 5–15)
BUN: 14 mg/dL (ref 8–23)
CO2: 26 mmol/L (ref 22–32)
Calcium: 8.9 mg/dL (ref 8.9–10.3)
Chloride: 107 mmol/L (ref 98–111)
Creatinine: 1.42 mg/dL — ABNORMAL HIGH (ref 0.61–1.24)
GFR, Estimated: 51 mL/min — ABNORMAL LOW (ref >60)
Glucose, Bld: 139 mg/dL — ABNORMAL HIGH (ref 70–99)
Potassium: 4.7 mmol/L (ref 3.5–5.1)
Sodium: 140 mmol/L (ref 135–145)
Total Bilirubin: 0.6 mg/dL (ref 0.3–1.2)
Total Protein: 7.5 g/dL (ref 6.5–8.1)

## 2021-02-28 LAB — CBC WITH DIFFERENTIAL (CANCER CENTER ONLY)
Abs Immature Granulocytes: 0.02 10*3/uL (ref 0.00–0.07)
Basophils Absolute: 0.1 10*3/uL (ref 0.0–0.1)
Basophils Relative: 1 %
Eosinophils Absolute: 0.3 10*3/uL (ref 0.0–0.5)
Eosinophils Relative: 3 %
HCT: 43.6 % (ref 39.0–52.0)
Hemoglobin: 13.5 g/dL (ref 13.0–17.0)
Immature Granulocytes: 0 %
Lymphocytes Relative: 15 %
Lymphs Abs: 1.3 10*3/uL (ref 0.7–4.0)
MCH: 25.6 pg — ABNORMAL LOW (ref 26.0–34.0)
MCHC: 31 g/dL (ref 30.0–36.0)
MCV: 82.7 fL (ref 80.0–100.0)
Monocytes Absolute: 0.8 10*3/uL (ref 0.1–1.0)
Monocytes Relative: 9 %
Neutro Abs: 6.1 10*3/uL (ref 1.7–7.7)
Neutrophils Relative %: 72 %
Platelet Count: 355 10*3/uL (ref 150–400)
RBC: 5.27 MIL/uL (ref 4.22–5.81)
RDW: 17 % — ABNORMAL HIGH (ref 11.5–15.5)
WBC Count: 8.5 10*3/uL (ref 4.0–10.5)
nRBC: 0 % (ref 0.0–0.2)

## 2021-02-28 LAB — IMMATURE PLATELET FRACTION: Immature Platelet Fraction: 1.7 % (ref 1.2–8.6)

## 2021-03-01 DIAGNOSIS — H25811 Combined forms of age-related cataract, right eye: Secondary | ICD-10-CM | POA: Diagnosis not present

## 2021-03-01 DIAGNOSIS — H2511 Age-related nuclear cataract, right eye: Secondary | ICD-10-CM | POA: Diagnosis not present

## 2021-03-02 ENCOUNTER — Inpatient Hospital Stay: Payer: Medicare Other

## 2021-03-05 ENCOUNTER — Inpatient Hospital Stay (HOSPITAL_BASED_OUTPATIENT_CLINIC_OR_DEPARTMENT_OTHER): Payer: Medicare Other | Admitting: Hematology

## 2021-03-05 DIAGNOSIS — D693 Immune thrombocytopenic purpura: Secondary | ICD-10-CM | POA: Diagnosis not present

## 2021-03-05 NOTE — Progress Notes (Signed)
HEMATOLOGY/ONCOLOGY CLINIC NOTE  Date of Service: .03/05/2021   Patient Care Team: Crist Infante, MD as PCP - General (Internal Medicine)  CHIEF COMPLAINTS/PURPOSE OF CONSULTATION:  Follow-up for continued management of acute ITP  HISTORY OF PRESENTING ILLNESS:  Please see previous notes for details of initial presentation  INTERVAL HISTORY .I connected with Douglas [name] on 03/05/21 at  2:00 PM EST by telephone visit and verified that I am speaking with the correct person using two identifiers.   I discussed the limitations, risks, security and privacy concerns of performing an evaluation and management service by telemedicine and the availability of in-person appointments. I also discussed with the patient that there may be a patient responsible charge related to this service. The patient expressed understanding and agreed to proceed.   Other persons participating in the visit and their role in the encounter: None  Patient's location: Home Provider's location: [place]  Chief Complaint: Lab review for acute ITP.  Patient notes no acute new symptoms since his last clinic visit about 3 months ago on 11/29/2020.  No bleeding issues.  No abnormal bruising. No black stools no blood in the stools.  No hematuria. No epistaxis.  Patient notes no new medications. He completed his prednisone taper about 2 months ago.  His labs done 02/28/2021 were reviewed with him in detail.  Platelet counts have remained normal at 355k, hemoglobin normal at 13.5, WBC count 8.5k CMP stable with chronic kidney disease creatinine 1.42.   MEDICAL HISTORY:  Past Medical History:  Diagnosis Date   Allergy    seasonal   Anemia    Bilateral inguinal hernia    Chronic kidney disease    h/o- renal calculi- passed spont. , 10 episodes over the course of 20 yrs.    GERD (gastroesophageal reflux disease)    Hyperlipidemia    Hypertension    Hypothyroidism    Osteoporosis    Sleep  apnea    CPAP q night - study done 8-10 yrs. ago    Testosterone deficiency    injections q 3 week- July 8- last injection    Thyroid disease     SURGICAL HISTORY: Past Surgical History:  Procedure Laterality Date   ANAL FISSURE REPAIR     CHOLECYSTECTOMY     COLONOSCOPY     FIBULA FRACTURE SURGERY     fibula and tibia fracture    FRACTURE SURGERY     L leg -plate & screws - ORIF- fibula   HEMORRHOID SURGERY     HERNIA REPAIR  04/19/11   bilateral   LAPAROSCOPIC INGUINAL HERNIA REPAIR  04/19/11   bilateral   LAPAROSCOPY  10/11/2011   Procedure: LAPAROSCOPY DIAGNOSTIC;  Surgeon: Adin Hector, MD;  Location: [place];  Service: General;  Laterality: N/A;  UMBILICAL EXPLORATION   MASS EXCISION  10/11/2011   Procedure: EXCISION MASS;  Surgeon: Adin Hector, MD;  Location: [place];  Service: General;  Laterality: N/A;  EXCISION OF UMBILICAL MASS   POLYPECTOMY      SOCIAL HISTORY: Social History   Socioeconomic History   Marital status: Married    Spouse name: Not on file   Number of children: Not on file   Years of education: Not on file   Highest education level: Not on file  Occupational History   Not on file  Tobacco Use   Smoking status: Former    Types: Cigarettes    Quit date: 06/05/1998    Years  since quitting: 22.7   Smokeless tobacco: Never  Vaping Use   Vaping Use: Never used  Substance and Sexual Activity   Alcohol use: No   Drug use: No   Sexual activity: Not on file  Other Topics Concern   Not on file  Social History Narrative   Not on file   Social Determinants of Health   Financial Resource Strain: Not on file  Food Insecurity: Not on file  Transportation Needs: Not on file  Physical Activity: Not on file  Stress: Not on file  Social Connections: Not on file  Intimate Partner Violence: Not on file    FAMILY HISTORY: Family History  Problem Relation Age of Onset   Stroke Mother    Stroke Father    Colon cancer Neg Hx    Rectal cancer Neg  Hx    Stomach cancer Neg Hx    Esophageal cancer Neg Hx    Colon polyps Neg Hx    Allergic rhinitis Neg Hx    Asthma Neg Hx    Angioedema Neg Hx    Atopy Neg Hx    Eczema Neg Hx    Immunodeficiency Neg Hx    Urticaria Neg Hx     ALLERGIES:  is allergic to alpha-gal.  MEDICATIONS:  Current Outpatient Medications  Medication Sig Dispense Refill   Calcium Carbonate-Vitamin D (CALCIUM-VITAMIN D) 600-125 MG-UNIT TABS Take by mouth daily.     cetirizine (ZYRTEC) 5 MG tablet Take 10 mg by mouth daily.      cholecalciferol (VITAMIN D) 1000 UNITS tablet Take 1,000 Units by mouth 2 (two) times daily.     denosumab (PROLIA) 60 MG/ML SOSY injection      EPINEPHrine (EPIPEN 2-PAK) 0.3 mg/0.3 mL IJ SOAJ injection Inject 0.3 mg into the muscle as needed for anaphylaxis.     famotidine (PEPCID) 20 MG tablet Take 1 tablet (20 mg total) by mouth 2 (two) times daily. 180 tablet 1   fish oil-omega-3 fatty acids 1000 MG capsule Take 1 g by mouth 4 (four) times daily.      levothyroxine (SYNTHROID) 125 MCG tablet Take 125 mcg by mouth every morning.     levothyroxine (SYNTHROID, LEVOTHROID) 112 MCG tablet Take 112 mcg by mouth daily.     metroNIDAZOLE (METROGEL) 0.75 % gel Apply 1 application topically 2 (two) times daily.     Multiple Vitamin (MULTIVITAMIN PO) Take by mouth daily.       OVER THE COUNTER MEDICATION Magnesium 500mg  po daily     pantoprazole (PROTONIX) 40 MG tablet Take 40 mg by mouth daily.     predniSONE (DELTASONE) 5 MG tablet Take 1 tablet (5 mg total) by mouth daily with breakfast. 60 tablet 0   simvastatin (ZOCOR) 40 MG tablet Take 40 mg by mouth every evening.     telmisartan (MICARDIS) 80 MG tablet Take 80 mg by mouth daily.     testosterone cypionate (DEPOTESTOTERONE CYPIONATE) 100 MG/ML injection Inject into the muscle every 21 ( twenty-one) days. For IM use only     No current facility-administered medications for this visit.    REVIEW OF SYSTEMS:   .10 Point review of  Systems was done is negative except as noted above.   PHYSICAL EXAMINATION:  Telemedicine visit  LABORATORY DATA:  I have reviewed the data as listed  CBC Latest Ref Rng & Units 02/28/2021 11/28/2020 10/17/2020  WBC 4.0 - 10.5 K/uL 8.5 8.0 7.0  Hemoglobin 13.0 - 17.0 g/dL 13.5 13.5 12.8(L)  Hematocrit 39.0 - 52.0 % 43.6 43.5 39.8  Platelets 150 - 400 K/uL 355 297 282    CMP Latest Ref Rng & Units 02/28/2021 11/28/2020 10/17/2020  Glucose 70 - 99 mg/dL 139(H) 109(H) 110(H)  BUN 8 - 23 mg/dL 14 12 15   Creatinine 0.61 - 1.24 mg/dL 1.42(H) 1.27(H) 1.26(H)  Sodium 135 - 145 mmol/L 140 140 141  Potassium 3.5 - 5.1 mmol/L 4.7 5.0 4.3  Chloride 98 - 111 mmol/L 107 104 106  CO2 22 - 32 mmol/L 26 28 28   Calcium 8.9 - 10.3 mg/dL 8.9 9.1 9.1  Total Protein 6.5 - 8.1 g/dL 7.5 7.0 6.6  Total Bilirubin 0.3 - 1.2 mg/dL 0.6 0.6 0.5  Alkaline Phos 38 - 126 U/L 81 63 57  AST 15 - 41 U/L 25 22 23   ALT 0 - 44 U/L 21 25 28    Component     Latest Ref Rng & Units 06/21/2020  Folate, Hemolysate     Not Estab. ng/mL >620.0  HCT     37.5 - 51.0 % 46.8  Folate, RBC     >498 ng/mL >1,325  Prothrombin Time     11.4 - 15.2 seconds 13.0  INR     0.8 - 1.2 1.0  LDH     98 - 192 U/L 254 (H)  Immature Platelet Fraction     1.2 - 8.6 % 9.7 (H)  ABO/RH(D)      O POS . . .  HCV Ab     NON REACTIVE NON REACTIVE  Platelet CT in Citrate      EDTA platelet count consistent with citrate.  Fibrinogen     210 - 475 mg/dL 354  APTT     24 - 36 seconds 28  Haptoglobin     34 - 355 mg/dL 141  Vitamin B12     180 - 914 pg/mL 458    RADIOGRAPHIC STUDIES: I have personally reviewed the radiological images as listed and agreed with the findings in the report. No results found.  ASSESSMENT & PLAN:   80 y.o. with   1) acute ITP diagnosed in March 2022. Went into remission with high-dose steroid followed by taper. PLAN: -Discussed labs done 02/28/2021.  Patient continues to remain in remission from his  acute ITP with platelet count of 355k -He has been off prednisone for about 2 months and continues to remain in remission. -Continue vitamin B complex 1 capsule p.o. daily. -Patient prefers to continue monitoring labs with his primary care physician. -I recommended he follow-up with lab monitoring with Dr. Joylene Draft to monitor his platelet counts every 3 to 4 months for the first year and then 6 months from the next year -Please reconsult [place] if patient has a relapse of his ITP and platelet counts dropped to below 100 k  FOLLOW UP: Continue f/u with Dr Joylene Draft   All of the patients questions were answered with apparent satisfaction. The patient knows to call the clinic with any problems, questions or concerns.  Total time spent 15 mins discussing lab results and developing a plan for follow-up.  Sullivan Lone MD [name] AAHIVMS [employer] [facility] Hematology/Oncology Physician [facility]

## 2021-03-08 DIAGNOSIS — H25812 Combined forms of age-related cataract, left eye: Secondary | ICD-10-CM | POA: Diagnosis not present

## 2021-03-08 DIAGNOSIS — H2512 Age-related nuclear cataract, left eye: Secondary | ICD-10-CM | POA: Diagnosis not present

## 2021-03-25 DIAGNOSIS — Z20822 Contact with and (suspected) exposure to covid-19: Secondary | ICD-10-CM | POA: Diagnosis not present

## 2021-03-26 DIAGNOSIS — Z23 Encounter for immunization: Secondary | ICD-10-CM | POA: Diagnosis not present

## 2021-04-12 DIAGNOSIS — Z23 Encounter for immunization: Secondary | ICD-10-CM | POA: Diagnosis not present

## 2021-04-12 DIAGNOSIS — E291 Testicular hypofunction: Secondary | ICD-10-CM | POA: Diagnosis not present

## 2021-04-12 DIAGNOSIS — D649 Anemia, unspecified: Secondary | ICD-10-CM | POA: Diagnosis not present

## 2021-04-12 DIAGNOSIS — D7589 Other specified diseases of blood and blood-forming organs: Secondary | ICD-10-CM | POA: Diagnosis not present

## 2021-04-12 DIAGNOSIS — R7989 Other specified abnormal findings of blood chemistry: Secondary | ICD-10-CM | POA: Diagnosis not present

## 2021-04-16 DIAGNOSIS — D7589 Other specified diseases of blood and blood-forming organs: Secondary | ICD-10-CM | POA: Diagnosis not present

## 2021-04-16 DIAGNOSIS — D696 Thrombocytopenia, unspecified: Secondary | ICD-10-CM | POA: Diagnosis not present

## 2021-04-16 DIAGNOSIS — R7989 Other specified abnormal findings of blood chemistry: Secondary | ICD-10-CM | POA: Diagnosis not present

## 2021-04-16 DIAGNOSIS — D649 Anemia, unspecified: Secondary | ICD-10-CM | POA: Diagnosis not present

## 2021-04-22 DIAGNOSIS — M810 Age-related osteoporosis without current pathological fracture: Secondary | ICD-10-CM | POA: Diagnosis not present

## 2021-04-22 DIAGNOSIS — I10 Essential (primary) hypertension: Secondary | ICD-10-CM | POA: Diagnosis not present

## 2021-04-22 DIAGNOSIS — E785 Hyperlipidemia, unspecified: Secondary | ICD-10-CM | POA: Diagnosis not present

## 2021-04-22 DIAGNOSIS — E039 Hypothyroidism, unspecified: Secondary | ICD-10-CM | POA: Diagnosis not present

## 2021-04-25 DIAGNOSIS — Z1212 Encounter for screening for malignant neoplasm of rectum: Secondary | ICD-10-CM | POA: Diagnosis not present

## 2021-05-02 ENCOUNTER — Encounter: Payer: Self-pay | Admitting: Physician Assistant

## 2021-05-02 ENCOUNTER — Ambulatory Visit (INDEPENDENT_AMBULATORY_CARE_PROVIDER_SITE_OTHER): Payer: Medicare Other | Admitting: Physician Assistant

## 2021-05-02 VITALS — BP 140/70 | HR 61 | Ht 70.0 in | Wt 190.4 lb

## 2021-05-02 DIAGNOSIS — J8410 Pulmonary fibrosis, unspecified: Secondary | ICD-10-CM | POA: Diagnosis not present

## 2021-05-02 DIAGNOSIS — D509 Iron deficiency anemia, unspecified: Secondary | ICD-10-CM | POA: Diagnosis not present

## 2021-05-02 DIAGNOSIS — K219 Gastro-esophageal reflux disease without esophagitis: Secondary | ICD-10-CM

## 2021-05-02 DIAGNOSIS — D696 Thrombocytopenia, unspecified: Secondary | ICD-10-CM

## 2021-05-02 DIAGNOSIS — G4733 Obstructive sleep apnea (adult) (pediatric): Secondary | ICD-10-CM | POA: Diagnosis not present

## 2021-05-02 DIAGNOSIS — Z862 Personal history of diseases of the blood and blood-forming organs and certain disorders involving the immune mechanism: Secondary | ICD-10-CM | POA: Diagnosis not present

## 2021-05-02 MED ORDER — NA SULFATE-K SULFATE-MG SULF 17.5-3.13-1.6 GM/177ML PO SOLN: 1.0000 | ORAL | 0 refills | Status: DC

## 2021-05-02 NOTE — Progress Notes (Signed)
05/02/2021 Douglas [name] [id number] May 14, 1940   ASSESSMENT AND PLAN:    Iron deficiency anemia, unspecified iron deficiency anemia type HGB 13.5 MCV 82.7  low ferritin, low normal iron, Hemoccult negative. Recent prednisone use, GERD symptoms, last colonoscopy 2016 with recall 5 years for due to sessile polyp I do believe it is possible that the hemoglobin is falsely elevated due to chronic testosterone use and sleep apnea so he could have true anemia, that coupled with low ferritin in this patient that is fairly healthy for his age, I do think it is reasonable to do  diagnostic colonoscopy and endoscopy. We discussed risk and benefits of the procedures including bleeding, infection, perforation, medication reactions, anesthesia issues All questions were answered and the patient acknowledges these risk and wishes to proceed.  Gastroesophageal reflux disease, unspecified whether esophagitis present Lifestyle changes discuss, avoid NSAIDS Continue current medications  Interstitial pulmonary fibrosis (HCC) No O2 requirement, does well.   History of ITP Improved after high dose steroids, Dr. Irene Limbo.  Platelets 355  OSA (obstructive sleep apnea) Compliant with CPAP   Future Appointments  Date Time Provider [place]  05/02/2021  1:30 PM Vladimir Crofts, PA-C [facility]    Patient Care Team: Crist Infante, MD as PCP - General (Internal Medicine)  HISTORY OF PRESENT ILLNESS: 81 y.o. male referred by Crist Infante, MD presents for evaluation of IDA.  He has medical history significant for Chronic kidney disease stage III, osteoporosis on Prolia, history of abdominal hernia, hyperlipidemia, reflux, interstitial pulmonary fibrosis no hypoxia, sleep apnea on CPAP, thrombocytopenia, type 2 diabetes, anemia.  Patient follows with Dr. Irene Limbo, last visit 03/05/21 for acute ITP that was diagnosed in March, reversed with high dose prednisone which patient 3 months  ago. Patient is on testosterone for years, 150 mg every 3 weeks.  He had a high HGB 5-6 years ago, told to start donate blood which he was doing prior to [place]. Referred here today due to microcytosis with normal HGB, normal iron, ferritin is low.  He is on protonix daily, rare GERD in last 2-3 weeks but has been severe, pepcid fixes it.  IE this AM was drinking soda and had severe diffuse burning chest discomfort.  No exertional CP, SOB, dizziness.  No blood thinners, no NSAIDS, no ETOH.  No dysphagia, N/V, AB pain.  Has BM once a day, no melena or hematochezia.  Has had some darker stools on the iron since 01/19.   Reviewing records from [facility] [place] show  04/25/2021 Hemoccult negative. WBC 6.7, platelets 181, hemoglobin 13.4, MCV 79.2 Looking back through the records patient has not had anemia based off hemoglobin going as far back as 2019 04/12/2021 iron 73, ferritin 17, iron saturation 21, transferrin 302- started on ferrous sulfate once day.  Hemosure - 01/12/2021  Colonoscopy with Fuller Plan 09/08/14  1. Sessile polyp in the descending colon; polypectomy performed with a cold snare 2. Grade l internal hemorrhoids Recall 5 years  EGD with Dr. Fuller Plan for GERD 12/2016 - Benign-appearing esophageal stenosis. Dilated. - Medium-sized hiatal hernia. - Normal duodenal bulb and second portion of the duodenum. - No specimens collected.  Current Medications:   Current Outpatient Medications (Endocrine & Metabolic):    denosumab (PROLIA) 60 MG/ML SOSY injection,    levothyroxine (SYNTHROID) 125 MCG tablet, Take 125 mcg by mouth every morning.   levothyroxine (SYNTHROID, LEVOTHROID) 112 MCG tablet, Take 112 mcg by mouth daily.   predniSONE (DELTASONE) 5 MG tablet, Take 1  tablet (5 mg total) by mouth daily with breakfast.   testosterone cypionate (DEPOTESTOTERONE CYPIONATE) 100 MG/ML injection, Inject into the muscle every 21 ( twenty-one) days. For IM use only  Current  Outpatient Medications (Cardiovascular):    EPINEPHrine (EPIPEN 2-PAK) 0.3 mg/0.3 mL IJ SOAJ injection, Inject 0.3 mg into the muscle as needed for anaphylaxis.   simvastatin (ZOCOR) 40 MG tablet, Take 40 mg by mouth every evening.   telmisartan (MICARDIS) 80 MG tablet, Take 80 mg by mouth daily.  Current Outpatient Medications (Respiratory):    cetirizine (ZYRTEC) 5 MG tablet, Take 10 mg by mouth daily.     Current Outpatient Medications (Other):    Calcium Carbonate-Vitamin D (CALCIUM-VITAMIN D) 600-125 MG-UNIT TABS, Take by mouth daily.   cholecalciferol (VITAMIN D) 1000 UNITS tablet, Take 1,000 Units by mouth 2 (two) times daily.   famotidine (PEPCID) 20 MG tablet, Take 1 tablet (20 mg total) by mouth 2 (two) times daily.   fish oil-omega-3 fatty acids 1000 MG capsule, Take 1 g by mouth 4 (four) times daily.    metroNIDAZOLE (METROGEL) 0.75 % gel, Apply 1 application topically 2 (two) times daily.   Multiple Vitamin (MULTIVITAMIN PO), Take by mouth daily.     OVER THE COUNTER MEDICATION, Magnesium 500mg  po daily   pantoprazole (PROTONIX) 40 MG tablet, Take 40 mg by mouth daily.  Medical History:  Past Medical History:  Diagnosis Date   Allergy    seasonal   Anemia    Bilateral inguinal hernia    Chronic kidney disease    h/o- renal calculi- passed spont. , 10 episodes over the course of 20 yrs.    GERD (gastroesophageal reflux disease)    Hyperlipidemia    Hypertension    Hypothyroidism    Osteoporosis    Sleep apnea    CPAP q night - study done 8-10 yrs. ago    Testosterone deficiency    injections q 3 week- July 8- last injection    Thyroid disease    Allergies:  Allergies  Allergen Reactions   Alpha-Gal Anaphylaxis    Anaphylaxis (ALLERGY) (Anaphylaxis), Cough (ALLERGY/intolerance), Respiratory Distress (ALLERGY/intolerance), Shortness Of Breath (ALLERGY/intolerance) (Shortness Of Breath), Wheezing (ALLERGY/intolerance)     Surgical History:  He  has a past  surgical history that includes Cholecystectomy; Fibula Fracture Surgery; Hemorrhoid surgery; Anal fissure repair; Laparoscopic inguinal hernia repair (04/19/11); Hernia repair (04/19/11); Fracture surgery; laparoscopy (10/11/2011); Mass excision (10/11/2011); Polypectomy; and Colonoscopy. Family History:  His family history includes Stroke in his father and mother. Social History:   reports that he quit smoking about 22 years ago. He has never used smokeless tobacco. He reports that he does not drink alcohol and does not use drugs.  REVIEW OF SYSTEMS  : All other systems reviewed and negative except where noted in the History of Present Illness.   PHYSICAL EXAM: There were no vitals taken for this visit. General:   Pleasant, well developed male in no acute distress Head:  Normocephalic and atraumatic. Eyes: sclerae anicteric,conjunctive pink  Heart:  regular rate and rhythm, no murmurs or gallops Pulm: Clear anteriorly; no wheezing Abdomen:  Soft, Obese AB, skin exam normal, Normal bowel sounds. mild tenderness in the epigastrium. Without guarding and Without rebound, without hepatomegaly. Extremities:  Without edema. Msk:  Symmetrical without gross deformities. Peripheral pulses intact.  Neurologic:  Alert and  oriented x4;  grossly normal neurologically. Skin:   Dry and intact without significant lesions or rashes. Psychiatric: Demonstrates good judgement and reason without  abnormal affect or behaviors.   Vladimir Crofts, PA-C 11:51 AM

## 2021-05-02 NOTE — Patient Instructions (Signed)
COLONOSCOPY AND ENDOSCOPY:   You have been scheduled for an endoscopy and a colonoscopy. Please follow the written instructions given to you at your visit today.  PREP: Suprep   Please pick up your prep supplies at the pharmacy within the next 1-3 days.  INHALERS:   If you use inhalers (even only as needed), please bring them with you on the day of your procedure.   COLONOSCOPY TIPS:  To reduce nausea and dehydration, stay well hydrated for 3-4 days prior to the exam.  To prevent skin/hemorrhoid irritation - prior to wiping, put A&Dointment or vaseline on the toilet paper. Keep a towel or pad on the bed.  BEFORE STARTING YOUR PREP, drink  64oz of clear liquids in the morning. This will help to flush the colon and will ensure you are well hydrated!!!!  NOTE - This is in addition to the fluids required for to complete your prep. Use of a flavored hard candy, such as grape Anise Salvo, can counteract some of the flavor of the prep and may prevent some nausea.   If you are age 1 or older, your body mass index should be between 23-30. Your Body mass index is 27.32 kg/m. If this is out of the aforementioned range listed, please consider follow up with your Primary Care Provider.  If you are age 65 or younger, your body mass index should be between 19-25. Your Body mass index is 27.32 kg/m. If this is out of the aformentioned range listed, please consider follow up with your Primary Care Provider.   ________________________________________________________  The [facility] providers would like to encourage you to use [facility] to communicate with providers for non-urgent requests or questions.  Due to long hold times on the telephone, sending your provider a message by [facility] may be a faster and more efficient way to get a response.  Please allow 48 business hours for a response.  Please remember that this is for non-urgent requests.   _______________________________________________________  Thank you for choosing me and [facility].  Vicie Mutters PA

## 2021-06-13 DIAGNOSIS — E785 Hyperlipidemia, unspecified: Secondary | ICD-10-CM | POA: Diagnosis not present

## 2021-06-13 DIAGNOSIS — E039 Hypothyroidism, unspecified: Secondary | ICD-10-CM | POA: Diagnosis not present

## 2021-06-13 DIAGNOSIS — E1169 Type 2 diabetes mellitus with other specified complication: Secondary | ICD-10-CM | POA: Diagnosis not present

## 2021-06-13 DIAGNOSIS — I10 Essential (primary) hypertension: Secondary | ICD-10-CM | POA: Diagnosis not present

## 2021-06-13 DIAGNOSIS — D649 Anemia, unspecified: Secondary | ICD-10-CM | POA: Diagnosis not present

## 2021-06-19 ENCOUNTER — Encounter: Payer: Self-pay | Admitting: Gastroenterology

## 2021-06-19 ENCOUNTER — Other Ambulatory Visit: Payer: Self-pay

## 2021-06-19 ENCOUNTER — Ambulatory Visit (AMBULATORY_SURGERY_CENTER): Payer: Medicare Other | Admitting: Gastroenterology

## 2021-06-19 VITALS — BP 99/63 | HR 59 | Temp 97.7°F | Resp 14 | Ht 70.0 in | Wt 190.0 lb

## 2021-06-19 DIAGNOSIS — K2970 Gastritis, unspecified, without bleeding: Secondary | ICD-10-CM | POA: Diagnosis not present

## 2021-06-19 DIAGNOSIS — K319 Disease of stomach and duodenum, unspecified: Secondary | ICD-10-CM | POA: Diagnosis not present

## 2021-06-19 DIAGNOSIS — K648 Other hemorrhoids: Secondary | ICD-10-CM | POA: Diagnosis not present

## 2021-06-19 DIAGNOSIS — K449 Diaphragmatic hernia without obstruction or gangrene: Secondary | ICD-10-CM | POA: Diagnosis not present

## 2021-06-19 DIAGNOSIS — D509 Iron deficiency anemia, unspecified: Secondary | ICD-10-CM | POA: Diagnosis not present

## 2021-06-19 DIAGNOSIS — D125 Benign neoplasm of sigmoid colon: Secondary | ICD-10-CM

## 2021-06-19 DIAGNOSIS — K219 Gastro-esophageal reflux disease without esophagitis: Secondary | ICD-10-CM | POA: Diagnosis not present

## 2021-06-19 MED ORDER — SODIUM CHLORIDE 0.9 % IV SOLN: 500.0000 mL | Freq: Once | INTRAVENOUS | Status: DC

## 2021-06-19 NOTE — Progress Notes (Signed)
To Pacu, VSS. Report to [email or web address] 

## 2021-06-19 NOTE — Progress Notes (Signed)
VS-CW  Pt's states no medical or surgical changes since previsit or office visit.  

## 2021-06-19 NOTE — Progress Notes (Signed)
HPI: ?This is a man with Iron deficiency, intermittently microcytic, (not anemic), hemocult negative ? ?Inadvertantly put on my schedule I think, patient of Dr. Fuller Plan ? ? ?ROS: complete GI ROS as described in HPI, all other review negative. ? ?Constitutional:  No unintentional weight loss ? ? ?Past Medical History:  ?Diagnosis Date  ? Allergy   ? seasonal  ? Anemia   ? Bilateral inguinal hernia   ? Chronic kidney disease   ? h/o- renal calculi- passed spont. , 10 episodes over the course of 20 yrs.   ? GERD (gastroesophageal reflux disease)   ? Hyperlipidemia   ? Hypertension   ? Hypothyroidism   ? Osteoporosis   ? Sleep apnea   ? CPAP q night - study done 8-10 yrs. ago   ? Testosterone deficiency   ? injections q 3 week- July 8- last injection   ? Thyroid disease   ? ? ?Past Surgical History:  ?Procedure Laterality Date  ? ANAL FISSURE REPAIR    ? CHOLECYSTECTOMY    ? COLONOSCOPY    ? FIBULA FRACTURE SURGERY    ? fibula and tibia fracture   ? FRACTURE SURGERY    ? L leg -plate & screws - ORIF- fibula  ? HEMORRHOID SURGERY    ? HERNIA REPAIR  04/19/2011  ? bilateral  ? LAPAROSCOPIC INGUINAL HERNIA REPAIR  04/19/2011  ? bilateral  ? LAPAROSCOPY  10/11/2011  ? Procedure: LAPAROSCOPY DIAGNOSTIC;  Surgeon: Adin Hector, MD;  Location: [place] [facility];  Service: General;  Laterality: N/A;  UMBILICAL EXPLORATION  ? MASS EXCISION  10/11/2011  ? Procedure: EXCISION MASS;  Surgeon: Adin Hector, MD;  Location: [place] [facility];  Service: General;  Laterality: N/A;  EXCISION OF UMBILICAL MASS  ? POLYPECTOMY    ? UPPER GASTROINTESTINAL ENDOSCOPY    ? ? ?Current Outpatient Medications  ?Medication Sig Dispense Refill  ? Calcium Carbonate-Vitamin D (CALCIUM-VITAMIN D) 600-125 MG-UNIT TABS Take by mouth daily.    ? cetirizine (ZYRTEC) 5 MG tablet Take 10 mg by mouth daily.     ? cholecalciferol (VITAMIN D) 1000 UNITS tablet Take 1,000 Units by mouth 2 (two) times daily.    ? Dorzolamide HCl-Timolol Mal PF 2-0.5 % SOLN Apply 2 drops to eye  daily.    ? fish oil-omega-3 fatty acids 1000 MG capsule Take 1 g by mouth 4 (four) times daily.     ? levothyroxine (SYNTHROID) 125 MCG tablet Take 125 mcg by mouth every morning.    ? OVER THE COUNTER MEDICATION Magnesium '500mg'$  po daily    ? pantoprazole (PROTONIX) 40 MG tablet Take 40 mg by mouth daily.    ? simvastatin (ZOCOR) 40 MG tablet Take 40 mg by mouth every evening.    ? telmisartan (MICARDIS) 80 MG tablet Take 80 mg by mouth daily.    ? testosterone cypionate (DEPOTESTOTERONE CYPIONATE) 100 MG/ML injection Inject into the muscle every 21 ( twenty-one) days. For IM use only    ? denosumab (PROLIA) 60 MG/ML SOSY injection     ? EPINEPHrine 0.3 mg/0.3 mL IJ SOAJ injection Inject 0.3 mg into the muscle as needed for anaphylaxis.    ? Multiple Vitamin (MULTIVITAMIN PO) Take by mouth daily.      ? ?Current Facility-Administered Medications  ?Medication Dose Route Frequency Provider Last Rate Last Admin  ? 0.9 %  sodium chloride infusion  500 mL Intravenous Once Milus Banister, MD      ? ? ?Allergies as of 06/19/2021 -  Review Complete 06/19/2021  ?Allergen Reaction Noted  ? Alpha-gal Anaphylaxis 06/21/2020  ? ? ?Family History  ?Problem Relation Age of Onset  ? Stroke Mother   ? Stroke Father   ? Colon cancer Neg Hx   ? Rectal cancer Neg Hx   ? Stomach cancer Neg Hx   ? Esophageal cancer Neg Hx   ? Colon polyps Neg Hx   ? Allergic rhinitis Neg Hx   ? Asthma Neg Hx   ? Angioedema Neg Hx   ? Atopy Neg Hx   ? Eczema Neg Hx   ? Immunodeficiency Neg Hx   ? Urticaria Neg Hx   ? ? ?Social History  ? ?Socioeconomic History  ? Marital status: Married  ?  Spouse name: Not on file  ? Number of children: Not on file  ? Years of education: Not on file  ? Highest education level: Not on file  ?Occupational History  ? Occupation: retired  ?Tobacco Use  ? Smoking status: Former  ?  Types: Cigarettes  ?  Quit date: 06/05/1998  ?  Years since quitting: 23.0  ? Smokeless tobacco: Never  ?Vaping Use  ? Vaping Use: Never used   ?Substance and Sexual Activity  ? Alcohol use: No  ? Drug use: No  ? Sexual activity: Not Currently  ?Other Topics Concern  ? Not on file  ?Social History Narrative  ? Not on file  ? ?Social Determinants of Health  ? ?Financial Resource Strain: Not on file  ?Food Insecurity: Not on file  ?Transportation Needs: Not on file  ?Physical Activity: Not on file  ?Stress: Not on file  ?Social Connections: Not on file  ?Intimate Partner Violence: Not on file  ? ? ? ?Physical Exam: ?BP (!) 154/93   Pulse 64   Temp 97.7 ?F (36.5 ?C)   Ht '5\' 10"'$  (1.778 m)   Wt 190 lb (86.2 kg)   SpO2 96%   BMI 27.26 kg/m?  ?Constitutional: generally well-appearing ?Psychiatric: alert and oriented x3 ?Lungs: CTA bilaterally ?Heart: no MCR ? ?Assessment and plan: ?81 y.o. male with with iron deficiency, not anemic, hemocult negative ? ?Colonsocopy and egd today ? ?Care is appropriate for the ambulatory setting. ? ?Owens Loffler, MD ?[facility] ?06/19/2021, 1:36 PM ? ? ? ?

## 2021-06-19 NOTE — Op Note (Signed)
[name] ?Patient Name: Douglas [name] ?Procedure Date: 06/19/2021 1:32 PM ?MRN: [id number] ?Endoscopist: Milus Banister , MD ?Age: 81 ?Referring MD:  ?Date of Birth: September 15, 1940 ?Gender: Male ?Account #: [id number] ?Procedure:                Colonoscopy ?Indications:              Iron deficiency, not anemic, hemocult test  ?                          negative, no overt bleeding, intermittent  ?                          microcytosis ?Medicines:                Monitored Anesthesia Care ?Procedure:                Pre-Anesthesia Assessment: ?                          - Prior to the procedure, a History and Physical  ?                          was performed, and patient medications and  ?                          allergies were reviewed. The patient's tolerance of  ?                          previous anesthesia was also reviewed. The risks  ?                          and benefits of the procedure and the sedation  ?                          options and risks were discussed with the patient.  ?                          All questions were answered, and informed consent  ?                          was obtained. Prior Anticoagulants: The patient has  ?                          taken no previous anticoagulant or antiplatelet  ?                          agents. ASA Grade Assessment: II - A patient with  ?                          mild systemic disease. After reviewing the risks  ?                          and benefits, the patient was deemed in  ?  satisfactory condition to undergo the procedure. ?                          After obtaining informed consent, the colonoscope  ?                          was passed under direct vision. Throughout the  ?                          procedure, the patient's blood pressure, pulse, and  ?                          oxygen saturations were monitored continuously. The  ?                          CF [id number] #[id number] was introduced through the anus  ?                           and advanced to the the cecum, identified by  ?                          appendiceal orifice and ileocecal valve. The  ?                          colonoscopy was performed without difficulty. The  ?                          patient tolerated the procedure well. The quality  ?                          of the bowel preparation was good. The ileocecal  ?                          valve, appendiceal orifice, and rectum were  ?                          photographed. ?Scope In: 1:44:19 PM ?Scope Out: 1:56:03 PM ?Scope Withdrawal Time: 0 hours 8 minutes 1 second  ?Total Procedure Duration: 0 hours 11 minutes 44 seconds  ?Findings:                 A 5 mm polyp was found in the sigmoid colon. The  ?                          polyp was sessile. The polyp was removed with a  ?                          cold snare. Resection and retrieval were complete. ?                          Internal hemorrhoids were found. The hemorrhoids  ?                          were small. ?  The exam was otherwise without abnormality on  ?                          direct and retroflexion views. ?Complications:            No immediate complications. Estimated blood loss:  ?                          None. ?Estimated Blood Loss:     Estimated blood loss: none. ?Impression:               - One 5 mm polyp in the sigmoid colon, removed with  ?                          a cold snare. Resected and retrieved. ?                          - Internal hemorrhoids. ?                          - The examination was otherwise normal on direct  ?                          and retroflexion views. ?Recommendation:           - Await pathology results. ?                          - EGD now. ?Milus Banister, MD ?06/19/2021 1:58:27 PM ?This report has been signed electronically. ?

## 2021-06-19 NOTE — Patient Instructions (Signed)
Discharge instructions given. ?Handouts on polyps and Hemorrhoids. ?Biopsies taken. ?Resume previous medications. ?YOU HAD AN ENDOSCOPIC PROCEDURE TODAY AT [facility]:   Refer to the procedure report that was given to you for any specific questions about what was found during the examination.  If the procedure report does not answer your questions, please call your gastroenterologist to clarify.  If you requested that your care partner not be given the details of your procedure findings, then the procedure report has been included in a sealed envelope for you to review at your convenience later. ? ?YOU SHOULD EXPECT: Some feelings of bloating in the abdomen. Passage of more gas than usual.  Walking can help get rid of the air that was put into your GI tract during the procedure and reduce the bloating. If you had a lower endoscopy (such as a colonoscopy or flexible sigmoidoscopy) you may notice spotting of blood in your stool or on the toilet paper. If you underwent a bowel prep for your procedure, you may not have a normal bowel movement for a few days. ? ?Please Note:  You might notice some irritation and congestion in your nose or some drainage.  This is from the oxygen used during your procedure.  There is no need for concern and it should clear up in a day or so. ? ?SYMPTOMS TO REPORT IMMEDIATELY: ? ?Following lower endoscopy (colonoscopy or flexible sigmoidoscopy): ? Excessive amounts of blood in the stool ? Significant tenderness or worsening of abdominal pains ? Swelling of the abdomen that is new, acute ? Fever of 100?F or higher ? ?Following upper endoscopy (EGD) ? Vomiting of blood or coffee ground material ? New chest pain or pain under the shoulder blades ? Painful or persistently difficult swallowing ? New shortness of breath ? Fever of 100?F or higher ? Black, tarry-looking stools ? ?For urgent or emergent issues, a gastroenterologist can be reached at any hour by calling [phone]  [phone]. ?Do not use MyChart messaging for urgent concerns.  ? ? ?DIET:  We do recommend a small meal at first, but then you may proceed to your regular diet.  Drink plenty of fluids but you should avoid alcoholic beverages for 24 hours. ? ?ACTIVITY:  You should plan to take it easy for the rest of today and you should NOT DRIVE or use heavy machinery until tomorrow (because of the sedation medicines used during the test).   ? ?FOLLOW UP: ?Our staff will call the number listed on your records 48-72 hours following your procedure to check on you and address any questions or concerns that you may have regarding the information given to you following your procedure. If we do not reach you, we will leave a message.  We will attempt to reach you two times.  During this call, we will ask if you have developed any symptoms of COVID 19. If you develop any symptoms (ie: fever, flu-like symptoms, shortness of breath, cough etc.) before then, please call [phone].  If you test positive for Covid 19 in the 2 weeks post procedure, please call and report this information to [place].   ? ?If any biopsies were taken you will be contacted by phone or by letter within the next 1-3 weeks.  Please call us at [phone] if you have not heard about the biopsies in 3 weeks.  ? ? ?SIGNATURES/CONFIDENTIALITY: ?You and/or your care partner have signed paperwork which will be entered into your electronic medical record.  These  signatures attest to the fact that that the information above on your After Visit Summary has been reviewed and is understood.  Full responsibility of the confidentiality of this discharge information lies with you and/or your care-partner.  ?

## 2021-06-19 NOTE — Progress Notes (Signed)
Called to room to assist during endoscopic procedure.  Patient ID and intended procedure confirmed with present staff. Received instructions for my participation in the procedure from the performing physician.  

## 2021-06-19 NOTE — Op Note (Signed)
[place] ?Patient Name: Douglas [name] ?Procedure Date: 06/19/2021 1:32 PM ?MRN: [id number] ?Endoscopist: Milus Banister , MD ?Age: 81 ?Referring MD:  ?Date of Birth: 06-11-1940 ?Gender: Male ?Account #: [id number] ?Procedure:                Upper GI endoscopy ?Indications:              Iron deficiency, low ferritin, normal Hb, hemocult  ?                          negative ?Medicines:                Monitored Anesthesia Care ?Procedure:                Pre-Anesthesia Assessment: ?                          - Prior to the procedure, a History and Physical  ?                          was performed, and patient medications and  ?                          allergies were reviewed. The patient's tolerance of  ?                          previous anesthesia was also reviewed. The risks  ?                          and benefits of the procedure and the sedation  ?                          options and risks were discussed with the patient.  ?                          All questions were answered, and informed consent  ?                          was obtained. Prior Anticoagulants: The patient has  ?                          taken no previous anticoagulant or antiplatelet  ?                          agents. ASA Grade Assessment: II - A patient with  ?                          mild systemic disease. After reviewing the risks  ?                          and benefits, the patient was deemed in  ?                          satisfactory condition to undergo the procedure. ?  After obtaining informed consent, the endoscope was  ?                          passed under direct vision. Throughout the  ?                          procedure, the patient's blood pressure, pulse, and  ?                          oxygen saturations were monitored continuously. The  ?                          GIF [id number] #[id number] was introduced through the  ?                          mouth, and advanced to the second part of  duodenum.  ?                          The upper GI endoscopy was accomplished without  ?                          difficulty. The patient tolerated the procedure  ?                          well. ?Scope In: ?Scope Out: ?Findings:                 Medium sized hiatal hernia with typical  ?                          foreshortened and tortuous esophagus. ?                          Several shallow Cameron's type erosions along the  ?                          lip of the hiatal hernia. ?                          Mild inflammation characterized by erythema was  ?                          found in the gastric antrum. Biopsies were taken  ?                          with a cold forceps for histology. ?                          The exam was otherwise without abnormality. ?                          Normal duodenum was biopsied to check for Celiac  ?                          Sprue. ?Complications:  No immediate complications. Estimated blood loss:  ?                          None. ?Estimated Blood Loss:     Estimated blood loss: none. ?Impression:               - Medium sized hiatal hernia with typical  ?                          foreshortened and tortuous esophagus. ?                          - Several shallow Cameron's type erosions along the  ?                          lip of the hiatal hernia. ?                          - Mild, non-specific gastritis. Biopsied to check  ?                          for H. pylori. ?                          - Normal duodenum was biopsied to check for Celiac  ?                          Sprue. ?                          - The examination was otherwise normal. ?Recommendation:           - Patient has a contact number available for  ?                          emergencies. The signs and symptoms of potential  ?                          delayed complications were discussed with the  ?                          patient. Return to normal activities tomorrow.  ?                          Written  discharge instructions were provided to the  ?                          patient. ?                          - Resume previous diet. ?                          - Continue present medications. ?                          - Await pathology results. ?Milus Banister, MD ?06/19/2021 2:10:26  PM ?This report has been signed electronically. ?

## 2021-06-21 ENCOUNTER — Telehealth: Payer: Self-pay

## 2021-06-21 NOTE — Telephone Encounter (Signed)
?  Follow up Call- ? ? ?  06/19/2021  ? 12:45 PM  ?Call back number  ?Post procedure Call Back phone  # [phone]  ?Permission to leave phone message Yes  ?  ? ?Patient questions: ? ?Do you have a fever, pain , or abdominal swelling? No. ?Pain Score  0 * ? ?Have you tolerated food without any problems? Yes.   ? ?Have you been able to return to your normal activities? Yes.   ? ?Do you have any questions about your discharge instructions: ?Diet   No. ?Medications  No. ?Follow up visit  No ? ?Do you have questions or concerns about your Care? No. ? ?Actions: ?* If pain score is 4 or above: ?No action needed, pain <4. ? ? ?

## 2021-06-21 NOTE — Telephone Encounter (Signed)
Left message on follow up call. 

## 2021-06-25 ENCOUNTER — Encounter: Payer: Self-pay | Admitting: Gastroenterology

## 2021-07-10 DIAGNOSIS — M810 Age-related osteoporosis without current pathological fracture: Secondary | ICD-10-CM | POA: Diagnosis not present

## 2021-07-10 DIAGNOSIS — E785 Hyperlipidemia, unspecified: Secondary | ICD-10-CM | POA: Diagnosis not present

## 2021-07-10 DIAGNOSIS — E1169 Type 2 diabetes mellitus with other specified complication: Secondary | ICD-10-CM | POA: Diagnosis not present

## 2021-07-10 DIAGNOSIS — I10 Essential (primary) hypertension: Secondary | ICD-10-CM | POA: Diagnosis not present

## 2021-07-10 DIAGNOSIS — E611 Iron deficiency: Secondary | ICD-10-CM | POA: Diagnosis not present

## 2021-07-10 DIAGNOSIS — I129 Hypertensive chronic kidney disease with stage 1 through stage 4 chronic kidney disease, or unspecified chronic kidney disease: Secondary | ICD-10-CM | POA: Diagnosis not present

## 2021-07-10 DIAGNOSIS — I2510 Atherosclerotic heart disease of native coronary artery without angina pectoris: Secondary | ICD-10-CM | POA: Diagnosis not present

## 2021-07-10 DIAGNOSIS — E039 Hypothyroidism, unspecified: Secondary | ICD-10-CM | POA: Diagnosis not present

## 2021-07-27 ENCOUNTER — Other Ambulatory Visit: Payer: Self-pay

## 2021-07-27 DIAGNOSIS — M810 Age-related osteoporosis without current pathological fracture: Secondary | ICD-10-CM

## 2021-07-27 DIAGNOSIS — Z20822 Contact with and (suspected) exposure to covid-19: Secondary | ICD-10-CM | POA: Diagnosis not present

## 2021-07-31 ENCOUNTER — Telehealth: Payer: Self-pay | Admitting: Pharmacy Technician

## 2021-07-31 NOTE — Telephone Encounter (Addendum)
Prolia BIV - complete Auth Submission: no auth needed Payer: medicare a&b  Medication & CPT/J Code(s) submitted: Prolia (Denosumab) [id number] Route of submission (phone, fax, portal): phone Auth type: Buy/Bill Units/visits requested: x1 dose Reference number: [id number] Approval from: 09/07/21 to 03/25/23 Patient will be scheduled as soon as possible     03/21/22 - Medicare part b and supplement eligibility verified and approval extended till 03/25/23

## 2021-09-06 ENCOUNTER — Encounter: Payer: Self-pay | Admitting: Pulmonary Disease

## 2021-09-06 NOTE — Patient Outreach (Signed)
Received a  referral notification for Mr. Suppa. I have assigned Valente David, RN to call for follow up and determine if there are any Case Management needs.    [place], [place], [place] Management [phone]

## 2021-09-10 ENCOUNTER — Other Ambulatory Visit: Payer: Self-pay | Admitting: *Deleted

## 2021-09-10 ENCOUNTER — Encounter: Payer: Self-pay | Admitting: *Deleted

## 2021-09-10 NOTE — Patient Outreach (Signed)
[facility]) Care Management Telephonic RN Care Manager Note   09/10/2021 Name:  Douglas [name] MRN:  [id number] DOB:  06-27-1940  Summary: Referral Date: 6/15 Referral Source: Insurance Referral Reason: Chronic Disease management  Insurance: Medicare   Outreach attempt #1, successful.  Identity verified.  This care manager introduced self and stated purpose of call.  [facility] care management services explained.    Social: Lives with wife but is independent in all ADLs.    Conditions: Per chart, has history of HTN, OSA, GERD, Osteoporosis, Thrombocytopenia, and Interstitial pulmonary fibrosis.  Monitors blood pressure at home, range 120-130/70-80.  Medications: Reviewed with member, report taking as instructed.  Denies need for financial assistance.   Appointments: Follows up with PCP twice a year, AWV scheduled for later this year.  Visits pulmonology office yearly. Able to provide his own transportation.    Consent: Agrees to [facility] engagement.  Subjective: Douglas [name] is an 81 y.o. year old male who is a primary patient of Perini, Elta Guadeloupe, MD. The care management team was consulted for assistance with care management and/or care coordination needs.    Telephonic RN Care Manager completed Telephone Visit today.  Objective:   Medications Reviewed Today     Reviewed by Valente David, RN (Registered Nurse) on 09/10/21 at [place] List Status: <None>   Medication Order Taking? Sig Documenting Provider Last Dose Status Informant  Calcium Carbonate-Vitamin D (CALCIUM-VITAMIN D) 600-125 MG-UNIT TABS [id number]  Take by mouth daily. [provider]  Active   cetirizine (ZYRTEC) 5 MG tablet [id number]  Take 10 mg by mouth daily.  [provider]  Active Self  cholecalciferol (VITAMIN D) 1000 UNITS tablet [id number]  Take 1,000 Units by mouth 2 (two) times daily. [provider]  Active Self  denosumab (PROLIA) 60 MG/ML SOSY injection [id number]    [provider]  Active   Dorzolamide HCl-Timolol Mal PF 2-0.5 % SOLN [id number]  Apply 2 drops to eye daily. [provider]  Active   EPINEPHrine 0.3 mg/0.3 mL IJ SOAJ injection [id number]  Inject 0.3 mg into the muscle as needed for anaphylaxis. [provider]  Active   fish oil-omega-3 fatty acids 1000 MG capsule [id number]  Take 1 g by mouth 4 (four) times daily.  [provider]  Active Self  levothyroxine (SYNTHROID) 125 MCG tablet [id number]  Take 125 mcg by mouth every morning. [provider]  Active   Multiple Vitamin (MULTIVITAMIN PO) [id number]  Take by mouth daily.   [provider]  Active Self  OVER THE COUNTER MEDICATION [id number]  Magnesium 576m po daily [provider]  Active   pantoprazole (PROTONIX) 40 MG tablet [id number] Take 40 mg by mouth daily. [provider]  Active   simvastatin (ZOCOR) 40 MG tablet [id number] Take 40 mg by mouth every evening. [provider]  Active Self  telmisartan (MICARDIS) 80 MG tablet [id number] Take 80 mg by mouth daily. [provider]  Active   testosterone cypionate (DEPOTESTOTERONE CYPIONATE) 100 MG/ML injection [id number] Inject into the muscle every 21 ( twenty-one) days. For IM use only [provider]  Active              SDOH:  (Social Determinants of Health) assessments and interventions performed:     Care Plan  Review of patient past medical history, allergies, medications, health status, including review of consultants reports, laboratory and other test data, was performed as part of  comprehensive evaluation for care management services.   Care Plan : [facility] Plan of Care (Adult)  Updates made by Valente David, RN since 09/10/2021 12:00 AM     Problem: Chronic care management for chronic medical conditions (interstitial pulmonary fibrosis and HTN)   Priority: High     Long-Range Goal: Member will affectively manage chronic  medical conditions (Interstitial Pulmonary Fibrosis and HTN)   Start Date: 09/10/2021  Expected End Date: 09/11/2022  Priority: High  Note:   Current Barriers:  Chronic Disease Management support and education needs related to HTN and Pulmonary Fibrosis   RNCM Clinical Goal(s):  Patient will verbalize understanding of plan for management of HTN and Pulmonary Fibrosis as evidenced by Ability to verbalize adequate plan of care and decrease risk of admission continue to work with RN Care Manager to address care management and care coordination needs related to  HTN and Pulmonary Fibrosis as evidenced by adherence to CM Team Scheduled appointments through collaboration with RN Care manager, provider, and care team.   Interventions: Inter-disciplinary care team collaboration (see longitudinal plan of care) Evaluation of current treatment plan related to  self management and patient's adherence to plan as established by provider   Pulmonary Fibrosis  (Status:  New goal.)  Long Term Goal Evaluation of current treatment plan related to  Pulmonary Fibrosis ,  self-management and patient's adherence to plan as established by provider. Discussed plans with patient for ongoing care management follow up and provided patient with direct contact information for care management team Advised patient to Follow up with pulmonology as recommended, stated yearly  Hypertension Interventions:  (Status:  New goal.) Long Term Goal Last practice recorded BP readings:  BP Readings from Last 3 Encounters:  06/19/21 99/63  05/02/21 140/70  02/16/21 135/71  Most recent eGFR/CrCl: No results found for: "EGFR"  No components found for: "CRCL"  Evaluation of current treatment plan related to hypertension self management and patient's adherence to plan as established by provider Reviewed medications with patient and discussed importance of compliance Advised patient, providing education and rationale, to monitor blood  pressure daily and record, calling PCP for findings outside established parameters  Patient Goals/Self-Care Activities: Take all medications as prescribed Attend all scheduled provider appointments check blood pressure 3 times per week choose a place to take my blood pressure (home, clinic or office, retail store) write blood pressure results in a log or diary  Follow Up Plan:  The patient has been provided with contact information for the care management team and has been advised to call with any health related questions or concerns.        Plan:  Telephone follow up appointment with care management team member scheduled for:  3 months The patient has been provided with contact information for the care management team and has been advised to call with any health related questions or concerns.   Valente David, RN, MSN, [place] Manager [phone]

## 2021-10-01 ENCOUNTER — Ambulatory Visit (INDEPENDENT_AMBULATORY_CARE_PROVIDER_SITE_OTHER): Payer: Medicare Other

## 2021-10-01 VITALS — BP 148/80 | HR 62 | Temp 98.5°F | Resp 18 | Ht 70.0 in | Wt 184.4 lb

## 2021-10-01 DIAGNOSIS — M810 Age-related osteoporosis without current pathological fracture: Secondary | ICD-10-CM

## 2021-10-01 MED ORDER — DENOSUMAB 60 MG/ML ~~LOC~~ SOSY
60.0000 mg | PREFILLED_SYRINGE | Freq: Once | SUBCUTANEOUS | Status: AC
  Administered 2021-10-01: 60 mg via SUBCUTANEOUS

## 2021-10-01 NOTE — Progress Notes (Signed)
Diagnosis: Osteoporosis  Provider:  Marshell Garfinkel, MD  Procedure: Injection  Prolia (Denosumab), Dose: 60 mg, Site: subcutaneous, Number of injections: 1  Discharge: Condition: Good, Destination: Home . AVS provided to patient.   Performed by:  Jonelle Sidle, RN

## 2021-10-03 DIAGNOSIS — H21233 Degeneration of iris (pigmentary), bilateral: Secondary | ICD-10-CM | POA: Diagnosis not present

## 2021-10-03 DIAGNOSIS — H40051 Ocular hypertension, right eye: Secondary | ICD-10-CM | POA: Diagnosis not present

## 2021-10-23 DIAGNOSIS — L812 Freckles: Secondary | ICD-10-CM | POA: Diagnosis not present

## 2021-10-23 DIAGNOSIS — I788 Other diseases of capillaries: Secondary | ICD-10-CM | POA: Diagnosis not present

## 2021-10-23 DIAGNOSIS — D485 Neoplasm of uncertain behavior of skin: Secondary | ICD-10-CM | POA: Diagnosis not present

## 2021-10-23 DIAGNOSIS — L821 Other seborrheic keratosis: Secondary | ICD-10-CM | POA: Diagnosis not present

## 2021-10-23 DIAGNOSIS — L570 Actinic keratosis: Secondary | ICD-10-CM | POA: Diagnosis not present

## 2021-10-23 DIAGNOSIS — D1801 Hemangioma of skin and subcutaneous tissue: Secondary | ICD-10-CM | POA: Diagnosis not present

## 2021-10-23 DIAGNOSIS — L905 Scar conditions and fibrosis of skin: Secondary | ICD-10-CM | POA: Diagnosis not present

## 2021-11-28 ENCOUNTER — Ambulatory Visit: Payer: Medicare Other | Admitting: *Deleted

## 2021-12-04 ENCOUNTER — Encounter: Payer: Self-pay | Admitting: *Deleted

## 2021-12-04 ENCOUNTER — Ambulatory Visit: Payer: Self-pay | Admitting: *Deleted

## 2021-12-04 NOTE — Patient Outreach (Signed)
  Care Coordination   Initial Visit Note   12/04/2021 Name: Douglas [name] MRN: [id number] DOB: 10/27/1940  Douglas [name] is a 81 y.o. year old male who sees Crist Infante, MD for primary care. I spoke with  Orland Penman by phone today.  What matters to the patients health and wellness today?  Report he continues to do well.  Denies any shortness of breath or trouble with hypertension.  Aware of AWV next month.  Denies any urgent concerns, encouraged to contact this care manager with questions.      Goals Addressed             This Visit's Progress    COMPLETED: Care Coordination Actvities - No follow up needed       Care Coordination Interventions: Patient interviewed about adult health maintenance status including  Colonoscopy    Pneumonia Vaccine Influenza Vaccine COVID vaccination    Regular eye checkups Regular Dental Care    Blood Pressure    Provided education about Next AWV scheduled for October SDOH assessment done         SDOH assessments and interventions completed:  Yes  SDOH Interventions Today    Flowsheet Row Most Recent Value  SDOH Interventions   Food Insecurity Interventions Intervention Not Indicated  Housing Interventions Intervention Not Indicated  Transportation Interventions Intervention Not Indicated  Utilities Interventions Intervention Not Indicated        Care Coordination Interventions Activated:  Yes  Care Coordination Interventions:  Yes, provided   Follow up plan: No further intervention required.   Encounter Outcome:  Pt. Visit Completed   Valente David, RN, BSN, MSN, [place] Care Management Care Management Coordinator [phone]

## 2021-12-04 NOTE — Patient Instructions (Signed)
Visit Information  Thank you for taking time to visit with me today. Please don't hesitate to contact me if I can be of assistance to you.  Following are the goals we discussed today:  Keep next appointment with PCP in October  Please call the Suicide and Crisis Lifeline: 988 call the [place] National Suicide Prevention Lifeline: 484-371-2291 or TTY: 580-076-8615 TTY 503-121-9257) to talk to a trained counselor call 1-800-273-TALK (toll free, 24 hour hotline) go to [facility] [address] [place] [phone]) call 911 if you are experiencing a Mental Health or [place] or need someone to talk to.  Patient verbalizes understanding of instructions and care plan provided today and agrees to view in [place]. Active MyChart status and patient understanding of how to access instructions and care plan via MyChart confirmed with patient.     The patient has been provided with contact information for the care management team and has been advised to call with any health related questions or concerns.   Valente David, RN, BSN, MSN, [facility] Care Management Care Management Coordinator [phone]

## 2022-01-07 DIAGNOSIS — D649 Anemia, unspecified: Secondary | ICD-10-CM | POA: Diagnosis not present

## 2022-01-07 DIAGNOSIS — E785 Hyperlipidemia, unspecified: Secondary | ICD-10-CM | POA: Diagnosis not present

## 2022-01-07 DIAGNOSIS — E291 Testicular hypofunction: Secondary | ICD-10-CM | POA: Diagnosis not present

## 2022-01-07 DIAGNOSIS — E039 Hypothyroidism, unspecified: Secondary | ICD-10-CM | POA: Diagnosis not present

## 2022-01-07 DIAGNOSIS — E611 Iron deficiency: Secondary | ICD-10-CM | POA: Diagnosis not present

## 2022-01-07 DIAGNOSIS — Z125 Encounter for screening for malignant neoplasm of prostate: Secondary | ICD-10-CM | POA: Diagnosis not present

## 2022-01-07 DIAGNOSIS — R7989 Other specified abnormal findings of blood chemistry: Secondary | ICD-10-CM | POA: Diagnosis not present

## 2022-01-07 DIAGNOSIS — Z1212 Encounter for screening for malignant neoplasm of rectum: Secondary | ICD-10-CM | POA: Diagnosis not present

## 2022-01-07 DIAGNOSIS — E1169 Type 2 diabetes mellitus with other specified complication: Secondary | ICD-10-CM | POA: Diagnosis not present

## 2022-01-07 DIAGNOSIS — I10 Essential (primary) hypertension: Secondary | ICD-10-CM | POA: Diagnosis not present

## 2022-01-15 DIAGNOSIS — E785 Hyperlipidemia, unspecified: Secondary | ICD-10-CM | POA: Diagnosis not present

## 2022-01-15 DIAGNOSIS — K219 Gastro-esophageal reflux disease without esophagitis: Secondary | ICD-10-CM | POA: Diagnosis not present

## 2022-01-15 DIAGNOSIS — Z1339 Encounter for screening examination for other mental health and behavioral disorders: Secondary | ICD-10-CM | POA: Diagnosis not present

## 2022-01-15 DIAGNOSIS — G4733 Obstructive sleep apnea (adult) (pediatric): Secondary | ICD-10-CM | POA: Diagnosis not present

## 2022-01-15 DIAGNOSIS — N1831 Chronic kidney disease, stage 3a: Secondary | ICD-10-CM | POA: Diagnosis not present

## 2022-01-15 DIAGNOSIS — E1169 Type 2 diabetes mellitus with other specified complication: Secondary | ICD-10-CM | POA: Diagnosis not present

## 2022-01-15 DIAGNOSIS — Z0000 Encounter for general adult medical examination without abnormal findings: Secondary | ICD-10-CM | POA: Diagnosis not present

## 2022-01-15 DIAGNOSIS — M810 Age-related osteoporosis without current pathological fracture: Secondary | ICD-10-CM | POA: Diagnosis not present

## 2022-01-15 DIAGNOSIS — R079 Chest pain, unspecified: Secondary | ICD-10-CM | POA: Diagnosis not present

## 2022-01-15 DIAGNOSIS — R82998 Other abnormal findings in urine: Secondary | ICD-10-CM | POA: Diagnosis not present

## 2022-01-15 DIAGNOSIS — I129 Hypertensive chronic kidney disease with stage 1 through stage 4 chronic kidney disease, or unspecified chronic kidney disease: Secondary | ICD-10-CM | POA: Diagnosis not present

## 2022-01-15 DIAGNOSIS — Z1331 Encounter for screening for depression: Secondary | ICD-10-CM | POA: Diagnosis not present

## 2022-01-15 DIAGNOSIS — I2510 Atherosclerotic heart disease of native coronary artery without angina pectoris: Secondary | ICD-10-CM | POA: Diagnosis not present

## 2022-01-15 DIAGNOSIS — J8410 Pulmonary fibrosis, unspecified: Secondary | ICD-10-CM | POA: Diagnosis not present

## 2022-01-15 DIAGNOSIS — Z23 Encounter for immunization: Secondary | ICD-10-CM | POA: Diagnosis not present

## 2022-01-18 ENCOUNTER — Ambulatory Visit (HOSPITAL_BASED_OUTPATIENT_CLINIC_OR_DEPARTMENT_OTHER)
Admission: RE | Admit: 2022-01-18 | Discharge: 2022-01-18 | Disposition: A | Discharge status: Home / Self Care | Payer: Medicare Other | Source: Ambulatory Visit | Attending: Internal Medicine | Admitting: Internal Medicine

## 2022-01-18 ENCOUNTER — Other Ambulatory Visit: Payer: Self-pay | Admitting: Internal Medicine

## 2022-01-18 ENCOUNTER — Other Ambulatory Visit (HOSPITAL_COMMUNITY): Payer: Self-pay | Admitting: Internal Medicine

## 2022-01-18 DIAGNOSIS — R079 Chest pain, unspecified: Secondary | ICD-10-CM

## 2022-01-18 DIAGNOSIS — I700 Atherosclerosis of aorta: Secondary | ICD-10-CM | POA: Diagnosis not present

## 2022-01-18 DIAGNOSIS — K449 Diaphragmatic hernia without obstruction or gangrene: Secondary | ICD-10-CM | POA: Diagnosis not present

## 2022-01-18 DIAGNOSIS — J439 Emphysema, unspecified: Secondary | ICD-10-CM | POA: Diagnosis not present

## 2022-01-18 MED ORDER — IOHEXOL 350 MG/ML SOLN
80.0000 mL | Freq: Once | INTRAVENOUS | Status: AC | PRN
  Administered 2022-01-18: 80 mL via INTRAVENOUS

## 2022-02-05 DIAGNOSIS — J432 Centrilobular emphysema: Secondary | ICD-10-CM | POA: Diagnosis not present

## 2022-02-05 DIAGNOSIS — J849 Interstitial pulmonary disease, unspecified: Secondary | ICD-10-CM | POA: Diagnosis not present

## 2022-02-05 DIAGNOSIS — R911 Solitary pulmonary nodule: Secondary | ICD-10-CM | POA: Diagnosis not present

## 2022-02-05 DIAGNOSIS — K449 Diaphragmatic hernia without obstruction or gangrene: Secondary | ICD-10-CM | POA: Diagnosis not present

## 2022-02-15 DIAGNOSIS — J439 Emphysema, unspecified: Secondary | ICD-10-CM | POA: Diagnosis not present

## 2022-02-15 DIAGNOSIS — R911 Solitary pulmonary nodule: Secondary | ICD-10-CM | POA: Diagnosis not present

## 2022-02-22 DIAGNOSIS — I10 Essential (primary) hypertension: Secondary | ICD-10-CM | POA: Diagnosis not present

## 2022-02-22 DIAGNOSIS — E785 Hyperlipidemia, unspecified: Secondary | ICD-10-CM | POA: Diagnosis not present

## 2022-02-28 ENCOUNTER — Encounter: Payer: Self-pay | Admitting: Interventional Cardiology

## 2022-02-28 ENCOUNTER — Ambulatory Visit: Payer: Medicare Other | Attending: Interventional Cardiology | Admitting: Interventional Cardiology

## 2022-02-28 VITALS — BP 140/76 | HR 74 | Ht 70.0 in | Wt 185.2 lb

## 2022-02-28 DIAGNOSIS — E782 Mixed hyperlipidemia: Secondary | ICD-10-CM | POA: Diagnosis not present

## 2022-02-28 DIAGNOSIS — I10 Essential (primary) hypertension: Secondary | ICD-10-CM

## 2022-02-28 DIAGNOSIS — R072 Precordial pain: Secondary | ICD-10-CM | POA: Diagnosis not present

## 2022-02-28 DIAGNOSIS — I700 Atherosclerosis of aorta: Secondary | ICD-10-CM

## 2022-02-28 DIAGNOSIS — Z87891 Personal history of nicotine dependence: Secondary | ICD-10-CM

## 2022-02-28 MED ORDER — METOPROLOL TARTRATE 50 MG PO TABS: ORAL_TABLET | ORAL | 0 refills | Status: DC

## 2022-02-28 NOTE — Progress Notes (Signed)
Cardiology Office Note   Date:  02/28/2022   ID:  Douglas [name], DOB [birth year], MRN [id number]  PCP:  Douglas Infante, Douglas [name]    No chief complaint on file.    Wt Readings from Last 3 Encounters:  02/28/22 185 lb 3.2 oz (84 kg)  10/01/21 184 lb 6.4 oz (83.6 kg)  06/19/21 190 lb (86.2 kg)       History of Present Illness: Douglas [name] is a 81 y.o. male who is being seen today for the evaluation of chest pain at the request of Douglas Infante, Douglas [name].    He has had high BP readings for the past several months.  1405/75 range more recently.  Was on telmisartan 40 mg.  Amlodipine 5 mg was added.  HCTZ 12.5 mg was added.  No change.  THen this was increased to 25 mg 4 days ago with some improvement to the 110-125 range.   His other issue was chest pain, 2-3 x/week.  Felt like heartburn.  Happened 4-6 months ago.  Relieved with antacids. Episodes last a few minutes.   Chest CT 10/23: "The heart is unremarkable without pericardial effusion. No evidence of thoracic aortic aneurysm or dissection. Atherosclerosis of the aorta again noted."  He has been on simvastatin for hyperlipidemia for years.   Goes to the gym 3x/week.  Takes some classes.  Works on some machines.  No GERD sx with exercise.    Denies : Chest pain. Dizziness. Leg edema. Nitroglycerin use. Orthopnea. Palpitations. Paroxysmal nocturnal dyspnea. Shortness of breath. Syncope.    Past Medical History:  Diagnosis Date   Allergic rhinitis    Allergy    seasonal   Anal fissure    Anemia    Bilateral inguinal hernia    Chest pain    Chronic kidney disease    h/o- renal calculi- passed spont. , 10 episodes over the course of 20 yrs.    Colon polyps    GERD (gastroesophageal reflux disease)    Hyperlipidemia    Hypertension    Hypogonadism in male    Hypothyroidism    Kidney stones    Osteoporosis    Scrotal mass    Sebaceous cyst    Sleep apnea    CPAP q night - study done 8-10 yrs. ago    Testosterone  deficiency    injections q 3 week- July 8- last injection    Thyroid disease    Vitamin D deficiency     Past Surgical History:  Procedure Laterality Date   ANAL FISSURE REPAIR     CHOLECYSTECTOMY     COLONOSCOPY     FIBULA FRACTURE SURGERY     fibula and tibia fracture    FRACTURE SURGERY     L leg -plate & screws - ORIF- fibula   HEMORRHOID SURGERY     HERNIA REPAIR  04/19/2011   bilateral   LAPAROSCOPIC INGUINAL HERNIA REPAIR  04/19/2011   bilateral   LAPAROSCOPY  10/11/2011   Procedure: LAPAROSCOPY DIAGNOSTIC;  Surgeon: Adin Hector, Douglas [name];  Location: [facility];  Service: General;  Laterality: N/A;  UMBILICAL EXPLORATION   MASS EXCISION  10/11/2011   Procedure: EXCISION MASS;  Surgeon: Adin Hector, Douglas [name];  Location: [facility];  Service: General;  Laterality: N/A;  EXCISION OF UMBILICAL MASS   POLYPECTOMY     UPPER GASTROINTESTINAL ENDOSCOPY       Current Outpatient Medications  Medication Sig Dispense Refill   amLODipine (NORVASC) 5 MG tablet  Take 1 tablet by mouth daily.     cetirizine (ZYRTEC) 5 MG tablet Take 10 mg by mouth daily.      cholecalciferol (VITAMIN D) 1000 UNITS tablet Take 1,000 Units by mouth 2 (two) times daily.     denosumab (PROLIA) 60 MG/ML SOSY injection      Dorzolamide HCl-Timolol Mal PF 2-0.5 % SOLN Apply 2 drops to eye daily.     EPINEPHrine 0.3 mg/0.3 mL IJ SOAJ injection Inject 0.3 mg into the muscle as needed for anaphylaxis.     ferrous sulfate 325 (65 FE) MG tablet as directed Orally Once a day     fish oil-omega-3 fatty acids 1000 MG capsule Take 1 g by mouth 4 (four) times daily.      hydrochlorothiazide (HYDRODIURIL) 25 MG tablet      levothyroxine (SYNTHROID) 125 MCG tablet Take 125 mcg by mouth every morning.     Magnesium 500 MG CAPS qd Oral     metroNIDAZOLE (METROCREAM) 0.75 % cream Apply a thin layer to affected areas twice daily after washing External     Multiple Vitamin (MULTIVITAMIN PO) Take by mouth daily.       OVER THE COUNTER  MEDICATION Magnesium '500mg'$  po daily     pantoprazole (PROTONIX) 40 MG tablet Take 40 mg by mouth daily.     simvastatin (ZOCOR) 40 MG tablet Take 40 mg by mouth every evening.     telmisartan (MICARDIS) 80 MG tablet Take 80 mg by mouth daily.     testosterone cypionate (DEPOTESTOTERONE CYPIONATE) 100 MG/ML injection Inject into the muscle every 21 ( twenty-one) days. For IM use only     amLODipine-atorvastatin (CADUET) 5-10 MG tablet  (Patient not taking: Reported on 02/28/2022)     No current facility-administered medications for this visit.    Allergies:   Alpha-gal and Other    Social History:  The patient  reports that he quit smoking about 23 years ago. His smoking use included cigarettes. He has never used smokeless tobacco. He reports that he does not drink alcohol and does not use drugs.   Family History:  The patient's family history includes Stroke in his father and mother.    ROS:  Please see the history of present illness.   Otherwise, review of systems are positive for chest discomfort.   All other systems are reviewed and negative.    PHYSICAL EXAM: VS:  BP (!) 140/76   Pulse 74   Ht '5\' 10"'$  (1.778 m)   Wt 185 lb 3.2 oz (84 kg)   SpO2 97%   BMI 26.57 kg/m  , BMI Body mass index is 26.57 kg/m. GEN: Well nourished, well developed, in no acute distress HEENT: normal Neck: no JVD, carotid bruits, or masses Cardiac: RRR; no murmurs, rubs, or gallops,no edema  Respiratory:  clear to auscultation bilaterally, normal work of breathing GI: soft, nontender, nondistended, + BS MS: no deformity or atrophy Skin: warm and dry, no rash Neuro:  Strength and sensation are intact Psych: euthymic mood, full affect   EKG:   The ekg ordered today demonstrates NSR, no ST changes   Recent Labs: No results found for requested labs within last 365 days.   Lipid Panel No results found for: "CHOL", "TRIG", "HDL", "CHOLHDL", "VLDL", "LDLCALC", "LDLDIRECT"   Other studies  Reviewed: Additional studies/ records that were reviewed today with results demonstrating: labs reviewed; lipids controlled, checked with Dr. Joylene Draft.   ASSESSMENT AND PLAN:  Chest pain: Some typical features.  Some atypical features. Plan CTA coronaries to eval for CAD.  Metoprolol 50 mg x 1 prior to the scan.   Aortic atherosclerosis:  Continue statin.  HTN: The current medical regimen is effective;  continue present plan and medications.  IMproved readings at home recently. Hyperlipidemia: Whole food, plant-based diet.High fiber diet. avoid processed foods.  Potentially change the simvastatin to a more potent lipid-lowering agent like atorvastatin or rosuvastatin based on the calcium score. Former smoker: Quit smoking in 1999.  Now with emphysema.    Current medicines are reviewed at length with the patient today.  The patient concerns regarding his medicines were addressed.  The following changes have been made:  No change  Labs/ tests ordered today include:  No orders of the defined types were placed in this encounter.   Recommend 150 minutes/week of aerobic exercise Low fat, low carb, high fiber diet recommended  Disposition:   FU based on CTA results   Signed, Larae Grooms, Douglas [name]  02/28/2022 11:15 AM    [facility] [place], [place], [place]  [address] Phone: [phone]; Fax: [phone]

## 2022-02-28 NOTE — Patient Instructions (Addendum)
Medication Instructions:  Your physician recommends that you continue on your current medications as directed. Please refer to the Current Medication list given to you today.  *If you need a refill on your cardiac medications before your next appointment, please call your pharmacy*   Lab Work: none If you have labs (blood work) drawn today and your tests are completely normal, you will receive your results only by: [place] (if you have MyChart) OR A paper copy in the mail If you have any lab test that is abnormal or we need to change your treatment, we will call you to review the results.   Testing/Procedures: Your physician has requested that you have cardiac CT. Cardiac computed tomography (CT) is a painless test that uses an x-ray machine to take clear, detailed pictures of your heart. For further information please visit [email or web address]. Please follow instruction sheet as given.     Follow-Up: At [facility], you and your health needs are our priority.  As part of our continuing mission to provide you with exceptional heart care, we have created designated Provider Care Teams.  These Care Teams include your primary Cardiologist (physician) and Advanced Practice Providers (APPs -  Physician Assistants and Nurse Practitioners) who all work together to provide you with the care you need, when you need it.  We recommend signing up for the patient portal called "MyChart".  Sign up information is provided on this After Visit Summary.  MyChart is used to connect with patients for Virtual Visits (Telemedicine).  Patients are able to view lab/test results, encounter notes, upcoming appointments, etc.  Non-urgent messages can be sent to your provider as well.   To learn more about what you can do with MyChart, go to [email or web address].    Your next appointment:   Based on results  The format for your next appointment:   In Person  Provider:   Larae Grooms,  MD     Other Instructions   Your cardiac CT will be scheduled at one of the below locations:   [facility] [address] [place], [place] [address] [phone]  [place] [address] [place], [place] [address] [phone]  [facility] [place], [place] [address] [phone]  If scheduled at [facility], please arrive at the [facility] and Children's Entrance (Entrance C2) of [facility] 30 minutes prior to test start time. You can use the FREE valet parking offered at entrance C (encouraged to control the heart rate for the test)  Proceed to the [facility] Radiology Department (first floor) to check-in and test prep.  All radiology patients and guests should use entrance C2 at [facility], accessed from [facility], even though the hospital's physical address listed is [address]    If scheduled at [facility] or [facility], please arrive 15 mins early for check-in and test prep.   Please follow these instructions carefully (unless otherwise directed):  Hold all erectile dysfunction medications at least 3 days (72 hrs) prior to test. (Ie viagra, cialis, sildenafil, tadalafil, etc) We will administer nitroglycerin during this exam.   On the Night Before the Test: Be sure to Drink plenty of water. Do not consume any caffeinated/decaffeinated beverages or chocolate 12 hours prior to your test. Do not take any antihistamines 12 hours prior to your test.  On the Day  of the Test: Drink plenty of water until 1 hour prior to the test. Do not eat any food 1 hour prior to test. You may take your regular medications prior to the test.  Take metoprolol (Lopressor) two hours prior to test. HOLD Furosemide/Hydrochlorothiazide morning of the test.       After the  Test: Drink plenty of water. After receiving IV contrast, you may experience a mild flushed feeling. This is normal. On occasion, you may experience a mild rash up to 24 hours after the test. This is not dangerous. If this occurs, you can take Benadryl 25 mg and increase your fluid intake. If you experience trouble breathing, this can be serious. If it is severe call 911 IMMEDIATELY. If it is mild, please call our office. If you take any of these medications: Glipizide/Metformin, Avandament, Glucavance, please do not take 48 hours after completing test unless otherwise instructed.  We will call to schedule your test 2-4 weeks out understanding that some insurance companies will need an authorization prior to the service being performed.   For non-scheduling related questions, please contact the cardiac imaging nurse navigator should you have any questions/concerns: Marchia Bond, Cardiac Imaging Nurse Navigator Gordy Clement, Cardiac Imaging Nurse Navigator [facility] Direct Office Dial: [phone]   For scheduling needs, including cancellations and rescheduling, please call [place], [phone].   Important Information About Sugar

## 2022-03-08 ENCOUNTER — Telehealth (HOSPITAL_COMMUNITY): Payer: Self-pay | Admitting: Emergency Medicine

## 2022-03-08 NOTE — Telephone Encounter (Signed)
Attempted to call patient regarding upcoming cardiac CT appointment. °Left message on voicemail with name and callback number °Madoc Holquin RN Navigator Cardiac Imaging °[facility] °[phone] Office °[phone] Cell ° °

## 2022-03-11 ENCOUNTER — Ambulatory Visit (HOSPITAL_COMMUNITY)
Admission: RE | Admit: 2022-03-11 | Discharge: 2022-03-11 | Disposition: A | Discharge status: Home / Self Care | Payer: Medicare Other | Source: Ambulatory Visit | Attending: Interventional Cardiology | Admitting: Interventional Cardiology

## 2022-03-11 DIAGNOSIS — I700 Atherosclerosis of aorta: Secondary | ICD-10-CM | POA: Diagnosis not present

## 2022-03-11 DIAGNOSIS — R072 Precordial pain: Secondary | ICD-10-CM | POA: Insufficient documentation

## 2022-03-11 MED ORDER — IOHEXOL 350 MG/ML SOLN
95.0000 mL | Freq: Once | INTRAVENOUS | Status: AC | PRN
  Administered 2022-03-11: 95 mL via INTRAVENOUS

## 2022-03-11 MED ORDER — NITROGLYCERIN 0.4 MG SL SUBL
0.8000 mg | SUBLINGUAL_TABLET | Freq: Once | SUBLINGUAL | Status: AC
  Administered 2022-03-11: 0.8 mg via SUBLINGUAL

## 2022-03-11 MED ORDER — NITROGLYCERIN 0.4 MG SL SUBL
SUBLINGUAL_TABLET | SUBLINGUAL | Status: AC
  Filled 2022-03-11: qty 2

## 2022-03-25 ENCOUNTER — Encounter: Payer: Self-pay | Admitting: Pulmonary Disease

## 2022-03-29 DIAGNOSIS — H40023 Open angle with borderline findings, high risk, bilateral: Secondary | ICD-10-CM | POA: Diagnosis not present

## 2022-03-29 DIAGNOSIS — H26493 Other secondary cataract, bilateral: Secondary | ICD-10-CM | POA: Diagnosis not present

## 2022-03-29 DIAGNOSIS — H52203 Unspecified astigmatism, bilateral: Secondary | ICD-10-CM | POA: Diagnosis not present

## 2022-03-29 DIAGNOSIS — H40053 Ocular hypertension, bilateral: Secondary | ICD-10-CM | POA: Diagnosis not present

## 2022-03-29 DIAGNOSIS — H21233 Degeneration of iris (pigmentary), bilateral: Secondary | ICD-10-CM | POA: Diagnosis not present

## 2022-04-01 ENCOUNTER — Encounter: Payer: Self-pay | Admitting: Pulmonary Disease

## 2022-04-01 ENCOUNTER — Ambulatory Visit (INDEPENDENT_AMBULATORY_CARE_PROVIDER_SITE_OTHER): Payer: Medicare Other

## 2022-04-01 VITALS — BP 142/83 | HR 86 | Temp 97.6°F | Resp 16 | Ht 69.0 in | Wt 187.4 lb

## 2022-04-01 DIAGNOSIS — M810 Age-related osteoporosis without current pathological fracture: Secondary | ICD-10-CM | POA: Diagnosis not present

## 2022-04-01 MED ORDER — DENOSUMAB 60 MG/ML ~~LOC~~ SOSY
60.0000 mg | PREFILLED_SYRINGE | Freq: Once | SUBCUTANEOUS | Status: AC
  Administered 2022-04-01: 60 mg via SUBCUTANEOUS

## 2022-04-01 NOTE — Progress Notes (Signed)
Diagnosis: Osteoporosis  Provider:  Marshell Garfinkel MD  Procedure: Injection  Prolia (Denosumab), Dose: 60 mg, Site: subcutaneous, Number of injections: 1  Post Care:  N/A  Discharge: Condition: Good, Destination: Home . AVS provided to patient.   Performed by:  Adelina Mings, LPN

## 2022-05-05 IMAGING — CT CT CHEST HIGH RESOLUTION W/O CM
1 of 3 series · 14 of 31 positions shown, 18 images · non-contrast
Comparison: CT abdomen pelvis 01/31/2011.

CLINICAL DATA: Scarring on x-ray.

EXAM:
CT CHEST WITHOUT CONTRAST
TECHNIQUE: Multidetector CT imaging of the chest was performed following the
standard protocol without intravenous contrast. High resolution
imaging of the lungs, as well as inspiratory and expiratory imaging,
was performed.

[Series 2: chest · axial · 0.77mm/px · z∈[-298,-30]mm · 14 of 148 slices shown, 18 images]
[im 7/148  mediastinal]
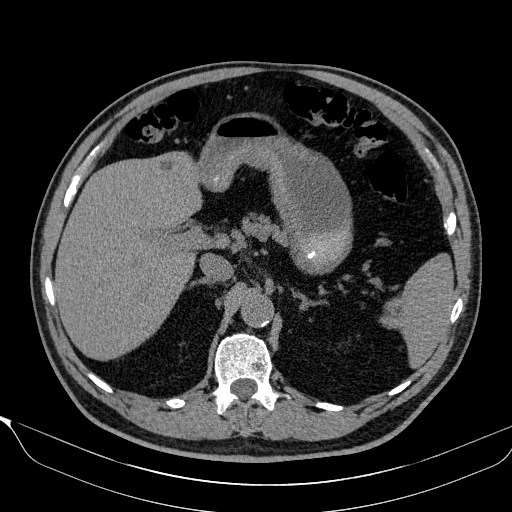
[im 7/148  lung]
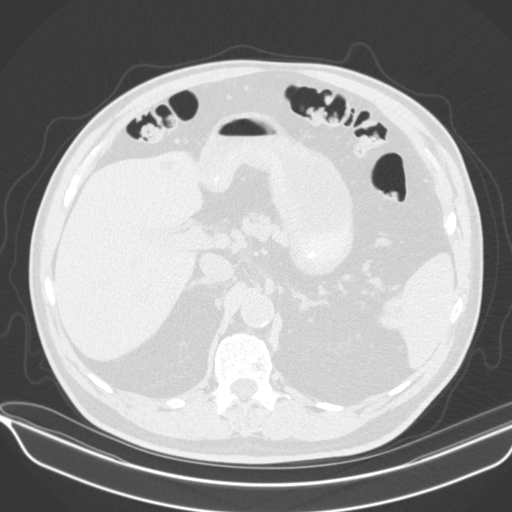
[im 21/148  lung]
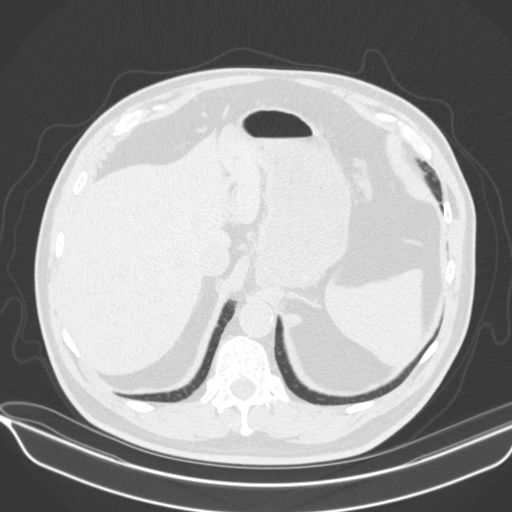
[im 34/148  lung]
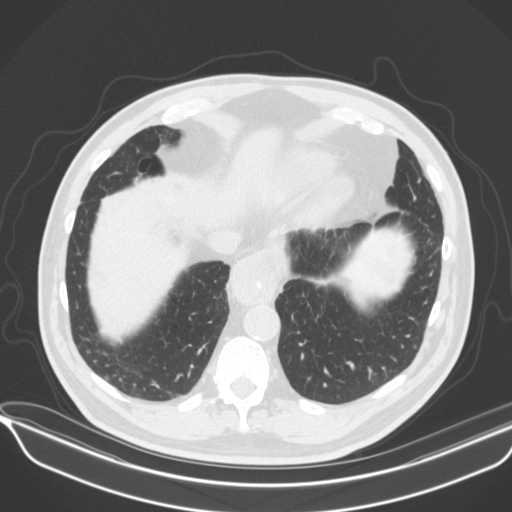
[im 47/148  lung]
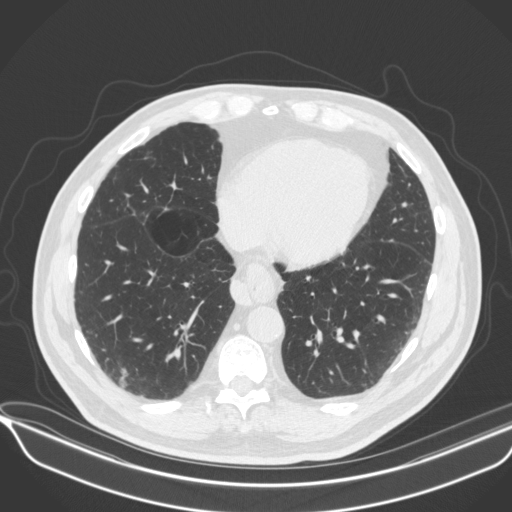
[im 50/148  mediastinal]
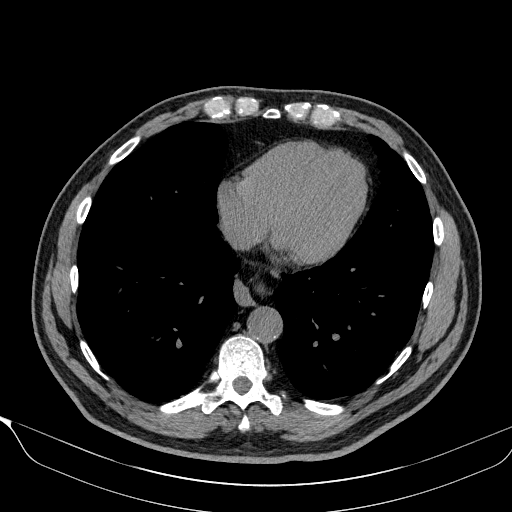
[im 50/148  lung]
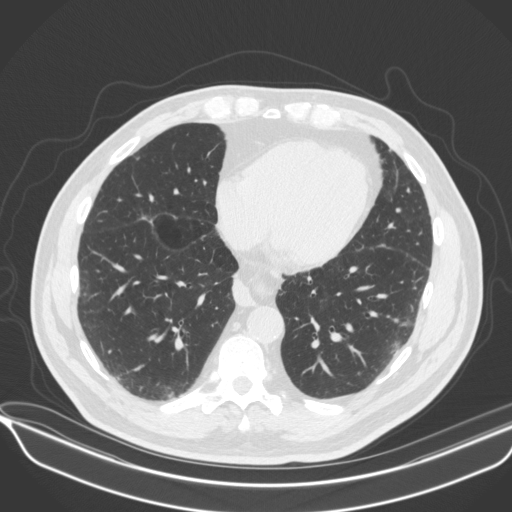
[im 61/148  lung]
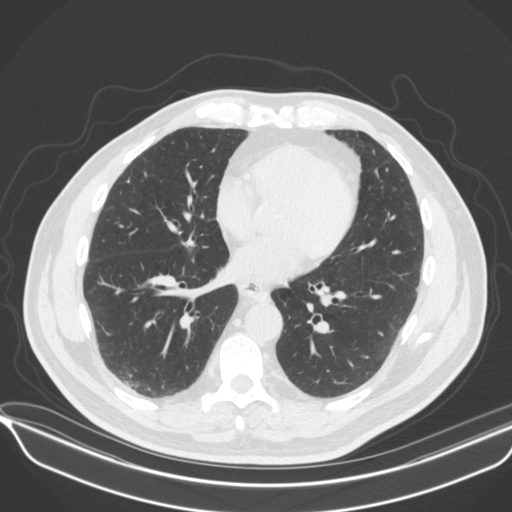
[im 70/148  lung]
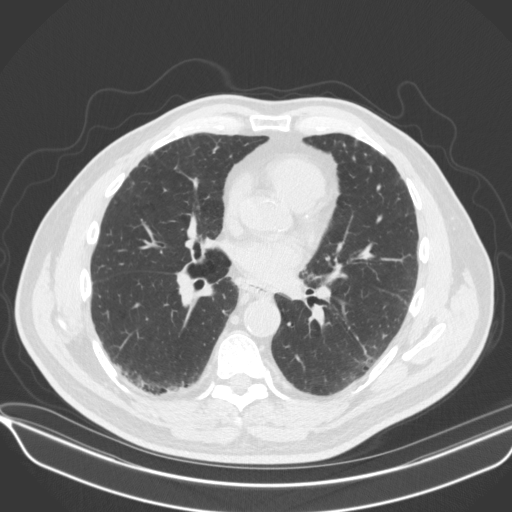
[im 74/148  lung]
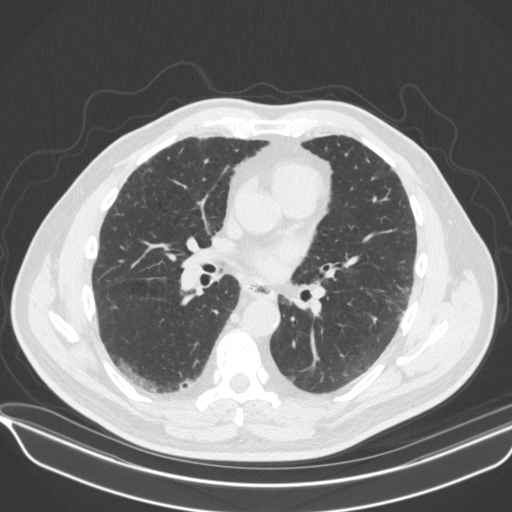
[im 87/148  mediastinal]
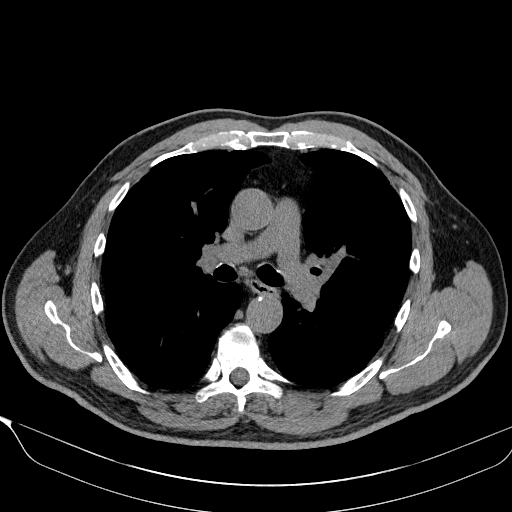
[im 87/148  lung]
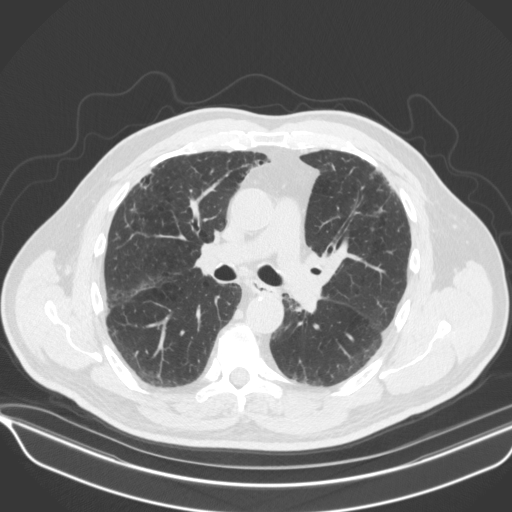
[im 99/148  lung]
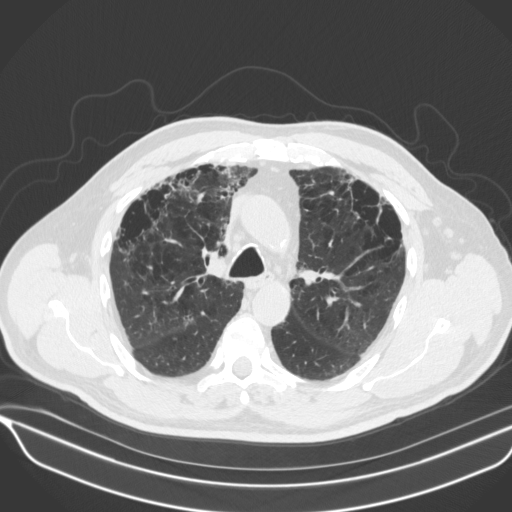
[im 107/148  lung]
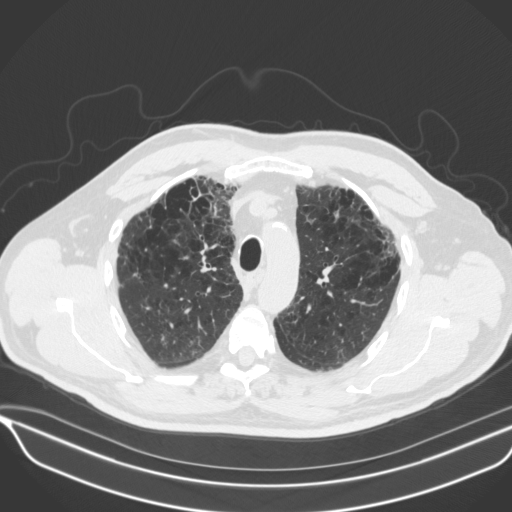
[im 114/148  lung]
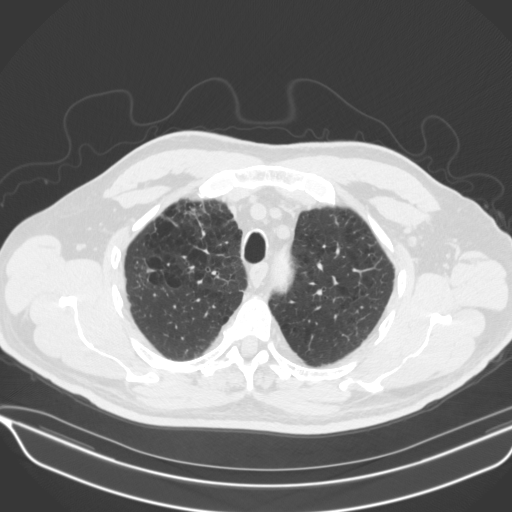
[im 127/148  mediastinal]
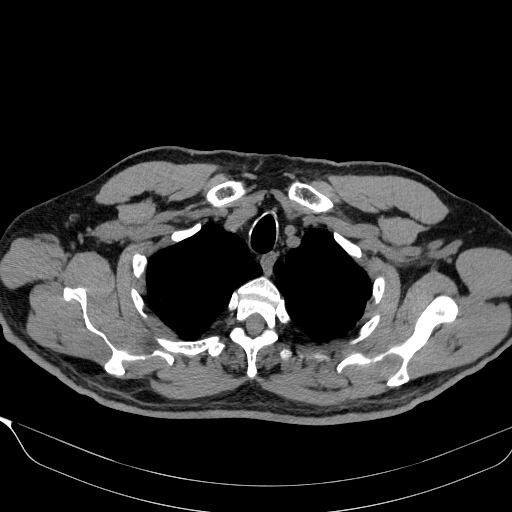
[im 127/148  lung]
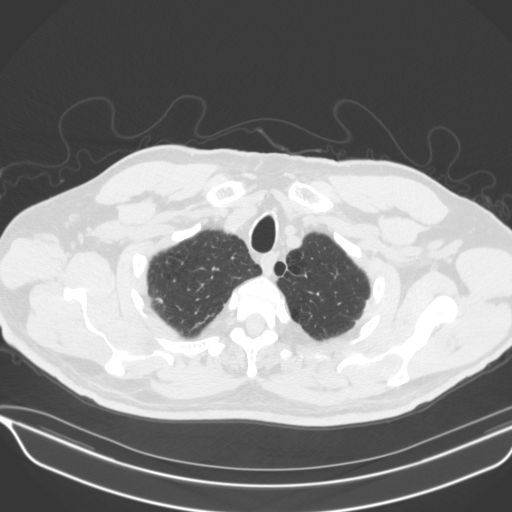
[im 141/148  lung]
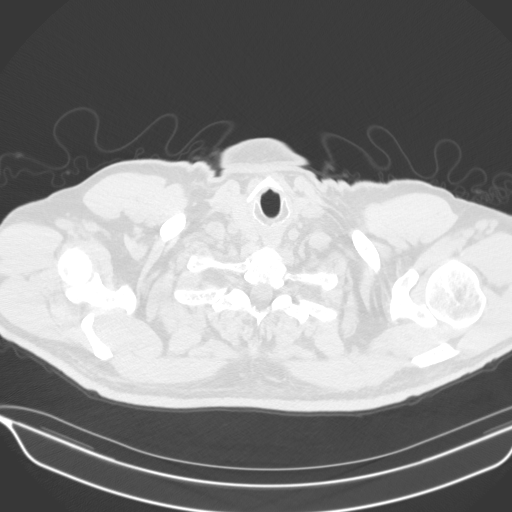

[14 of 31 positions shown; findings below may reference images not displayed]

FINDINGS: Cardiovascular: Atherosclerotic calcification of the aorta, aortic
valve and coronary arteries. Heart size normal. No pericardial
effusion.

Mediastinum/Nodes: No pathologically enlarged mediastinal lymph
nodes. Hilar regions are difficult to evaluate without IV contrast.
There are numerous axillary lymph nodes, left greater than right,
measuring up to 9 mm on the left. Esophagus is unremarkable. Small
hiatal hernia.

Lungs/Pleura: Biapical pleuroparenchymal scarring. Centrilobular and
paraseptal emphysema. Superimposed upper and midlung zone
predominant subpleural reticulation and ground-glass with traction
bronchiectasis/bronchiolectasis. Few tiny pulmonary nodules measure
3 mm or less in size. No pleural fluid. Airway is unremarkable. No
air trapping.

Upper Abdomen: Low-attenuation lesions in the liver measure up to
1.5 cm and are likely cysts. Cholecystectomy. Visualized portions of
the adrenal glands, left kidney, spleen, pancreas, stomach and bowel
are unremarkable with the exception of a small hiatal hernia.
Gastrohepatic ligament lymph nodes measure up to 8 mm (2/134), and
are largely new from 01/31/2011.

Musculoskeletal: No worrisome lytic or sclerotic lesions.
IMPRESSION: 1. Pulmonary parenchymal pattern of interstitial lung disease may be
due to nonspecific interstitial pneumonitis or usual interstitial
pneumonitis. Findings are indeterminate for UIP per consensus
guidelines: Diagnosis of Idiopathic Pulmonary Fibrosis: An Official
ATS/ERS/JRS/ALAT Clinical Practice Guideline. Am J Respir Crit Care
Med Vol 198, Julmisteastride 5, ppe99-e[DATE].
2. Greater than expected number of axillary and gastrohepatic
ligament lymph nodes, none of which meets CT size criteria for
pathologic enlargement. However, it is difficult to exclude a
lymphoproliferative disorder.
3. Pulmonary nodules measure 3 mm or less in size. No follow-up
needed if patient is low-risk (and has no known or suspected primary
neoplasm). Non-contrast chest CT can be considered in 12 months if
patient is high-risk. This recommendation follows the consensus
statement: Guidelines for Management of Incidental Pulmonary Nodules
Detected on CT Images: From the [HOSPITAL] 1514; Radiology
1514; [DATE].
4. Aortic atherosclerosis (VV647-3NM.M). Coronary artery
calcification.
5.  Emphysema (VV647-WVZ.Q).

## 2022-08-30 DIAGNOSIS — N1831 Chronic kidney disease, stage 3a: Secondary | ICD-10-CM | POA: Diagnosis not present

## 2022-08-30 DIAGNOSIS — Z889 Allergy status to unspecified drugs, medicaments and biological substances status: Secondary | ICD-10-CM | POA: Diagnosis not present

## 2022-08-30 DIAGNOSIS — J8410 Pulmonary fibrosis, unspecified: Secondary | ICD-10-CM | POA: Diagnosis not present

## 2022-08-30 DIAGNOSIS — E291 Testicular hypofunction: Secondary | ICD-10-CM | POA: Diagnosis not present

## 2022-08-30 DIAGNOSIS — E1169 Type 2 diabetes mellitus with other specified complication: Secondary | ICD-10-CM | POA: Diagnosis not present

## 2022-08-30 DIAGNOSIS — K219 Gastro-esophageal reflux disease without esophagitis: Secondary | ICD-10-CM | POA: Diagnosis not present

## 2022-08-30 DIAGNOSIS — G4733 Obstructive sleep apnea (adult) (pediatric): Secondary | ICD-10-CM | POA: Diagnosis not present

## 2022-08-30 DIAGNOSIS — R079 Chest pain, unspecified: Secondary | ICD-10-CM | POA: Diagnosis not present

## 2022-08-30 DIAGNOSIS — I129 Hypertensive chronic kidney disease with stage 1 through stage 4 chronic kidney disease, or unspecified chronic kidney disease: Secondary | ICD-10-CM | POA: Diagnosis not present

## 2022-08-30 DIAGNOSIS — E039 Hypothyroidism, unspecified: Secondary | ICD-10-CM | POA: Diagnosis not present

## 2022-08-30 DIAGNOSIS — D696 Thrombocytopenia, unspecified: Secondary | ICD-10-CM | POA: Diagnosis not present

## 2022-08-30 DIAGNOSIS — E785 Hyperlipidemia, unspecified: Secondary | ICD-10-CM | POA: Diagnosis not present

## 2022-09-10 DIAGNOSIS — E785 Hyperlipidemia, unspecified: Secondary | ICD-10-CM | POA: Diagnosis not present

## 2022-09-10 DIAGNOSIS — K219 Gastro-esophageal reflux disease without esophagitis: Secondary | ICD-10-CM | POA: Diagnosis not present

## 2022-09-10 DIAGNOSIS — E1169 Type 2 diabetes mellitus with other specified complication: Secondary | ICD-10-CM | POA: Diagnosis not present

## 2022-09-10 DIAGNOSIS — I10 Essential (primary) hypertension: Secondary | ICD-10-CM | POA: Diagnosis not present

## 2022-09-10 DIAGNOSIS — E039 Hypothyroidism, unspecified: Secondary | ICD-10-CM | POA: Diagnosis not present

## 2022-09-10 DIAGNOSIS — D649 Anemia, unspecified: Secondary | ICD-10-CM | POA: Diagnosis not present

## 2022-09-10 DIAGNOSIS — E291 Testicular hypofunction: Secondary | ICD-10-CM | POA: Diagnosis not present

## 2022-09-30 ENCOUNTER — Ambulatory Visit: Payer: Medicare Other

## 2022-10-01 ENCOUNTER — Ambulatory Visit (INDEPENDENT_AMBULATORY_CARE_PROVIDER_SITE_OTHER): Payer: Medicare Other

## 2022-10-01 VITALS — BP 126/70 | HR 52 | Temp 98.1°F | Resp 20 | Ht 69.0 in | Wt 180.8 lb

## 2022-10-01 DIAGNOSIS — I129 Hypertensive chronic kidney disease with stage 1 through stage 4 chronic kidney disease, or unspecified chronic kidney disease: Secondary | ICD-10-CM | POA: Diagnosis not present

## 2022-10-01 DIAGNOSIS — E611 Iron deficiency: Secondary | ICD-10-CM | POA: Diagnosis not present

## 2022-10-01 DIAGNOSIS — H401331 Pigmentary glaucoma, bilateral, mild stage: Secondary | ICD-10-CM | POA: Diagnosis not present

## 2022-10-01 DIAGNOSIS — M810 Age-related osteoporosis without current pathological fracture: Secondary | ICD-10-CM

## 2022-10-01 DIAGNOSIS — E1169 Type 2 diabetes mellitus with other specified complication: Secondary | ICD-10-CM | POA: Diagnosis not present

## 2022-10-01 DIAGNOSIS — H26493 Other secondary cataract, bilateral: Secondary | ICD-10-CM | POA: Diagnosis not present

## 2022-10-01 DIAGNOSIS — Z961 Presence of intraocular lens: Secondary | ICD-10-CM | POA: Diagnosis not present

## 2022-10-01 DIAGNOSIS — D649 Anemia, unspecified: Secondary | ICD-10-CM | POA: Diagnosis not present

## 2022-10-01 DIAGNOSIS — H5000 Unspecified esotropia: Secondary | ICD-10-CM | POA: Diagnosis not present

## 2022-10-01 DIAGNOSIS — E785 Hyperlipidemia, unspecified: Secondary | ICD-10-CM | POA: Diagnosis not present

## 2022-10-01 DIAGNOSIS — E039 Hypothyroidism, unspecified: Secondary | ICD-10-CM | POA: Diagnosis not present

## 2022-10-01 DIAGNOSIS — E291 Testicular hypofunction: Secondary | ICD-10-CM | POA: Diagnosis not present

## 2022-10-01 MED ORDER — DENOSUMAB 60 MG/ML ~~LOC~~ SOSY
60.0000 mg | PREFILLED_SYRINGE | Freq: Once | SUBCUTANEOUS | Status: AC
  Administered 2022-10-01: 60 mg via SUBCUTANEOUS
  Filled 2022-10-01: qty 1

## 2022-10-01 NOTE — Progress Notes (Signed)
Diagnosis: Osteoporosis  Provider:  Mannam, Praveen MD  Procedure: Injection  Prolia (Denosumab), Dose: 60 mg, Site: subcutaneous, Number of injections: 1   Discharge: Condition: Good, Destination: Home . AVS Provided  Performed by:  Seva Chancy, RN       

## 2022-11-20 DIAGNOSIS — L570 Actinic keratosis: Secondary | ICD-10-CM | POA: Diagnosis not present

## 2022-11-20 DIAGNOSIS — L812 Freckles: Secondary | ICD-10-CM | POA: Diagnosis not present

## 2022-11-20 DIAGNOSIS — L821 Other seborrheic keratosis: Secondary | ICD-10-CM | POA: Diagnosis not present

## 2022-11-20 DIAGNOSIS — D1801 Hemangioma of skin and subcutaneous tissue: Secondary | ICD-10-CM | POA: Diagnosis not present

## 2023-01-21 DIAGNOSIS — Z23 Encounter for immunization: Secondary | ICD-10-CM | POA: Diagnosis not present

## 2023-01-28 DIAGNOSIS — J432 Centrilobular emphysema: Secondary | ICD-10-CM | POA: Diagnosis not present

## 2023-01-28 DIAGNOSIS — R911 Solitary pulmonary nodule: Secondary | ICD-10-CM | POA: Diagnosis not present

## 2023-01-28 DIAGNOSIS — I288 Other diseases of pulmonary vessels: Secondary | ICD-10-CM | POA: Diagnosis not present

## 2023-01-28 DIAGNOSIS — J849 Interstitial pulmonary disease, unspecified: Secondary | ICD-10-CM | POA: Diagnosis not present

## 2023-02-11 DIAGNOSIS — Z23 Encounter for immunization: Secondary | ICD-10-CM | POA: Diagnosis not present

## 2023-03-05 DIAGNOSIS — J984 Other disorders of lung: Secondary | ICD-10-CM | POA: Diagnosis not present

## 2023-03-05 DIAGNOSIS — Z87891 Personal history of nicotine dependence: Secondary | ICD-10-CM | POA: Diagnosis not present

## 2023-03-05 DIAGNOSIS — R911 Solitary pulmonary nodule: Secondary | ICD-10-CM | POA: Diagnosis not present

## 2023-03-05 DIAGNOSIS — R918 Other nonspecific abnormal finding of lung field: Secondary | ICD-10-CM | POA: Diagnosis not present

## 2023-03-05 DIAGNOSIS — J439 Emphysema, unspecified: Secondary | ICD-10-CM | POA: Diagnosis not present

## 2023-03-05 DIAGNOSIS — J849 Interstitial pulmonary disease, unspecified: Secondary | ICD-10-CM | POA: Diagnosis not present

## 2023-03-05 DIAGNOSIS — R591 Generalized enlarged lymph nodes: Secondary | ICD-10-CM | POA: Diagnosis not present

## 2023-03-05 DIAGNOSIS — I700 Atherosclerosis of aorta: Secondary | ICD-10-CM | POA: Diagnosis not present

## 2023-03-11 ENCOUNTER — Emergency Department (HOSPITAL_COMMUNITY): Payer: Medicare Other

## 2023-03-11 ENCOUNTER — Other Ambulatory Visit: Payer: Self-pay

## 2023-03-11 ENCOUNTER — Inpatient Hospital Stay (HOSPITAL_COMMUNITY)
Admission: EM | Admit: 2023-03-11 | Discharge: 2023-03-14 | DRG: 193 | Disposition: A | Discharge status: Home / Self Care | Payer: Medicare Other | Source: Ambulatory Visit | Attending: Family Medicine | Admitting: Family Medicine

## 2023-03-11 DIAGNOSIS — Z823 Family history of stroke: Secondary | ICD-10-CM

## 2023-03-11 DIAGNOSIS — R0602 Shortness of breath: Secondary | ICD-10-CM | POA: Diagnosis not present

## 2023-03-11 DIAGNOSIS — J439 Emphysema, unspecified: Secondary | ICD-10-CM | POA: Diagnosis present

## 2023-03-11 DIAGNOSIS — J9621 Acute and chronic respiratory failure with hypoxia: Secondary | ICD-10-CM | POA: Diagnosis present

## 2023-03-11 DIAGNOSIS — K219 Gastro-esophageal reflux disease without esophagitis: Secondary | ICD-10-CM | POA: Diagnosis present

## 2023-03-11 DIAGNOSIS — Z888 Allergy status to other drugs, medicaments and biological substances status: Secondary | ICD-10-CM

## 2023-03-11 DIAGNOSIS — M810 Age-related osteoporosis without current pathological fracture: Secondary | ICD-10-CM | POA: Diagnosis present

## 2023-03-11 DIAGNOSIS — Z1152 Encounter for screening for COVID-19: Secondary | ICD-10-CM

## 2023-03-11 DIAGNOSIS — H409 Unspecified glaucoma: Secondary | ICD-10-CM | POA: Diagnosis present

## 2023-03-11 DIAGNOSIS — J440 Chronic obstructive pulmonary disease with acute lower respiratory infection: Secondary | ICD-10-CM | POA: Diagnosis present

## 2023-03-11 DIAGNOSIS — J441 Chronic obstructive pulmonary disease with (acute) exacerbation: Secondary | ICD-10-CM | POA: Diagnosis not present

## 2023-03-11 DIAGNOSIS — Z79890 Hormone replacement therapy (postmenopausal): Secondary | ICD-10-CM | POA: Diagnosis not present

## 2023-03-11 DIAGNOSIS — Z79899 Other long term (current) drug therapy: Secondary | ICD-10-CM

## 2023-03-11 DIAGNOSIS — E039 Hypothyroidism, unspecified: Secondary | ICD-10-CM | POA: Diagnosis present

## 2023-03-11 DIAGNOSIS — J849 Interstitial pulmonary disease, unspecified: Secondary | ICD-10-CM | POA: Diagnosis not present

## 2023-03-11 DIAGNOSIS — J189 Pneumonia, unspecified organism: Secondary | ICD-10-CM | POA: Diagnosis not present

## 2023-03-11 DIAGNOSIS — E785 Hyperlipidemia, unspecified: Secondary | ICD-10-CM | POA: Diagnosis present

## 2023-03-11 DIAGNOSIS — J84112 Idiopathic pulmonary fibrosis: Secondary | ICD-10-CM | POA: Diagnosis not present

## 2023-03-11 DIAGNOSIS — I129 Hypertensive chronic kidney disease with stage 1 through stage 4 chronic kidney disease, or unspecified chronic kidney disease: Secondary | ICD-10-CM | POA: Diagnosis present

## 2023-03-11 DIAGNOSIS — Z87891 Personal history of nicotine dependence: Secondary | ICD-10-CM | POA: Diagnosis not present

## 2023-03-11 DIAGNOSIS — J9601 Acute respiratory failure with hypoxia: Secondary | ICD-10-CM | POA: Diagnosis not present

## 2023-03-11 DIAGNOSIS — N1832 Chronic kidney disease, stage 3b: Secondary | ICD-10-CM | POA: Diagnosis present

## 2023-03-11 DIAGNOSIS — I517 Cardiomegaly: Secondary | ICD-10-CM | POA: Diagnosis not present

## 2023-03-11 DIAGNOSIS — R0902 Hypoxemia: Secondary | ICD-10-CM

## 2023-03-11 DIAGNOSIS — J432 Centrilobular emphysema: Secondary | ICD-10-CM | POA: Diagnosis not present

## 2023-03-11 DIAGNOSIS — G4733 Obstructive sleep apnea (adult) (pediatric): Secondary | ICD-10-CM | POA: Diagnosis present

## 2023-03-11 LAB — COMPREHENSIVE METABOLIC PANEL
ALT: 19 U/L (ref 0–44)
AST: 25 U/L (ref 15–41)
Albumin: 2.7 g/dL — ABNORMAL LOW (ref 3.5–5.0)
Alkaline Phosphatase: 53 U/L (ref 38–126)
Anion gap: 9 (ref 5–15)
BUN: 18 mg/dL (ref 8–23)
CO2: 17 mmol/L — ABNORMAL LOW (ref 22–32)
Calcium: 8.7 mg/dL — ABNORMAL LOW (ref 8.9–10.3)
Chloride: 105 mmol/L (ref 98–111)
Creatinine, Ser: 1.58 mg/dL — ABNORMAL HIGH (ref 0.61–1.24)
GFR, Estimated: 44 mL/min — ABNORMAL LOW (ref >60)
Glucose, Bld: 162 mg/dL — ABNORMAL HIGH (ref 70–99)
Potassium: 3.8 mmol/L (ref 3.5–5.1)
Sodium: 131 mmol/L — ABNORMAL LOW (ref 135–145)
Total Bilirubin: 0.9 mg/dL (ref <1.2)
Total Protein: 7 g/dL (ref 6.5–8.1)

## 2023-03-11 LAB — RESP PANEL BY RT-PCR (RSV, FLU A&B, COVID)  RVPGX2
Influenza A by PCR: NEGATIVE
Influenza B by PCR: NEGATIVE
Resp Syncytial Virus by PCR: NEGATIVE
SARS Coronavirus 2 by RT PCR: NEGATIVE

## 2023-03-11 LAB — CBC WITH DIFFERENTIAL/PLATELET
Abs Immature Granulocytes: 0.09 10*3/uL — ABNORMAL HIGH (ref 0.00–0.07)
Basophils Absolute: 0.1 10*3/uL (ref 0.0–0.1)
Basophils Relative: 1 %
Eosinophils Absolute: 0.2 10*3/uL (ref 0.0–0.5)
Eosinophils Relative: 2 %
HCT: 43.5 % (ref 39.0–52.0)
Hemoglobin: 14.2 g/dL (ref 13.0–17.0)
Immature Granulocytes: 1 %
Lymphocytes Relative: 5 %
Lymphs Abs: 0.7 10*3/uL (ref 0.7–4.0)
MCH: 28.5 pg (ref 26.0–34.0)
MCHC: 32.6 g/dL (ref 30.0–36.0)
MCV: 87.2 fL (ref 80.0–100.0)
Monocytes Absolute: 0.7 10*3/uL (ref 0.1–1.0)
Monocytes Relative: 5 %
Neutro Abs: 13 10*3/uL — ABNORMAL HIGH (ref 1.7–7.7)
Neutrophils Relative %: 86 %
Platelets: 349 10*3/uL (ref 150–400)
RBC: 4.99 MIL/uL (ref 4.22–5.81)
RDW: 15 % (ref 11.5–15.5)
WBC: 14.8 10*3/uL — ABNORMAL HIGH (ref 4.0–10.5)
nRBC: 0 % (ref 0.0–0.2)

## 2023-03-11 LAB — TROPONIN I (HIGH SENSITIVITY): Troponin I (High Sensitivity): 17 ng/L (ref <18)

## 2023-03-11 MED ORDER — SODIUM CHLORIDE 0.9 % IV SOLN
1.0000 g | Freq: Once | INTRAVENOUS | Status: AC
  Administered 2023-03-11: 1 g via INTRAVENOUS
  Filled 2023-03-11: qty 10

## 2023-03-11 MED ORDER — SODIUM CHLORIDE 0.9 % IV SOLN
500.0000 mg | Freq: Once | INTRAVENOUS | Status: AC
  Administered 2023-03-12: 500 mg via INTRAVENOUS
  Filled 2023-03-11 (×3): qty 5

## 2023-03-11 NOTE — ED Triage Notes (Signed)
Pt BIB EMS from urgent care. C/o SHOB, home sat were low- High 70s RA, 97% non re breather    [facility] admin 125 solumedrol IM, duoneb,  EMS VS 130/78, HR 82, 97% NRB

## 2023-03-11 NOTE — ED Provider Notes (Cosign Needed Addendum)
[place] EMERGENCY DEPARTMENT AT [facility] Provider Note   CSN: [id number] Arrival date & time: 03/11/23  2214     History  Chief Complaint  Patient presents with   Shortness of Breath    Douglas [name] is a 82 y.o. male, hx of interstitial pulmonary fibrosis, who presents to the ED 2/2 to severe shortness of breath that's been going on for the last few days. Has pulmonary fibrosis, typically has O2 sats in the 90s. Shortness of breath, has been going on for the last few days, progressively getting worse.  Denies any fevers, chills, nausea, vomiting, or chest pain.  States that he went to urgent care today, and was told to come here, since his oxygen saturations were in the 70s.  Home Medications Prior to Admission medications   Medication Sig Start Date End Date Taking? Authorizing Provider  amLODipine (NORVASC) 5 MG tablet Take 1 tablet by mouth daily.    [provider]  cetirizine (ZYRTEC) 5 MG tablet Take 10 mg by mouth daily.     [provider]  cholecalciferol (VITAMIN D) 1000 UNITS tablet Take 1,000 Units by mouth 2 (two) times daily.    [provider]  denosumab (PROLIA) 60 MG/ML SOSY injection     [provider]  Dorzolamide HCl-Timolol Mal PF 2-0.5 % SOLN Apply 2 drops to eye daily.    [provider]  EPINEPHrine 0.3 mg/0.3 mL IJ SOAJ injection Inject 0.3 mg into the muscle as needed for anaphylaxis.    [provider]  ferrous sulfate 325 (65 FE) MG tablet as directed Orally Once a day Patient not taking: Reported on 03/11/2022    [provider]  fish oil-omega-3 fatty acids 1000 MG capsule Take 1 g by mouth 4 (four) times daily.     [provider]  hydrochlorothiazide (HYDRODIURIL) 25 MG tablet  01/31/22   [provider]  levothyroxine (SYNTHROID) 125 MCG tablet Take 125 mcg by mouth every morning. 06/14/20   [provider]  Magnesium 500 MG CAPS qd Oral 06/08/09    [provider]  metoprolol tartrate (LOPRESSOR) 50 MG tablet Take one tablet by mouth 2 hours prior to CT Scan 02/28/22   Corky Crafts, MD  metroNIDAZOLE (METROCREAM) 0.75 % cream Apply a thin layer to affected areas twice daily after washing External 02/03/13   [provider]  Multiple Vitamin (MULTIVITAMIN PO) Take by mouth daily.      [provider]  OVER THE COUNTER MEDICATION Magnesium 500mg  po daily    [provider]  pantoprazole (PROTONIX) 40 MG tablet Take 40 mg by mouth daily.    [provider]  simvastatin (ZOCOR) 40 MG tablet Take 40 mg by mouth every evening.    [provider]  telmisartan (MICARDIS) 80 MG tablet Take 80 mg by mouth daily.    [provider]  testosterone cypionate (DEPOTESTOTERONE CYPIONATE) 100 MG/ML injection Inject into the muscle every 21 ( twenty-one) days. For IM use only    [provider]      Allergies    Alpha-gal and Other    Review of Systems   Review of Systems  Respiratory:  Positive for shortness of breath.   Cardiovascular:  Negative for chest pain.    Physical Exam Updated Vital Signs BP 119/64   Pulse 66   Temp 99 F (37.2 C) (Oral)   Resp (!) 22   Ht 5\' 10"  (1.778 m)  Wt 75.8 kg   SpO2 96%   BMI 23.96 kg/m  Physical Exam Vitals and nursing note reviewed.  Constitutional:      General: He is not in acute distress.    Appearance: He is well-developed.  HENT:     Head: Normocephalic and atraumatic.  Eyes:     Conjunctiva/sclera: Conjunctivae normal.  Cardiovascular:     Rate and Rhythm: Normal rate and regular rhythm.     Heart sounds: No murmur heard. Pulmonary:     Effort: Pulmonary effort is normal. No respiratory distress.     Breath sounds: Normal breath sounds.     Comments: +crackles in RLL Abdominal:     Palpations: Abdomen is soft.     Tenderness: There is no abdominal tenderness.  Musculoskeletal:        General: No  swelling.     Cervical back: Neck supple.  Skin:    General: Skin is warm and dry.     Capillary Refill: Capillary refill takes less than 2 seconds.  Neurological:     Mental Status: He is alert.  Psychiatric:        Mood and Affect: Mood normal.     ED Results / Procedures / Treatments   Labs (all labs ordered are listed, but only abnormal results are displayed) Labs Reviewed  CBC WITH DIFFERENTIAL/PLATELET - Abnormal; Notable for the following components:      Result Value   WBC 14.8 (*)    Neutro Abs 13.0 (*)    Abs Immature Granulocytes 0.09 (*)    All other components within normal limits  COMPREHENSIVE METABOLIC PANEL - Abnormal; Notable for the following components:   Sodium 131 (*)    CO2 17 (*)    Glucose, Bld 162 (*)    Creatinine, Ser 1.58 (*)    Calcium 8.7 (*)    Albumin 2.7 (*)    GFR, Estimated 44 (*)    All other components within normal limits  RESP PANEL BY RT-PCR (RSV, FLU A&B, COVID)  [id number]  LACTIC ACID, PLASMA  LACTIC ACID, PLASMA  TROPONIN I (HIGH SENSITIVITY)  TROPONIN I (HIGH SENSITIVITY)    EKG None  Radiology DG CHEST PORT 1 VIEW Result Date: 03/11/2023 CLINICAL DATA:  Shortness of breath and hypoxia EXAM: PORTABLE CHEST 1 VIEW COMPARISON:  06/15/2010 radiograph and 01/18/2022 CTA chest FINDINGS: Bilateral hazy airspace and interstitial opacities with confluence airspace opacity in the left mid lung laterally. Cardiomegaly. No pleural effusion or pneumothorax. No displaced rib fractures. IMPRESSION: Findings favor multifocal pneumonia. Electronically Signed   By: Minerva Fester M.D.   On: 03/11/2023 23:06    Procedures Procedures    Medications Ordered in ED Medications  cefTRIAXone (ROCEPHIN) 1 g in sodium chloride 0.9 % 100 mL IVPB (1 g Intravenous New Bag/Given 03/11/23 2341)  azithromycin (ZITHROMAX) 500 mg in sodium chloride 0.9 % 250 mL IVPB (has no administration in time range)    ED Course/ Medical Decision Making/ A&P                                  Medical Decision Making Patient is a 82 year old male, history of interstitial pulmonary fibrosis, who presents to the ED secondary to hypoxia for the last few days.  Found to have oxygen saturations in the 70s.  Was given Solu-Medrol, as well as DuoNeb, in urgent care and sent here.  He has right lower lobe crackles,  will start with chest x-ray, blood work for further evaluation.  He has a temperature of 100 F, suspicious for viral versus bacterial pneumonia given hypoxia.  Amount and/or Complexity of Data Reviewed Labs: ordered.    Details: Leukocytosis of 14K Radiology: ordered.    Details: Chest x-ray shows concern for multifocal pneumonia Discussion of management or test interpretation with external provider(s): Discussed with patient, Dr. Clayborne Dana, patient will be admitted for multifocal focal pneumonia, secondary to hypoxia, need for IV antibiotics, and further weaning of the oxygen.  He is agreeable on plan.  Handed off to Maryanna Shape PA, for admission to hospitalist  CRITICAL CARE Performed by: Pete Pelt   Total critical care time: 30 minutes  Critical care time was exclusive of separately billable procedures and treating other patients.  Critical care was necessary to treat or prevent imminent or life-threatening deterioration.  Critical care was time spent personally by me on the following activities: development of treatment plan with patient and/or surrogate as well as nursing, discussions with consultants, evaluation of patient's response to treatment, examination of patient, obtaining history from patient or surrogate, ordering and performing treatments and interventions, ordering and review of laboratory studies, ordering and review of radiographic studies, pulse oximetry and re-evaluation of patient's condition.   Risk Decision regarding hospitalization.    Final Clinical Impression(s) / ED Diagnoses Final diagnoses:  Multifocal  pneumonia  Hypoxia    Rx / DC Orders ED Discharge Orders     None         Dolphus Jenny, Harley [place], PA 03/12/23 0004    Marily Memos, MD 03/12/23 0640    Rossi Burdo, Harley [place], PA 03/23/23 2019

## 2023-03-12 ENCOUNTER — Encounter (HOSPITAL_COMMUNITY): Payer: Self-pay | Admitting: Internal Medicine

## 2023-03-12 DIAGNOSIS — G4733 Obstructive sleep apnea (adult) (pediatric): Secondary | ICD-10-CM | POA: Diagnosis present

## 2023-03-12 DIAGNOSIS — Z888 Allergy status to other drugs, medicaments and biological substances status: Secondary | ICD-10-CM | POA: Diagnosis not present

## 2023-03-12 DIAGNOSIS — J84112 Idiopathic pulmonary fibrosis: Secondary | ICD-10-CM

## 2023-03-12 DIAGNOSIS — Z79890 Hormone replacement therapy (postmenopausal): Secondary | ICD-10-CM | POA: Diagnosis not present

## 2023-03-12 DIAGNOSIS — R0902 Hypoxemia: Secondary | ICD-10-CM | POA: Diagnosis present

## 2023-03-12 DIAGNOSIS — E039 Hypothyroidism, unspecified: Secondary | ICD-10-CM | POA: Diagnosis present

## 2023-03-12 DIAGNOSIS — J189 Pneumonia, unspecified organism: Principal | ICD-10-CM | POA: Diagnosis present

## 2023-03-12 DIAGNOSIS — Z79899 Other long term (current) drug therapy: Secondary | ICD-10-CM | POA: Diagnosis not present

## 2023-03-12 DIAGNOSIS — I129 Hypertensive chronic kidney disease with stage 1 through stage 4 chronic kidney disease, or unspecified chronic kidney disease: Secondary | ICD-10-CM | POA: Diagnosis present

## 2023-03-12 DIAGNOSIS — Z87891 Personal history of nicotine dependence: Secondary | ICD-10-CM | POA: Diagnosis not present

## 2023-03-12 DIAGNOSIS — J9601 Acute respiratory failure with hypoxia: Secondary | ICD-10-CM

## 2023-03-12 DIAGNOSIS — J441 Chronic obstructive pulmonary disease with (acute) exacerbation: Secondary | ICD-10-CM | POA: Diagnosis not present

## 2023-03-12 DIAGNOSIS — J439 Emphysema, unspecified: Secondary | ICD-10-CM | POA: Diagnosis present

## 2023-03-12 DIAGNOSIS — H409 Unspecified glaucoma: Secondary | ICD-10-CM | POA: Diagnosis present

## 2023-03-12 DIAGNOSIS — K219 Gastro-esophageal reflux disease without esophagitis: Secondary | ICD-10-CM | POA: Diagnosis present

## 2023-03-12 DIAGNOSIS — J432 Centrilobular emphysema: Secondary | ICD-10-CM | POA: Diagnosis not present

## 2023-03-12 DIAGNOSIS — Z1152 Encounter for screening for COVID-19: Secondary | ICD-10-CM | POA: Diagnosis not present

## 2023-03-12 DIAGNOSIS — J440 Chronic obstructive pulmonary disease with acute lower respiratory infection: Secondary | ICD-10-CM | POA: Diagnosis present

## 2023-03-12 DIAGNOSIS — J849 Interstitial pulmonary disease, unspecified: Secondary | ICD-10-CM | POA: Diagnosis not present

## 2023-03-12 DIAGNOSIS — Z823 Family history of stroke: Secondary | ICD-10-CM | POA: Diagnosis not present

## 2023-03-12 DIAGNOSIS — M810 Age-related osteoporosis without current pathological fracture: Secondary | ICD-10-CM | POA: Diagnosis present

## 2023-03-12 DIAGNOSIS — E785 Hyperlipidemia, unspecified: Secondary | ICD-10-CM | POA: Diagnosis present

## 2023-03-12 DIAGNOSIS — J9621 Acute and chronic respiratory failure with hypoxia: Secondary | ICD-10-CM | POA: Diagnosis present

## 2023-03-12 DIAGNOSIS — N1832 Chronic kidney disease, stage 3b: Secondary | ICD-10-CM | POA: Diagnosis present

## 2023-03-12 LAB — RESPIRATORY PANEL BY PCR

## 2023-03-12 LAB — CBC
HCT: 43.5 % (ref 39.0–52.0)
Hemoglobin: 14.5 g/dL (ref 13.0–17.0)
MCH: 28.9 pg (ref 26.0–34.0)
MCHC: 33.3 g/dL (ref 30.0–36.0)
MCV: 86.7 fL (ref 80.0–100.0)
Platelets: 356 10*3/uL (ref 150–400)
RBC: 5.02 MIL/uL (ref 4.22–5.81)
RDW: 15 % (ref 11.5–15.5)
WBC: 14.1 10*3/uL — ABNORMAL HIGH (ref 4.0–10.5)
nRBC: 0 % (ref 0.0–0.2)

## 2023-03-12 LAB — COMPREHENSIVE METABOLIC PANEL
ALT: 18 U/L (ref 0–44)
AST: 24 U/L (ref 15–41)
Albumin: 2.6 g/dL — ABNORMAL LOW (ref 3.5–5.0)
Alkaline Phosphatase: 54 U/L (ref 38–126)
Anion gap: 9 (ref 5–15)
BUN: 17 mg/dL (ref 8–23)
CO2: 19 mmol/L — ABNORMAL LOW (ref 22–32)
Calcium: 8.7 mg/dL — ABNORMAL LOW (ref 8.9–10.3)
Chloride: 104 mmol/L (ref 98–111)
Creatinine, Ser: 1.52 mg/dL — ABNORMAL HIGH (ref 0.61–1.24)
GFR, Estimated: 46 mL/min — ABNORMAL LOW (ref >60)
Glucose, Bld: 158 mg/dL — ABNORMAL HIGH (ref 70–99)
Potassium: 4 mmol/L (ref 3.5–5.1)
Sodium: 132 mmol/L — ABNORMAL LOW (ref 135–145)
Total Bilirubin: 0.7 mg/dL (ref <1.2)
Total Protein: 7.2 g/dL (ref 6.5–8.1)

## 2023-03-12 LAB — STREP PNEUMONIAE URINARY ANTIGEN: Strep Pneumo Urinary Antigen: NEGATIVE

## 2023-03-12 LAB — IRON AND TIBC
Iron: 20 ug/dL — ABNORMAL LOW (ref 45–182)
Saturation Ratios: 8 % — ABNORMAL LOW (ref 17.9–39.5)
TIBC: 239 ug/dL — ABNORMAL LOW (ref 250–450)
UIBC: 219 ug/dL

## 2023-03-12 LAB — TROPONIN I (HIGH SENSITIVITY)
Troponin I (High Sensitivity): 20 ng/L — ABNORMAL HIGH (ref <18)
Troponin I (High Sensitivity): 17 ng/L (ref <18)

## 2023-03-12 LAB — FERRITIN: Ferritin: 163 ng/mL (ref 24–336)

## 2023-03-12 LAB — PROCALCITONIN: Procalcitonin: 0.1 ng/mL

## 2023-03-12 LAB — VITAMIN B12: Vitamin B-12: 238 pg/mL (ref 180–914)

## 2023-03-12 LAB — I-STAT ARTERIAL BLOOD GAS, ED
Acid-base deficit: 4 mmol/L — ABNORMAL HIGH (ref 0.0–2.0)
Bicarbonate: 19.9 mmol/L — ABNORMAL LOW (ref 20.0–28.0)
Calcium, Ion: 1.25 mmol/L (ref 1.15–1.40)
HCT: 42 % (ref 39.0–52.0)
Hemoglobin: 14.3 g/dL (ref 13.0–17.0)
O2 Saturation: 96 %
Patient temperature: 98.6
Potassium: 3.9 mmol/L (ref 3.5–5.1)
Sodium: 137 mmol/L (ref 135–145)
TCO2: 21 mmol/L — ABNORMAL LOW (ref 22–32)
pCO2 arterial: 30.9 mm[Hg] — ABNORMAL LOW (ref 32–48)
pH, Arterial: 7.417 (ref 7.35–7.45)
pO2, Arterial: 78 mm[Hg] — ABNORMAL LOW (ref 83–108)

## 2023-03-12 LAB — FOLATE: Folate: 20.8 ng/mL (ref >5.9)

## 2023-03-12 LAB — LACTIC ACID, PLASMA
Lactic Acid, Venous: 0.8 mmol/L (ref 0.5–1.9)
Lactic Acid, Venous: 0.8 mmol/L (ref 0.5–1.9)

## 2023-03-12 LAB — HEMOGLOBIN A1C
Hgb A1c MFr Bld: 7.2 % — ABNORMAL HIGH (ref 4.8–5.6)
Mean Plasma Glucose: 159.94 mg/dL

## 2023-03-12 LAB — RETICULOCYTES
Immature Retic Fract: 13.1 % (ref 2.3–15.9)
RBC.: 5.06 MIL/uL (ref 4.22–5.81)
Retic Count, Absolute: 56.7 10*3/uL (ref 19.0–186.0)
Retic Ct Pct: 1.1 % (ref 0.4–3.1)

## 2023-03-12 LAB — MRSA NEXT GEN BY PCR, NASAL: MRSA by PCR Next Gen: NOT DETECTED

## 2023-03-12 LAB — TSH: TSH: 1.722 u[IU]/mL (ref 0.350–4.500)

## 2023-03-12 MED ORDER — VITAMIN D 25 MCG (1000 UNIT) PO TABS
4000.0000 [IU] | ORAL_TABLET | Freq: Every day | ORAL | Status: DC
  Administered 2023-03-12 – 2023-03-14 (×3): 4000 [IU] via ORAL
  Filled 2023-03-12 (×3): qty 4

## 2023-03-12 MED ORDER — DORZOLAMIDE HCL-TIMOLOL MAL PF 2-0.5 % OP SOLN: 2.0000 [drp] | Freq: Every day | OPHTHALMIC | Status: DC

## 2023-03-12 MED ORDER — IPRATROPIUM-ALBUTEROL 0.5-2.5 (3) MG/3ML IN SOLN
3.0000 mL | Freq: Two times a day (BID) | RESPIRATORY_TRACT | Status: DC
  Administered 2023-03-13 – 2023-03-14 (×3): 3 mL via RESPIRATORY_TRACT
  Filled 2023-03-12 (×3): qty 3

## 2023-03-12 MED ORDER — EPINEPHRINE 0.15 MG/0.3ML IJ SOAJ: 0.1500 mg | INTRAMUSCULAR | Status: DC | PRN

## 2023-03-12 MED ORDER — SODIUM CHLORIDE 0.9 % IV SOLN: INTRAVENOUS | Status: DC

## 2023-03-12 MED ORDER — AMLODIPINE BESYLATE 5 MG PO TABS
5.0000 mg | ORAL_TABLET | Freq: Every day | ORAL | Status: DC
  Administered 2023-03-12 – 2023-03-13 (×2): 5 mg via ORAL
  Filled 2023-03-12 (×2): qty 1

## 2023-03-12 MED ORDER — ONDANSETRON HCL 4 MG/2ML IJ SOLN: 4.0000 mg | Freq: Four times a day (QID) | INTRAMUSCULAR | Status: DC | PRN

## 2023-03-12 MED ORDER — EPINEPHRINE 0.3 MG/0.3ML IJ SOAJ: 0.3000 mg | INTRAMUSCULAR | Status: DC | PRN

## 2023-03-12 MED ORDER — PSEUDOEPHEDRINE HCL ER 120 MG PO TB12: 120.0000 mg | ORAL_TABLET | Freq: Two times a day (BID) | ORAL | Status: DC

## 2023-03-12 MED ORDER — UMECLIDINIUM BROMIDE 62.5 MCG/ACT IN AEPB
1.0000 | INHALATION_SPRAY | Freq: Every day | RESPIRATORY_TRACT | Status: DC
  Administered 2023-03-12 – 2023-03-14 (×3): 1 via RESPIRATORY_TRACT
  Filled 2023-03-12: qty 7

## 2023-03-12 MED ORDER — OMEGA-3 FATTY ACIDS 1000 MG PO CAPS: 2.0000 g | ORAL_CAPSULE | Freq: Every day | ORAL | Status: DC

## 2023-03-12 MED ORDER — DORZOLAMIDE HCL 2 % OP SOLN
1.0000 [drp] | Freq: Two times a day (BID) | OPHTHALMIC | Status: DC
  Administered 2023-03-12 – 2023-03-14 (×5): 1 [drp] via OPHTHALMIC
  Filled 2023-03-12: qty 10

## 2023-03-12 MED ORDER — IRBESARTAN 300 MG PO TABS
300.0000 mg | ORAL_TABLET | Freq: Every day | ORAL | Status: DC
  Administered 2023-03-12 – 2023-03-14 (×3): 300 mg via ORAL
  Filled 2023-03-12 (×4): qty 1

## 2023-03-12 MED ORDER — PANTOPRAZOLE SODIUM 40 MG PO TBEC
40.0000 mg | DELAYED_RELEASE_TABLET | Freq: Every day | ORAL | Status: DC
  Administered 2023-03-12 – 2023-03-14 (×3): 40 mg via ORAL
  Filled 2023-03-12 (×3): qty 1

## 2023-03-12 MED ORDER — ONDANSETRON HCL 4 MG PO TABS: 4.0000 mg | ORAL_TABLET | Freq: Four times a day (QID) | ORAL | Status: DC | PRN

## 2023-03-12 MED ORDER — GUAIFENESIN ER 600 MG PO TB12
600.0000 mg | ORAL_TABLET | Freq: Two times a day (BID) | ORAL | Status: DC
  Administered 2023-03-12 – 2023-03-14 (×5): 600 mg via ORAL
  Filled 2023-03-12 (×5): qty 1

## 2023-03-12 MED ORDER — METHYLPREDNISOLONE SODIUM SUCC 40 MG IJ SOLR
40.0000 mg | Freq: Two times a day (BID) | INTRAMUSCULAR | Status: DC
  Administered 2023-03-12 – 2023-03-14 (×5): 40 mg via INTRAVENOUS
  Filled 2023-03-12 (×5): qty 1

## 2023-03-12 MED ORDER — LEVOTHYROXINE SODIUM 25 MCG PO TABS
125.0000 ug | ORAL_TABLET | Freq: Every morning | ORAL | Status: DC
  Administered 2023-03-12 – 2023-03-14 (×3): 125 ug via ORAL
  Filled 2023-03-12 (×3): qty 1

## 2023-03-12 MED ORDER — ACETAMINOPHEN 325 MG PO TABS: 650.0000 mg | ORAL_TABLET | Freq: Four times a day (QID) | ORAL | Status: DC | PRN

## 2023-03-12 MED ORDER — FLUTICASONE FUROATE-VILANTEROL 100-25 MCG/ACT IN AEPB
1.0000 | INHALATION_SPRAY | Freq: Every day | RESPIRATORY_TRACT | Status: DC
  Administered 2023-03-12 – 2023-03-14 (×3): 1 via RESPIRATORY_TRACT
  Filled 2023-03-12: qty 28

## 2023-03-12 MED ORDER — TIMOLOL MALEATE 0.5 % OP SOLN
1.0000 [drp] | Freq: Two times a day (BID) | OPHTHALMIC | Status: DC
Start: 2023-03-12 — End: 2023-03-14
  Administered 2023-03-12 – 2023-03-14 (×5): 1 [drp] via OPHTHALMIC
  Filled 2023-03-12: qty 5

## 2023-03-12 MED ORDER — LORATADINE 10 MG PO TABS: 10.0000 mg | ORAL_TABLET | Freq: Every day | ORAL | Status: DC

## 2023-03-12 MED ORDER — IPRATROPIUM-ALBUTEROL 0.5-2.5 (3) MG/3ML IN SOLN
3.0000 mL | Freq: Four times a day (QID) | RESPIRATORY_TRACT | Status: DC
  Administered 2023-03-12 (×4): 3 mL via RESPIRATORY_TRACT
  Filled 2023-03-12 (×4): qty 3

## 2023-03-12 MED ORDER — ALBUTEROL SULFATE (2.5 MG/3ML) 0.083% IN NEBU: 2.5000 mg | INHALATION_SOLUTION | RESPIRATORY_TRACT | Status: DC | PRN

## 2023-03-12 MED ORDER — SIMVASTATIN 20 MG PO TABS
40.0000 mg | ORAL_TABLET | Freq: Every day | ORAL | Status: DC
  Administered 2023-03-12 – 2023-03-13 (×2): 40 mg via ORAL
  Filled 2023-03-12 (×2): qty 2

## 2023-03-12 MED ORDER — ACETAMINOPHEN 650 MG RE SUPP: 650.0000 mg | Freq: Four times a day (QID) | RECTAL | Status: DC | PRN

## 2023-03-12 NOTE — ED Provider Notes (Signed)
 Patient is a handoff from Small, PA-C.  Briefly, patient is here with concerns of shortness of breath over the last several days.  Findings consistent with multifocal pneumonia.  Antibiotic therapy initiated.  Patient has been hypoxic and is requiring new O2 demand on nasal cannula.  Consult placed for admission to hospitalist. Physical Exam  BP 119/64   Pulse 66   Temp 99 F (37.2 C) (Oral)   Resp (!) 22   Ht 5\' 10"  (1.778 m)   Wt 75.8 kg   SpO2 96%   BMI 23.96 kg/m   Physical Exam Vitals and nursing note reviewed.  Constitutional:      General: He is not in acute distress.    Appearance: He is well-developed.  HENT:     Head: Normocephalic and atraumatic.  Eyes:     Conjunctiva/sclera: Conjunctivae normal.  Cardiovascular:     Rate and Rhythm: Normal rate and regular rhythm.     Heart sounds: No murmur heard. Pulmonary:     Effort: Pulmonary effort is normal. No respiratory distress.     Breath sounds: Normal breath sounds.     Comments: +crackles in RLL Abdominal:     Palpations: Abdomen is soft.     Tenderness: There is no abdominal tenderness.  Musculoskeletal:        General: No swelling.     Cervical back: Neck supple.  Skin:    General: Skin is warm and dry.     Capillary Refill: Capillary refill takes less than 2 seconds.  Neurological:     Mental Status: He is alert.  Psychiatric:        Mood and Affect: Mood normal.     Procedures  Procedures  ED Course / MDM    Medical Decision Making Amount and/or Complexity of Data Reviewed Labs: ordered. Radiology: ordered.  Risk Decision regarding hospitalization.   Patient is a handoff from Small, PA-C. Please see her note for full HPI and physical exam findings. Briefly, patient is here with concerns of shortness of breath over the last several days.  Findings consistent with multifocal pneumonia.  Antibiotic therapy initiated.  Patient has been hypoxic and is requiring new O2 demand on nasal cannula and  being titrated off non-rebreather.  Consult placed for admission to hospitalist. Patient otherwise remaining stable at this time with no evidence of acute respiratory distress.  Spoke with Dr. Maisie Fus, hospitalist, who will be admitting patient.       Smitty Knudsen, PA-C 03/13/23 1333    Mesner, Barbara Cower, MD 03/13/23 2302

## 2023-03-12 NOTE — ED Notes (Signed)
Pt eating

## 2023-03-12 NOTE — Progress Notes (Signed)
   03/12/23 2236  BiPAP/CPAP/SIPAP  $ Non-Invasive Home Ventilator  Initial  $ Face Mask Medium Yes  BiPAP/CPAP/SIPAP Pt Type Adult  BiPAP/CPAP/SIPAP Resmed  Mask Type Nasal mask  Mask Size Large  Respiratory Rate 16 breaths/min  EPAP 5 cmH2O  Flow Rate 2 lpm  Patient Home Equipment No  Auto Titrate No  BiPAP/CPAP /SiPAP Vitals  Resp 18

## 2023-03-12 NOTE — ED Notes (Addendum)
Pt was eating his provided lunch and noticed there was beef in it. He states the he has alpha gal and that he has reactions to beef and pork. He has been known to stop breathing a few hours after ingesting. Pt did not eat the meat that he saw. He states he needs an epi-pen ordered in case. MD advised, meal order updated. Pt is not showing any s/s at this time

## 2023-03-12 NOTE — ED Notes (Signed)
ED TO INPATIENT HANDOFF REPORT  ED Nurse Name and Phone #: Melburn Popper Name/Age/Gender Douglas [name] 82 y.o. male Room/Bed: 010C/010C  Code Status   Code Status: Full Code  Home/SNF/Other Home Patient oriented to: self, place, time, and situation Is this baseline? Yes   Triage Complete: Triage complete  Chief Complaint CAP (community acquired pneumonia) [J18.9] Multifocal pneumonia [J18.9]  Triage Note Pt BIB EMS from urgent care. C/o SHOB, home sat were low- High 70s RA, 97% non re breather    [facility] admin 125 solumedrol IM, duoneb,  EMS VS 130/78, HR 82, 97% NRB   Allergies Allergies  Allergen Reactions   Alpha-Gal Anaphylaxis    Anaphylaxis (ALLERGY) (Anaphylaxis), Cough (ALLERGY/intolerance), Respiratory Distress (ALLERGY/intolerance), Shortness Of Breath (ALLERGY/intolerance) (Shortness Of Breath), Wheezing (ALLERGY/intolerance)   Other Other (See Comments)    Level of Care/Admitting Diagnosis ED Disposition     ED Disposition  Admit   Condition  --   Comment  Hospital Area: [facility] [[id number]]  Level of Care: Telemetry Cardiac [[id number]]  May admit patient to [facility] or [name] if equivalent level of care is available:: No  Covid Evaluation: Confirmed COVID Negative  Diagnosis: Multifocal pneumonia [[id number]]  Admitting Physician: Lurline Del [[id number]]  Attending Physician: Lurline Del [[id number]]  Certification:: I certify this patient will need inpatient services for at least 2 midnights          B Medical/Surgery History Past Medical History:  Diagnosis Date   Allergic rhinitis    Allergy    seasonal   Anal fissure    Anemia    Bilateral inguinal hernia    Chest pain    Chronic kidney disease    h/o- renal calculi- passed spont. , 10 episodes over the course of 20 yrs.    Colon polyps    GERD (gastroesophageal reflux disease)    Hyperlipidemia    Hypertension    Hypogonadism in male     Hypothyroidism    Kidney stones    Osteoporosis    Scrotal mass    Sebaceous cyst    Sleep apnea    CPAP q night - study done 8-10 yrs. ago    Testosterone deficiency    injections q 3 week- July 8- last injection    Thyroid disease    Vitamin D deficiency    Past Surgical History:  Procedure Laterality Date   ANAL FISSURE REPAIR     CHOLECYSTECTOMY     COLONOSCOPY     FIBULA FRACTURE SURGERY     fibula and tibia fracture    FRACTURE SURGERY     L leg -plate & screws - ORIF- fibula   HEMORRHOID SURGERY     HERNIA REPAIR  04/19/2011   bilateral   LAPAROSCOPIC INGUINAL HERNIA REPAIR  04/19/2011   bilateral   LAPAROSCOPY  10/11/2011   Procedure: LAPAROSCOPY DIAGNOSTIC;  Surgeon: Ardeth Sportsman, MD;  Location: [facility] OR;  Service: General;  Laterality: N/A;  UMBILICAL EXPLORATION   MASS EXCISION  10/11/2011   Procedure: EXCISION MASS;  Surgeon: Ardeth Sportsman, MD;  Location: [facility] OR;  Service: General;  Laterality: N/A;  EXCISION OF UMBILICAL MASS   POLYPECTOMY     UPPER GASTROINTESTINAL ENDOSCOPY       A IV Location/Drains/Wounds Patient Lines/Drains/Airways Status     Active Line/Drains/Airways     Name Placement date Placement time Site Days   Peripheral IV 03/11/23 20 G Anterior;Left Forearm 03/11/23  2249  Forearm  1   Incision 10/11/11 Abdomen Other (Comment) 10/11/11  1225  -- 4170   Incision - 2 Ports Abdomen 1: Left;Upper;Lateral 2: Left;Lower;Lateral 10/11/11  --  -- 4170            Intake/Output Last 24 hours  Intake/Output Summary (Last 24 hours) at 03/12/2023 1329 Last data filed at 03/12/2023 0036 Gross per 24 hour  Intake 200 ml  Output 200 ml  Net 0 ml    Labs/Imaging Results for orders placed or performed during the hospital encounter of 03/11/23 (from the past 48 hours)  CBC with Differential     Status: Abnormal   Collection Time: 03/11/23 11:00 PM  Result Value Ref Range   WBC 14.8 (H) 4.0 - 10.5 K/uL   RBC 4.99 4.22 - 5.81 MIL/uL    Hemoglobin 14.2 13.0 - 17.0 g/dL   HCT 40.3 47.4 - 25.9 %   MCV 87.2 80.0 - 100.0 fL   MCH 28.5 26.0 - 34.0 pg   MCHC 32.6 30.0 - 36.0 g/dL   RDW 56.3 87.5 - 64.3 %   Platelets 349 150 - 400 K/uL   nRBC 0.0 0.0 - 0.2 %   Neutrophils Relative % 86 %   Neutro Abs 13.0 (H) 1.7 - 7.7 K/uL   Lymphocytes Relative 5 %   Lymphs Abs 0.7 0.7 - 4.0 K/uL   Monocytes Relative 5 %   Monocytes Absolute 0.7 0.1 - 1.0 K/uL   Eosinophils Relative 2 %   Eosinophils Absolute 0.2 0.0 - 0.5 K/uL   Basophils Relative 1 %   Basophils Absolute 0.1 0.0 - 0.1 K/uL   Immature Granulocytes 1 %   Abs Immature Granulocytes 0.09 (H) 0.00 - 0.07 K/uL    Comment: Performed at [facility], [address] [place], [place] [address]  Comprehensive metabolic panel     Status: Abnormal   Collection Time: 03/11/23 11:00 PM  Result Value Ref Range   Sodium 131 (L) 135 - 145 mmol/L   Potassium 3.8 3.5 - 5.1 mmol/L   Chloride 105 98 - 111 mmol/L   CO2 17 (L) 22 - 32 mmol/L   Glucose, Bld 162 (H) 70 - 99 mg/dL    Comment: Glucose reference range applies only to samples taken after fasting for at least 8 hours.   BUN 18 8 - 23 mg/dL   Creatinine, Ser 8.84 (H) 0.61 - 1.24 mg/dL   Calcium 8.7 (L) 8.9 - 10.3 mg/dL   Total Protein 7.0 6.5 - 8.1 g/dL   Albumin 2.7 (L) 3.5 - 5.0 g/dL   AST 25 15 - 41 U/L   ALT 19 0 - 44 U/L   Alkaline Phosphatase 53 38 - 126 U/L   Total Bilirubin 0.9 <1.2 mg/dL   GFR, Estimated 44 (L) >60 mL/min    Comment: (NOTE) Calculated using the CKD-EPI Creatinine Equation (2021)    Anion gap 9 5 - 15    Comment: Performed at [facility], [address] [place], [place] [address]  Troponin I (High Sensitivity)     Status: None   Collection Time: 03/11/23 11:00 PM  Result Value Ref Range   Troponin I (High Sensitivity) 17 <18 ng/L    Comment: (NOTE) Elevated high sensitivity troponin I (hsTnI) values and significant  changes across serial measurements may suggest ACS but many  other  chronic and acute conditions are known to elevate hsTnI results.  Refer to the "Links" section for chest  pain algorithms and additional  guidance. Performed at [facility], [address], [place], [place] [address]   Resp panel by RT-PCR (RSV, Flu A&B, Covid) Anterior Nasal Swab     Status: None   Collection Time: 03/11/23 11:00 PM   Specimen: Anterior Nasal Swab  Result Value Ref Range   SARS Coronavirus 2 by RT PCR NEGATIVE NEGATIVE   Influenza A by PCR NEGATIVE NEGATIVE   Influenza B by PCR NEGATIVE NEGATIVE    Comment: (NOTE) The Xpert Xpress SARS-CoV-2/FLU/RSV plus assay is intended as an aid in the diagnosis of influenza from Nasopharyngeal swab specimens and should not be used as a sole basis for treatment. Nasal washings and aspirates are unacceptable for Xpert Xpress SARS-CoV-2/FLU/RSV testing.  Fact Sheet for Patients: BloggerCourse.com  Fact Sheet for Healthcare Providers: SeriousBroker.it  This test is not yet approved or cleared by the [place] FDA and has been authorized for detection and/or diagnosis of SARS-CoV-2 by FDA under an Emergency Use Authorization (EUA). This EUA will remain in effect (meaning this test can be used) for the duration of the COVID-19 declaration under Section 564(b)(1) of the Act, 21 U.S.C. section 360bbb-3(b)(1), unless the authorization is terminated or revoked.     Resp Syncytial Virus by PCR NEGATIVE NEGATIVE    Comment: (NOTE) Fact Sheet for Patients: BloggerCourse.com  Fact Sheet for Healthcare Providers: SeriousBroker.it  This test is not yet approved or cleared by the [place] FDA and has been authorized for detection and/or diagnosis of SARS-CoV-2 by FDA under an Emergency Use Authorization (EUA). This EUA will remain in effect (meaning this test can be used) for the duration of the COVID-19  declaration under Section 564(b)(1) of the Act, 21 U.S.C. section 360bbb-3(b)(1), unless the authorization is terminated or revoked.  Performed at [facility], [address] [place], [place] [address]   Lactic acid, plasma     Status: None   Collection Time: 03/11/23 11:48 PM  Result Value Ref Range   Lactic Acid, Venous 0.8 0.5 - 1.9 mmol/L    Comment: Performed at [facility], [address] [place], [place] [address]  Respiratory (~20 pathogens) panel by PCR     Status: None   Collection Time: 03/12/23 12:33 AM   Specimen: Nasopharyngeal Swab; Respiratory  Result Value Ref Range   Adenovirus NOT DETECTED NOT DETECTED   Coronavirus 229E NOT DETECTED NOT DETECTED    Comment: (NOTE) The Coronavirus on the Respiratory Panel, DOES NOT test for the novel  Coronavirus (2019 nCoV)    Coronavirus HKU1 NOT DETECTED NOT DETECTED   Coronavirus NL63 NOT DETECTED NOT DETECTED   Coronavirus OC43 NOT DETECTED NOT DETECTED   Metapneumovirus NOT DETECTED NOT DETECTED   Rhinovirus / Enterovirus NOT DETECTED NOT DETECTED   Influenza A NOT DETECTED NOT DETECTED   Influenza B NOT DETECTED NOT DETECTED   Parainfluenza Virus 1 NOT DETECTED NOT DETECTED   Parainfluenza Virus 2 NOT DETECTED NOT DETECTED   Parainfluenza Virus 3 NOT DETECTED NOT DETECTED   Parainfluenza Virus 4 NOT DETECTED NOT DETECTED   Respiratory Syncytial Virus NOT DETECTED NOT DETECTED   Bordetella pertussis NOT DETECTED NOT DETECTED   Bordetella Parapertussis NOT DETECTED NOT DETECTED   Chlamydophila pneumoniae NOT DETECTED NOT DETECTED   Mycoplasma pneumoniae NOT DETECTED NOT DETECTED    Comment: Performed at [facility], [address], [place], [place] [address]  Lactic acid, plasma     Status: None  Collection Time: 03/12/23  1:22 AM  Result Value Ref Range   Lactic Acid, Venous 0.8 0.5 - 1.9 mmol/L    Comment: Performed at [facility], [address], [place], [place] [address]  Troponin  I (High Sensitivity)     Status: Abnormal   Collection Time: 03/12/23  1:22 AM  Result Value Ref Range   Troponin I (High Sensitivity) 20 (H) <18 ng/L    Comment: (NOTE) Elevated high sensitivity troponin I (hsTnI) values and significant  changes across serial measurements may suggest ACS but many other  chronic and acute conditions are known to elevate hsTnI results.  Refer to the "Links" section for chest pain algorithms and additional  guidance. Performed at [facility], [address], [place], [place] [address]   TSH     Status: None   Collection Time: 03/12/23  1:22 AM  Result Value Ref Range   TSH 1.722 0.350 - 4.500 uIU/mL    Comment: Performed by a 3rd Generation assay with a functional sensitivity of <=0.01 uIU/mL. Performed at [facility], [address] [place], [place] [address]   Hemoglobin A1c     Status: Abnormal   Collection Time: 03/12/23  1:22 AM  Result Value Ref Range   Hgb A1c MFr Bld 7.2 (H) 4.8 - 5.6 %    Comment: (NOTE) Pre diabetes:          5.7%-6.4%  Diabetes:              >6.4%  Glycemic control for   <7.0% adults with diabetes    Mean Plasma Glucose 159.94 mg/dL    Comment: Performed at [facility], [address], [place], [place] [address]  CBC     Status: Abnormal   Collection Time: 03/12/23  1:22 AM  Result Value Ref Range   WBC 14.1 (H) 4.0 - 10.5 K/uL   RBC 5.02 4.22 - 5.81 MIL/uL   Hemoglobin 14.5 13.0 - 17.0 g/dL   HCT 29.5 62.1 - 30.8 %   MCV 86.7 80.0 - 100.0 fL   MCH 28.9 26.0 - 34.0 pg   MCHC 33.3 30.0 - 36.0 g/dL   RDW 65.7 84.6 - 96.2 %   Platelets 356 150 - 400 K/uL   nRBC 0.0 0.0 - 0.2 %    Comment: Performed at [facility], [address] [place], [place] [address]  Comprehensive metabolic panel     Status: Abnormal   Collection Time: 03/12/23  1:22 AM  Result Value Ref Range   Sodium 132 (L) 135 - 145 mmol/L   Potassium 4.0 3.5 - 5.1 mmol/L   Chloride 104 98 - 111 mmol/L   CO2 19 (L) 22  - 32 mmol/L   Glucose, Bld 158 (H) 70 - 99 mg/dL    Comment: Glucose reference range applies only to samples taken after fasting for at least 8 hours.   BUN 17 8 - 23 mg/dL   Creatinine, Ser 1.32 (H) 0.61 - 1.24 mg/dL   Calcium 8.7 (L) 8.9 - 10.3 mg/dL   Total Protein 7.2 6.5 - 8.1 g/dL   Albumin 2.6 (L) 3.5 - 5.0 g/dL   AST 24 15 - 41 U/L   ALT 18 0 - 44 U/L   Alkaline Phosphatase 54 38 - 126 U/L   Total Bilirubin 0.7 <1.2 mg/dL   GFR, Estimated 46 (L) >60 mL/min    Comment: (NOTE) Calculated using the CKD-EPI Creatinine  Equation (2021)    Anion gap 9 5 - 15    Comment: Performed at [facility], [address], [place], [place] [address]  Vitamin B12     Status: None   Collection Time: 03/12/23  1:28 AM  Result Value Ref Range   Vitamin B-12 238 180 - 914 pg/mL    Comment: (NOTE) This assay is not validated for testing neonatal or myeloproliferative syndrome specimens for Vitamin B12 levels. Performed at [facility], [address] [place], [place] [address]   Folate     Status: None   Collection Time: 03/12/23  1:28 AM  Result Value Ref Range   Folate 20.8 >5.9 ng/mL    Comment: Performed at [facility], [address], [place], [place] [address]  Iron and TIBC     Status: Abnormal   Collection Time: 03/12/23  1:28 AM  Result Value Ref Range   Iron 20 (L) 45 - 182 ug/dL   TIBC 638 (L) 756 - 433 ug/dL   Saturation Ratios 8 (L) 17.9 - 39.5 %   UIBC 219 ug/dL    Comment: Performed at [facility], [address], [place], [place] [address]  Ferritin     Status: None   Collection Time: 03/12/23  1:28 AM  Result Value Ref Range   Ferritin 163 24 - 336 ng/mL    Comment: Performed at [facility], [address], [place], [place] [address]  Reticulocytes     Status: None   Collection Time: 03/12/23  1:28 AM  Result Value Ref Range   Retic Ct Pct 1.1 0.4 - 3.1 %   RBC. 5.06 4.22 - 5.81 MIL/uL   Retic Count, Absolute 56.7 19.0 - 186.0 K/uL    Immature Retic Fract 13.1 2.3 - 15.9 %    Comment: Performed at [facility], [address], [place], [place] [address]  I-Stat arterial blood gas, ED     Status: Abnormal   Collection Time: 03/12/23  2:48 AM  Result Value Ref Range   pH, Arterial 7.417 7.35 - 7.45   pCO2 arterial 30.9 (L) 32 - 48 mmHg   pO2, Arterial 78 (L) 83 - 108 mmHg   Bicarbonate 19.9 (L) 20.0 - 28.0 mmol/L   TCO2 21 (L) 22 - 32 mmol/L   O2 Saturation 96 %   Acid-base deficit 4.0 (H) 0.0 - 2.0 mmol/L   Sodium 137 135 - 145 mmol/L   Potassium 3.9 3.5 - 5.1 mmol/L   Calcium, Ion 1.25 1.15 - 1.40 mmol/L   HCT 42.0 39.0 - 52.0 %   Hemoglobin 14.3 13.0 - 17.0 g/dL   Patient temperature 60.1 F    Sample type ARTERIAL   Troponin I (High Sensitivity)     Status: None   Collection Time: 03/12/23  3:23 AM  Result Value Ref Range   Troponin I (High Sensitivity) 17 <18 ng/L    Comment: (NOTE) Elevated high sensitivity troponin I (hsTnI) values and significant  changes across serial measurements may suggest ACS but many other  chronic and acute conditions are known to elevate hsTnI results.  Refer to the "Links" section for chest pain algorithms and additional  guidance. Performed at [facility], [address], [place], [place] [address]   Strep pneumoniae urinary antigen     Status: None   Collection Time: 03/12/23  3:30 AM  Result Value Ref Range   Strep Pneumo  Urinary Antigen NEGATIVE NEGATIVE    Comment:        Infection due to S. pneumoniae cannot be absolutely ruled out since the antigen present may be below the detection limit of the test. Performed at [facility], [address] [place], [place] [address]   Procalcitonin     Status: None   Collection Time: 03/12/23 10:21 AM  Result Value Ref Range   Procalcitonin 0.10 ng/mL    Comment:        Interpretation: PCT (Procalcitonin) <= 0.5 ng/mL: Systemic infection (sepsis) is not likely. Local bacterial infection is  possible. (NOTE)       Sepsis PCT Algorithm           Lower Respiratory Tract                                      Infection PCT Algorithm    ----------------------------     ----------------------------         PCT < 0.25 ng/mL                PCT < 0.10 ng/mL          Strongly encourage             Strongly discourage   discontinuation of antibiotics    initiation of antibiotics    ----------------------------     -----------------------------       PCT 0.25 - 0.50 ng/mL            PCT 0.10 - 0.25 ng/mL               OR       >80% decrease in PCT            Discourage initiation of                                            antibiotics      Encourage discontinuation           of antibiotics    ----------------------------     -----------------------------         PCT >= 0.50 ng/mL              PCT 0.26 - 0.50 ng/mL               AND        <80% decrease in PCT             Encourage initiation of                                             antibiotics       Encourage continuation           of antibiotics    ----------------------------     -----------------------------        PCT >= 0.50 ng/mL                  PCT > 0.50 ng/mL               AND         increase in PCT  Strongly encourage                                      initiation of antibiotics    Strongly encourage escalation           of antibiotics                                     -----------------------------                                           PCT <= 0.25 ng/mL                                                 OR                                        > 80% decrease in PCT                                      Discontinue / Do not initiate                                             antibiotics  Performed at [facility] Lab, [address] [place], [place] [address]    DG CHEST PORT 1 VIEW Result Date: 03/11/2023 CLINICAL DATA:  Shortness of breath and hypoxia EXAM: PORTABLE CHEST 1 VIEW  COMPARISON:  06/15/2010 radiograph and 01/18/2022 CTA chest FINDINGS: Bilateral hazy airspace and interstitial opacities with confluence airspace opacity in the left mid lung laterally. Cardiomegaly. No pleural effusion or pneumothorax. No displaced rib fractures. IMPRESSION: Findings favor multifocal pneumonia. Electronically Signed   By: Minerva Fester M.D.   On: 03/11/2023 23:06    Pending Labs Unresulted Labs (From admission, onward)     Start     Ordered   03/12/23 0330  Legionella Pneumophila Serogp 1 Ur Ag  Once,   R        03/12/23 0330   03/12/23 0033  Culture, blood (routine x 2) Call MD if unable to obtain prior to antibiotics being given  (COPD / Pneumonia / Cellulitis / Lower Extremity Wound)  BLOOD CULTURE X 2,   R (with TIMED occurrences)     Comments: If blood cultures drawn in Emergency Department - Do not draw and cancel order    03/12/23 0038   03/12/23 0033  Expectorated Sputum Assessment w Gram Stain, Rflx to Resp Cult  (COPD / Pneumonia / Cellulitis / Lower Extremity Wound)  Once,   R        03/12/23 0038   03/12/23 0033  MRSA Next Gen by PCR, Nasal  (COPD / Pneumonia / Cellulitis / Lower Extremity Wound)  Once,   R        03/12/23  0038   03/12/23 0033  Blood gas, arterial  (COPD / Pneumonia / Cellulitis / Lower Extremity Wound)  ONCE - STAT,   STAT        03/12/23 0038            Vitals/Pain Today's Vitals   03/12/23 0500 03/12/23 0800 03/12/23 1119 03/12/23 1121  BP: 135/64 132/69 118/65   Pulse: (!) 54 60 (!) 59   Resp: 15 16 (!) 27   Temp:  98 F (36.7 C) 98.2 F (36.8 C)   TempSrc:  Oral Oral   SpO2: 96% 94% 93%   Weight:      Height:      PainSc:   0-No pain 0-No pain    Isolation Precautions Droplet precaution  Medications Medications  acetaminophen (TYLENOL) tablet 650 mg (has no administration in time range)    Or  acetaminophen (TYLENOL) suppository 650 mg (has no administration in time range)  ondansetron (ZOFRAN) tablet 4 mg (has no  administration in time range)    Or  ondansetron (ZOFRAN) injection 4 mg (has no administration in time range)  ipratropium-albuterol (DUONEB) 0.5-2.5 (3) MG/3ML nebulizer solution 3 mL (3 mLs Nebulization Given 03/12/23 0853)  albuterol (PROVENTIL) (2.5 MG/3ML) 0.083% nebulizer solution 2.5 mg (has no administration in time range)  methylPREDNISolone sodium succinate (SOLU-MEDROL) 40 mg/mL injection 40 mg (40 mg Intravenous Given 03/12/23 1230)  guaiFENesin (MUCINEX) 12 hr tablet 600 mg (600 mg Oral Given 03/12/23 1012)  amLODipine (NORVASC) tablet 5 mg (has no administration in time range)  simvastatin (ZOCOR) tablet 40 mg (has no administration in time range)  irbesartan (AVAPRO) tablet 300 mg (300 mg Oral Given 03/12/23 1012)  levothyroxine (SYNTHROID) tablet 125 mcg (125 mcg Oral Given 03/12/23 0800)  pantoprazole (PROTONIX) EC tablet 40 mg (40 mg Oral Given 03/12/23 1012)  cholecalciferol (VITAMIN D3) 25 MCG (1000 UNIT) tablet 4,000 Units (4,000 Units Oral Given 03/12/23 1013)  fluticasone furoate-vilanterol (BREO ELLIPTA) 100-25 MCG/ACT 1 puff (1 puff Inhalation Given 03/12/23 0852)    And  umeclidinium bromide (INCRUSE ELLIPTA) 62.5 MCG/ACT 1 puff (1 puff Inhalation Given 03/12/23 0851)  dorzolamide (TRUSOPT) 2 % ophthalmic solution 1 drop (1 drop Right Eye Not Given 03/12/23 1153)    And  timolol (TIMOPTIC) 0.5 % ophthalmic solution 1 drop (1 drop Right Eye Given 03/12/23 1014)  cefTRIAXone (ROCEPHIN) 1 g in sodium chloride 0.9 % 100 mL IVPB (0 g Intravenous Stopped 03/12/23 0022)  azithromycin (ZITHROMAX) 500 mg in sodium chloride 0.9 % 250 mL IVPB (0 mg Intravenous Stopped 03/12/23 0159)    Mobility walks     Focused Assessments     R Recommendations: See Admitting Provider Note  Report given to:   Additional Notes:

## 2023-03-12 NOTE — H&P (Signed)
History and Physical    Douglas [name] ZOX:[id number] DOB: 07-Oct-1940 DOA: 03/11/2023  PCP: Rodrigo Ran, MD  Patient coming from: UC   I have personally briefly reviewed patient's old medical records in [facility] Link  Chief Complaint:  sob  HPI: Douglas [name] is a 82 y.o. male with medical history significant of  Hypertension,HLD, hypothyroidism, pulmonary fibrosis, GERD,CKDIIIb ,OSA compliant with cpap,COPD who presents initially to UC with complaints of progressive SOB.  ON evaluation at [facility] patient was note to be hypoxic to 70's on room air and that juncture patient placed on NRB and transported to the ED.  Patient in the field was treated with 125mg  solumedrol as well as douneb with repeat vitals: bp 130/78, HR 82, sta 97% NRB.  Per patient he noted chills, but no fever, no n/v/d/dysuria or chest pain.  Per wife low energy and decrease appetite.  ED Course:  Vitals: afeb bp 137/75 hr 79 sat 100% on NRB Rr 23 ON arrival on NRB patient sat was 97% patient was able to be weaned to 3L [place].  EKG: NSR, PVC, LAFB new from prior  WJX:BJYNWGNFAO: Findings favor multifocal pneumonia.  wbc 14.9, with left shift Na 131( 140),k 3.8, cl 105,  bicarb 17, glu 162, cr 1.58 around base 1.42 Respiratory swab  neg Lactic 0.8  Tx ctx  Review of Systems: As per HPI otherwise 10 point review of systems negative.   Past Medical History:  Diagnosis Date   Allergic rhinitis    Allergy    seasonal   Anal fissure    Anemia    Bilateral inguinal hernia    Chest pain    Chronic kidney disease    h/o- renal calculi- passed spont. , 10 episodes over the course of 20 yrs.    Colon polyps    GERD (gastroesophageal reflux disease)    Hyperlipidemia    Hypertension    Hypogonadism in male    Hypothyroidism    Kidney stones    Osteoporosis    Scrotal mass    Sebaceous cyst    Sleep apnea    CPAP q night - study done 8-10 yrs. ago    Testosterone deficiency    injections q 3 week- July 8-  last injection    Thyroid disease    Vitamin D deficiency     Past Surgical History:  Procedure Laterality Date   ANAL FISSURE REPAIR     CHOLECYSTECTOMY     COLONOSCOPY     FIBULA FRACTURE SURGERY     fibula and tibia fracture    FRACTURE SURGERY     L leg -plate & screws - ORIF- fibula   HEMORRHOID SURGERY     HERNIA REPAIR  04/19/2011   bilateral   LAPAROSCOPIC INGUINAL HERNIA REPAIR  04/19/2011   bilateral   LAPAROSCOPY  10/11/2011   Procedure: LAPAROSCOPY DIAGNOSTIC;  Surgeon: Ardeth Sportsman, MD;  Location: [facility] OR;  Service: General;  Laterality: N/A;  UMBILICAL EXPLORATION   MASS EXCISION  10/11/2011   Procedure: EXCISION MASS;  Surgeon: Ardeth Sportsman, MD;  Location: [facility] OR;  Service: General;  Laterality: N/A;  EXCISION OF UMBILICAL MASS   POLYPECTOMY     UPPER GASTROINTESTINAL ENDOSCOPY       reports that he quit smoking about 24 years ago. His smoking use included cigarettes. He has never used smokeless tobacco. He reports that he does not drink alcohol and does not use drugs.  Allergies  Allergen Reactions  Alpha-Gal Anaphylaxis    Anaphylaxis (ALLERGY) (Anaphylaxis), Cough (ALLERGY/intolerance), Respiratory Distress (ALLERGY/intolerance), Shortness Of Breath (ALLERGY/intolerance) (Shortness Of Breath), Wheezing (ALLERGY/intolerance)   Other Other (See Comments)    Family History  Problem Relation Age of Onset   Stroke Mother    Stroke Father    Colon cancer Neg Hx    Rectal cancer Neg Hx    Stomach cancer Neg Hx    Esophageal cancer Neg Hx    Colon polyps Neg Hx    Allergic rhinitis Neg Hx    Asthma Neg Hx    Angioedema Neg Hx    Atopy Neg Hx    Eczema Neg Hx    Immunodeficiency Neg Hx    Urticaria Neg Hx     Prior to Admission medications   Medication Sig Start Date End Date Taking? Authorizing Provider  amLODipine (NORVASC) 5 MG tablet Take 1 tablet by mouth daily.    [provider]  cetirizine (ZYRTEC) 5 MG tablet Take 10 mg by mouth  daily.     [provider]  cholecalciferol (VITAMIN D) 1000 UNITS tablet Take 1,000 Units by mouth 2 (two) times daily.    [provider]  denosumab (PROLIA) 60 MG/ML SOSY injection     [provider]  Dorzolamide HCl-Timolol Mal PF 2-0.5 % SOLN Apply 2 drops to eye daily.    [provider]  EPINEPHrine 0.3 mg/0.3 mL IJ SOAJ injection Inject 0.3 mg into the muscle as needed for anaphylaxis.    [provider]  ferrous sulfate 325 (65 FE) MG tablet as directed Orally Once a day Patient not taking: Reported on 03/11/2022    [provider]  fish oil-omega-3 fatty acids 1000 MG capsule Take 1 g by mouth 4 (four) times daily.     [provider]  hydrochlorothiazide (HYDRODIURIL) 25 MG tablet  01/31/22   [provider]  levothyroxine (SYNTHROID) 125 MCG tablet Take 125 mcg by mouth every morning. 06/14/20   [provider]  Magnesium 500 MG CAPS qd Oral 06/08/09   [provider]  metoprolol tartrate (LOPRESSOR) 50 MG tablet Take one tablet by mouth 2 hours prior to CT Scan 02/28/22   Corky Crafts, MD  metroNIDAZOLE (METROCREAM) 0.75 % cream Apply a thin layer to affected areas twice daily after washing External 02/03/13   [provider]  Multiple Vitamin (MULTIVITAMIN PO) Take by mouth daily.      [provider]  OVER THE COUNTER MEDICATION Magnesium 500mg  po daily    [provider]  pantoprazole (PROTONIX) 40 MG tablet Take 40 mg by mouth daily.    [provider]  simvastatin (ZOCOR) 40 MG tablet Take 40 mg by mouth every evening.    [provider]  telmisartan (MICARDIS) 80 MG tablet Take 80 mg by mouth daily.    [provider]  testosterone cypionate (DEPOTESTOTERONE CYPIONATE) 100 MG/ML injection Inject into the muscle every 21 ( twenty-one) days. For IM use only    [provider]    Physical Exam: Vitals:   03/11/23 2315  03/11/23 2330 03/11/23 2345 03/11/23 2350  BP: 123/66 125/66 119/64   Pulse: 68 69 64 66  Resp: (!) 23 (!) 21 (!) 23 (!) 22  Temp:    99 F (37.2 C)  TempSrc:    Oral  SpO2: 100% 100% 100% 96%  Weight:      Height:        Constitutional: NAD, calm, comfortable  Vitals:   03/11/23 2315 03/11/23 2330 03/11/23 2345 03/11/23 2350  BP: 123/66 125/66 119/64   Pulse: 68 69 64 66  Resp: (!) 23 (!) 21 (!) 23 (!) 22  Temp:    99 F (37.2 C)  TempSrc:    Oral  SpO2: 100% 100% 100% 96%  Weight:      Height:       Eyes:  lids and conjunctivae normal ENMT: Mucous membranes are moist. Neck: normal, supple, no masses, no thyromegaly Respiratory:  bilaterally crackles. Normal respiratory effort. No accessory muscle use.  Cardiovascular: Regular rate and rhythm, no murmurs / rubs / gallops. No extremity edema. 2+ pedal pulse Abdomen: no tenderness, no masses palpated. No hepatosplenomegaly. Bowel sounds positive.  Musculoskeletal: no clubbing / cyanosis. No joint deformity upper and lower extremities. Good ROM, no contractures. Normal muscle tone.  Skin: no rashes, lesions, ulcers. No induration Neurologic: CN 2-12 grossly intact. Sensation intact,  MAE x4 Psychiatric: Normal judgment and insight. Alert and oriented x 3. Normal mood.    Labs on Admission: I have personally reviewed following labs and imaging studies  CBC: Recent Labs  Lab 03/11/23 2300  WBC 14.8*  NEUTROABS 13.0*  HGB 14.2  HCT 43.5  MCV 87.2  PLT 349   Basic Metabolic Panel: Recent Labs  Lab 03/11/23 2300  NA 131*  K 3.8  CL 105  CO2 17*  GLUCOSE 162*  BUN 18  CREATININE 1.58*  CALCIUM 8.7*   GFR: Estimated Creatinine Clearance: 38.5 mL/min (A) (by C-G formula based on SCr of 1.58 mg/dL (H)). Liver Function Tests: Recent Labs  Lab 03/11/23 2300  AST 25  ALT 19  ALKPHOS 53  BILITOT 0.9  PROT 7.0  ALBUMIN 2.7*   No results for input(s): "LIPASE", "AMYLASE" in the last 168 hours. No results for  input(s): "AMMONIA" in the last 168 hours. Coagulation Profile: No results for input(s): "INR", "PROTIME" in the last 168 hours. Cardiac Enzymes: No results for input(s): "CKTOTAL", "CKMB", "CKMBINDEX", "TROPONINI" in the last 168 hours. BNP (last 3 results) No results for input(s): "PROBNP" in the last 8760 hours. HbA1C: No results for input(s): "HGBA1C" in the last 72 hours. CBG: No results for input(s): "GLUCAP" in the last 168 hours. Lipid Profile: No results for input(s): "CHOL", "HDL", "LDLCALC", "TRIG", "CHOLHDL", "LDLDIRECT" in the last 72 hours. Thyroid Function Tests: No results for input(s): "TSH", "T4TOTAL", "FREET4", "T3FREE", "THYROIDAB" in the last 72 hours. Anemia Panel: No results for input(s): "VITAMINB12", "FOLATE", "FERRITIN", "TIBC", "IRON", "RETICCTPCT" in the last 72 hours. Urine analysis:    Component Value Date/Time   COLORURINE YELLOW 06/15/2010 1116   APPEARANCEUR CLEAR 06/15/2010 1116   LABSPEC 1.015 06/15/2010 1116   PHURINE 7.0 06/15/2010 1116   GLUCOSEU NEGATIVE 06/15/2010 1116   HGBUR NEGATIVE 06/15/2010 1116   BILIRUBINUR NEGATIVE 06/15/2010 1116   KETONESUR NEGATIVE 06/15/2010 1116   PROTEINUR NEGATIVE 06/15/2010 1116   UROBILINOGEN 0.2 06/15/2010 1116   NITRITE NEGATIVE 06/15/2010 1116   LEUKOCYTESUR  06/15/2010 1116    NEGATIVE MICROSCOPIC NOT DONE ON URINES WITH NEGATIVE PROTEIN, BLOOD, LEUKOCYTES, NITRITE, OR GLUCOSE <1000 mg/dL.    Radiological Exams on Admission: DG CHEST PORT 1 VIEW Result Date: 03/11/2023 CLINICAL DATA:  Shortness of breath and hypoxia EXAM: PORTABLE CHEST 1 VIEW COMPARISON:  06/15/2010 radiograph and 01/18/2022 CTA chest FINDINGS: Bilateral hazy airspace and interstitial opacities with confluence airspace opacity in the left mid lung laterally. Cardiomegaly. No pleural effusion or pneumothorax. No displaced rib fractures. IMPRESSION:  Findings favor multifocal pneumonia. Electronically Signed   By: Minerva Fester M.D.    On: 03/11/2023 23:06    EKG: Independently reviewed. See above   Assessment/Plan  CAP presumed bacterial associated with Acute on Chronic hypoxic respiratory failure -patient with increase wbc and  Opacities on cxr   -ctx/ azithromycin, de-escalate as able  -pulmonary toilet  -urine ag, sputum, f/u on culture data  -wean O2 back to baseline   AECOPD IPF exacerbation  -iv steroids  -taper as able  - resume controller medications  - nebs prn  -consider pulmonary consult in am     CKDIIIb - at baseline   GERD -ppi  OSA -cpap at bedtime/prn   Hypertension -resume Micardis, metoprolol, amlodipine once med rec completed  HLD -continue statin    Hypothyroidism -continue synthroid     DVT prophylaxis: heparin Code Status: full/ as discussed per patient wishes in event of cardiac arrest  Family Communication: none at bedside Disposition Plan: patient  expected to be admitted greater than 2 midnights  Consults called: n/a Admission status: progressive care    Lurline Del MD [facility]   If 7PM-7AM, please contact night-coverage [email or web address] Password [email or web address]  03/12/2023, 12:20 AM

## 2023-03-12 NOTE — TOC Initial Note (Addendum)
Transition of Care [facility]) - Initial/Assessment Note    Patient Details  Name: Douglas [name] MRN: [id number] Date of Birth: [birth year]  [facility] CM/SW Contact:    Leone Haven, RN Phone Number: 03/12/2023, 4:00 PM  Clinical Narrative:                 From home with spouse, has PCP and insurance on file, states has no HH services in place at this time , has cpap machine at home.  States wife  will transport him  home at [employer] and family is support system, states gets medications from [facility] at [place].  Pta self ambulatory. Wife is there with him all the time and she is able to assist if needed per patient.   Expected Discharge Plan: Home/Self Care Barriers to Discharge: Continued Medical Work up   Patient Goals and CMS Choice Patient states their goals for this hospitalization and ongoing recovery are:: return home   Choice offered to / list presented to : NA      Expected Discharge Plan and Services In-house Referral: NA Discharge Planning Services: CM Consult Post Acute Care Choice: NA Living arrangements for the past 2 months: Single Family Home                 DME Arranged: N/A DME Agency: NA         HH Agency: NA        Prior Living Arrangements/Services Living arrangements for the past 2 months: Single Family Home Lives with:: Spouse Patient language and need for interpreter reviewed:: Yes Do you feel safe going back to the place where you live?: Yes      Need for Family Participation in Patient Care: Yes (Comment) Care giver support system in place?: Yes (comment) Current home services: DME (cpap) Criminal Activity/Legal Involvement Pertinent to Current Situation/Hospitalization: No - Comment as needed  Activities of Daily Living   ADL Screening (condition at time of admission) Independently performs ADLs?: Yes (appropriate for developmental age) Is the patient deaf or have difficulty hearing?: Yes Does the patient  have difficulty seeing, even when wearing glasses/contacts?: No Does the patient have difficulty concentrating, remembering, or making decisions?: No  Permission Sought/Granted                  Emotional Assessment Appearance:: Appears stated age Attitude/Demeanor/Rapport: Engaged Affect (typically observed): Appropriate Orientation: : Oriented to Self, Oriented to Place, Oriented to  Time, Oriented to Situation   Psych Involvement: No (comment)  Admission diagnosis:  Hypoxia [R09.02] CAP (community acquired pneumonia) [J18.9] Multifocal pneumonia [J18.9] Patient Active Problem List   Diagnosis Date Noted   CAP (community acquired pneumonia) 03/12/2023   Multifocal pneumonia 03/12/2023   Osteoporosis 07/27/2021   Interstitial pulmonary fibrosis (HCC) 05/02/2021   History of ITP 05/02/2021   OSA (obstructive sleep apnea) 05/02/2021   Thrombocytopenia (HCC) 07/10/2020   Allergic reaction 05/10/2019   Intolerance, food 05/10/2019   Dysphagia 05/10/2019   Hiatal hernia 02/19/2011   Diastasis recti 02/19/2011   ANEMIA, IRON DEFICIENCY 04/04/2010   GERD 04/04/2010   PCP:  Rodrigo Ran, MD Pharmacy:   [facility] [place], [place] - [address] [facility] [address] [place] [address] Phone: [phone] Fax: [phone]     Social Drivers of Health (SDOH) Social History: SDOH Screenings   Food Insecurity: No Food Insecurity (03/12/2023)  Housing: Low Risk  (03/12/2023)  Transportation Needs: No  Transportation Needs (03/12/2023)  Utilities: Not At Risk (03/12/2023)  Alcohol Screen: Low Risk  (09/10/2021)  Depression (PHQ2-9): Low Risk  (09/10/2021)  Physical Activity: Sufficiently Active (09/10/2021)  Tobacco Use: Medium Risk (03/12/2023)   SDOH Interventions:     Readmission Risk Interventions     No data to display

## 2023-03-12 NOTE — Progress Notes (Signed)
Patient was seen and examined.  Still in the emergency room.  Admitted early morning hours by nighttime hospitalist.  See assessment and plan, H&P done by nighttime hospitalist. In brief, 82 year old gentleman with history of hypertension, hyperlipidemia, hypothyroidism, pulmonary fibrosis with progressive disease followed up at [facility] pulmonology not on home oxygen comes to the hospital with about 1 month of progressive shortness of breath, few days of dry hacking cough and worse shortness of breath.  Hypoxemic and 70% on home pulse ox.  Went to urgent care.  Was sent to the ER.  In the emergency room initially on nonrebreather, currently on 3 L oxygen.  Chest x-ray with multifocal opacities.  CT scan done by his pulmonologist on 12/11 with extensive pulmonary fibrosis. Respiratory swab negative.  Lactic acid 0.8. Received Rocephin and azithromycin in the ER.  Cultures were drawn.  Not continued further antibiotics.  Idiopathic pulmonary fibrosis with exacerbation: Agree with admission to monitored unit because of severity of symptoms. Will continue bronchodilator therapy.  High-dose IV steroids. deep breathing exercises, incentive spirometry, chest physiotherapy.  Antibiotics are not currently indicated.  He received first dose of antibiotics last night.  Will check procalcitonin. Supplemental oxygen to keep saturations 89 to 90%. Evaluate for home oxygen need.  Anticipate home on prednisone and oxygen with outpatient follow-up with his pulmonologist.  Detail discussed and updated patient and wife at the bedside.   Same-day admit.  No charge visit.

## 2023-03-13 ENCOUNTER — Encounter (HOSPITAL_COMMUNITY): Payer: Self-pay | Admitting: Internal Medicine

## 2023-03-13 DIAGNOSIS — J189 Pneumonia, unspecified organism: Secondary | ICD-10-CM | POA: Diagnosis not present

## 2023-03-13 DIAGNOSIS — J432 Centrilobular emphysema: Secondary | ICD-10-CM

## 2023-03-13 DIAGNOSIS — J849 Interstitial pulmonary disease, unspecified: Secondary | ICD-10-CM

## 2023-03-13 DIAGNOSIS — J9601 Acute respiratory failure with hypoxia: Secondary | ICD-10-CM | POA: Diagnosis not present

## 2023-03-13 LAB — CBC
HCT: 45.9 % (ref 39.0–52.0)
Hemoglobin: 15.2 g/dL (ref 13.0–17.0)
MCH: 28.8 pg (ref 26.0–34.0)
MCHC: 33.1 g/dL (ref 30.0–36.0)
MCV: 87.1 fL (ref 80.0–100.0)
Platelets: 445 10*3/uL — ABNORMAL HIGH (ref 150–400)
RBC: 5.27 MIL/uL (ref 4.22–5.81)
RDW: 14.9 % (ref 11.5–15.5)
WBC: 19.9 10*3/uL — ABNORMAL HIGH (ref 4.0–10.5)
nRBC: 0 % (ref 0.0–0.2)

## 2023-03-13 LAB — SEDIMENTATION RATE: Sed Rate: 55 mm/h — ABNORMAL HIGH (ref 0–16)

## 2023-03-13 LAB — BASIC METABOLIC PANEL
Anion gap: 10 (ref 5–15)
BUN: 26 mg/dL — ABNORMAL HIGH (ref 8–23)
CO2: 22 mmol/L (ref 22–32)
Calcium: 9.2 mg/dL (ref 8.9–10.3)
Chloride: 104 mmol/L (ref 98–111)
Creatinine, Ser: 1.31 mg/dL — ABNORMAL HIGH (ref 0.61–1.24)
GFR, Estimated: 55 mL/min — ABNORMAL LOW (ref >60)
Glucose, Bld: 148 mg/dL — ABNORMAL HIGH (ref 70–99)
Potassium: 4 mmol/L (ref 3.5–5.1)
Sodium: 136 mmol/L (ref 135–145)

## 2023-03-13 LAB — LEGIONELLA PNEUMOPHILA SEROGP 1 UR AG: L. pneumophila Serogp 1 Ur Ag: NEGATIVE

## 2023-03-13 LAB — HIV ANTIBODY (ROUTINE TESTING W REFLEX): HIV Screen 4th Generation wRfx: NONREACTIVE

## 2023-03-13 LAB — C-REACTIVE PROTEIN: CRP: 4.9 mg/dL — ABNORMAL HIGH

## 2023-03-13 MED ORDER — SODIUM CHLORIDE 0.9 % IV SOLN
2.0000 g | INTRAVENOUS | Status: DC
  Administered 2023-03-13: 2 g via INTRAVENOUS
  Filled 2023-03-13: qty 20

## 2023-03-13 MED ORDER — AZITHROMYCIN 250 MG PO TABS
500.0000 mg | ORAL_TABLET | Freq: Every day | ORAL | Status: DC
  Administered 2023-03-13 – 2023-03-14 (×2): 500 mg via ORAL
  Filled 2023-03-13 (×2): qty 2

## 2023-03-13 NOTE — Progress Notes (Signed)
Nurse requested Mobility Specialist to perform oxygen saturation test with pt which includes removing pt from oxygen both at rest and while ambulating.  Below are the results from that testing.     Patient Saturations on Room Air at Rest = spO2 98%  Patient Saturations on Room Air while Ambulating = sp02 85% .   Patient Saturations on 2 Liters of oxygen while Ambulating = sp02 91%  At end of testing pt left in room on 1  Liters of oxygen.  Reported results to nurse.   Addison Lank Mobility Specialist Please contact via SecureChat or  Rehab office at [phone]

## 2023-03-13 NOTE — Progress Notes (Signed)
PROGRESS NOTE    Douglas [name]  IHK:[id number] DOB: 10-Nov-1940 DOA: 03/11/2023 PCP: Douglas Ran, MD  Chief Complaint  Patient presents with   Shortness of Breath    Brief Narrative:   Douglas [name] is Douglas [name] 82 y.o. male with medical history significant of  pulmonary fibrosis, COPD, Hypertension, HLD, hypothyroidism, CKDIIIb and multiple other medical issues who presented to UC with complaints of progressive SOB.  ON evaluation at [facility] patient was note to be hypoxic to 70's on room air and that juncture patient placed on NRB and transported to the ED.  Currently being treated for CAP and possible IPF flare.   Assessment & Plan:   Principal Problem:   CAP (community acquired pneumonia) Active Problems:   Multifocal pneumonia  Acute Hypoxic Respiratory Failure Community Acquired Pneumonia - worsening symptoms past 3 days, no clear sick contacts - CXR with bilateral hazy airspace and interstitial opacities with confluence airspace opacity in L mid lung laterally - reported chills and temp to 100.3 at urgent care, also presented with leukocytosis and WBC ~14 -> clinically suspect pneumonia despite unimpressive procalcitonin - ceftriaxone, azithromycin  - steroids given below - negative MRSA PCR, negative urine strep.  Pending legionella.   - RVP negative, covid negative  - blood cultures pending - wean O2 as tolerated   COPD IPF with possible exacerbation - Symptoms worsening in past month.  Prior to that, he was going to gym 3 times Douglas [name] week.  Prior to this illness, in the past month, he was still getting around home with relative ease.  No chronic O2 use.  - no wheezing on exam, bibasilar crackles - treating for pneumonia as well as possible IPF flare with steroids  - wean O2 as tolerated - appreciate pulmonology assistance  - has pulmonology appt outpatient coming this Monday, thankfully   CKDIIIb - at baseline    GERD -ppi   OSA -cpap at bedtime/prn     Hypertension -continue arb, amlodipine   HLD -continue statin     Hypothyroidism -continue synthroid   Glaucoma - home eye drops  TRT - home testosterone on hold    DVT prophylaxis: SCD (alphagal hx, avoiding lovenox/heparin) Code Status: full Family Communication: wife at bedside Disposition:   Status is: Inpatient Remains inpatient appropriate because: need for continued inpatient care   Consultants:  pulmonology  Procedures:  none  Antimicrobials:  Anti-infectives (From admission, onward)    Start     Dose/Rate Route Frequency Ordered Stop   03/11/23 2315  cefTRIAXone (ROCEPHIN) 1 g in sodium chloride 0.9 % 100 mL IVPB        1 g 200 mL/hr over 30 Minutes Intravenous  Once 03/11/23 2314 03/12/23 0022   03/11/23 2315  azithromycin (ZITHROMAX) 500 mg in sodium chloride 0.9 % 250 mL IVPB        500 mg 250 mL/hr over 60 Minutes Intravenous  Once 03/11/23 2314 03/12/23 0159       Subjective: Feels Douglas [name] little better  Objective: Vitals:   03/13/23 0108 03/13/23 0443 03/13/23 0809 03/13/23 0945  BP: (!) 116/59 113/69 112/69   Pulse: (!) 43 (!) 51    Resp:  19 20   Temp: 97.7 F (36.5 C) 97.6 F (36.4 C) 97.9 F (36.6 C)   TempSrc: Oral Oral Oral   SpO2: 99% 99%  99%  Weight:      Height:        Intake/Output Summary (Last 24 hours) at 03/13/2023 1021 Last  data filed at 03/13/2023 0450 Gross per 24 hour  Intake 240 ml  Output 1325 ml  Net -1085 ml   Filed Weights   03/11/23 2221 03/13/23 0016  Weight: 75.8 kg 77.8 kg    Examination:  General exam: Appears calm and comfortable  Respiratory system: bibasilar crackles  Cardiovascular system: regular, brady Gastrointestinal system: Abdomen is nondistended, soft and nontender. Central nervous system: Alert and oriented. No focal neurological deficits. Extremities: no LEE   Data Reviewed: I have personally reviewed following labs and imaging studies  CBC: Recent Labs  Lab 03/11/23 2300  03/12/23 0122 03/12/23 0248  WBC 14.8* 14.1*  --   NEUTROABS 13.0*  --   --   HGB 14.2 14.5 14.3  HCT 43.5 43.5 42.0  MCV 87.2 86.7  --   PLT 349 356  --     Basic Metabolic Panel: Recent Labs  Lab 03/11/23 2300 03/12/23 0122 03/12/23 0248  NA 131* 132* 137  K 3.8 4.0 3.9  CL 105 104  --   CO2 17* 19*  --   GLUCOSE 162* 158*  --   BUN 18 17  --   CREATININE 1.58* 1.52*  --   CALCIUM 8.7* 8.7*  --     GFR: Estimated Creatinine Clearance: 40 mL/min (Douglas [name]) (by C-G formula based on SCr of 1.52 mg/dL (H)).  Liver Function Tests: Recent Labs  Lab 03/11/23 2300 03/12/23 0122  AST 25 24  ALT 19 18  ALKPHOS 53 54  BILITOT 0.9 0.7  PROT 7.0 7.2  ALBUMIN 2.7* 2.6*    CBG: No results for input(s): "GLUCAP" in the last 168 hours.   Recent Results (from the past 240 hours)  Resp panel by RT-PCR (RSV, Flu Douglas [name]&B, Covid) Anterior Nasal Swab     Status: None   Collection Time: 03/11/23 11:00 PM   Specimen: Anterior Nasal Swab  Result Value Ref Range Status   SARS Coronavirus 2 by RT PCR NEGATIVE NEGATIVE Final   Influenza Douglas [name] by PCR NEGATIVE NEGATIVE Final   Influenza B by PCR NEGATIVE NEGATIVE Final    Comment: (NOTE) The Xpert Xpress SARS-CoV-2/FLU/RSV plus assay is intended as an aid in the diagnosis of influenza from Nasopharyngeal swab specimens and should not be used as Douglas [name] sole basis for treatment. Nasal washings and aspirates are unacceptable for Xpert Xpress SARS-CoV-2/FLU/RSV testing.  Fact Sheet for Patients: BloggerCourse.com  Fact Sheet for Healthcare Providers: SeriousBroker.it  This test is not yet approved or cleared by the [place] FDA and has been authorized for detection and/or diagnosis of SARS-CoV-2 by FDA under an Emergency Use Authorization (EUA). This EUA will remain in effect (meaning this test can be used) for the duration of the COVID-19 declaration under Section 564(b)(1) of the Act, 21  U.S.C. section 360bbb-3(b)(1), unless the authorization is terminated or revoked.     Resp Syncytial Virus by PCR NEGATIVE NEGATIVE Final    Comment: (NOTE) Fact Sheet for Patients: BloggerCourse.com  Fact Sheet for Healthcare Providers: SeriousBroker.it  This test is not yet approved or cleared by the [place] FDA and has been authorized for detection and/or diagnosis of SARS-CoV-2 by FDA under an Emergency Use Authorization (EUA). This EUA will remain in effect (meaning this test can be used) for the duration of the COVID-19 declaration under Section 564(b)(1) of the Act, 21 U.S.C. section 360bbb-3(b)(1), unless the authorization is terminated or revoked.  Performed at [facility], [address] [place], [place] [address]  Respiratory (~20 pathogens) panel by PCR     Status: None   Collection Time: 03/12/23 12:33 AM   Specimen: Nasopharyngeal Swab; Respiratory  Result Value Ref Range Status   Adenovirus NOT DETECTED NOT DETECTED Final   Coronavirus 229E NOT DETECTED NOT DETECTED Final    Comment: (NOTE) The Coronavirus on the Respiratory Panel, DOES NOT test for the novel  Coronavirus (2019 nCoV)    Coronavirus HKU1 NOT DETECTED NOT DETECTED Final   Coronavirus NL63 NOT DETECTED NOT DETECTED Final   Coronavirus OC43 NOT DETECTED NOT DETECTED Final   Metapneumovirus NOT DETECTED NOT DETECTED Final   Rhinovirus / Enterovirus NOT DETECTED NOT DETECTED Final   Influenza Douglas [name] NOT DETECTED NOT DETECTED Final   Influenza B NOT DETECTED NOT DETECTED Final   Parainfluenza Virus 1 NOT DETECTED NOT DETECTED Final   Parainfluenza Virus 2 NOT DETECTED NOT DETECTED Final   Parainfluenza Virus 3 NOT DETECTED NOT DETECTED Final   Parainfluenza Virus 4 NOT DETECTED NOT DETECTED Final   Respiratory Syncytial Virus NOT DETECTED NOT DETECTED Final   Bordetella pertussis NOT DETECTED NOT DETECTED Final   Bordetella Parapertussis  NOT DETECTED NOT DETECTED Final   Chlamydophila pneumoniae NOT DETECTED NOT DETECTED Final   Mycoplasma pneumoniae NOT DETECTED NOT DETECTED Final    Comment: Performed at [facility], [address], [place], [place] [address]  MRSA Next Gen by PCR, Nasal     Status: None   Collection Time: 03/12/23  9:05 PM   Specimen: Nasal Mucosa; Nasal Swab  Result Value Ref Range Status   MRSA by PCR Next Gen NOT DETECTED NOT DETECTED Final    Comment: (NOTE) The GeneXpert MRSA Assay (FDA approved for NASAL specimens only), is one component of Ramell Wacha comprehensive MRSA colonization surveillance program. It is not intended to diagnose MRSA infection nor to guide or monitor treatment for MRSA infections. Test performance is not FDA approved in patients less than 59 years old. Performed at [facility], [address], [place], [place] [address]          Radiology Studies: DG CHEST PORT 1 VIEW Result Date: 03/11/2023 CLINICAL DATA:  Shortness of breath and hypoxia EXAM: PORTABLE CHEST 1 VIEW COMPARISON:  06/15/2010 radiograph and 01/18/2022 CTA chest FINDINGS: Bilateral hazy airspace and interstitial opacities with confluence airspace opacity in the left mid lung laterally. Cardiomegaly. No pleural effusion or pneumothorax. No displaced rib fractures. IMPRESSION: Findings favor multifocal pneumonia. Electronically Signed   By: Minerva Fester M.D.   On: 03/11/2023 23:06        Scheduled Meds:  amLODipine  5 mg Oral QHS   cholecalciferol  4,000 Units Oral Daily   dorzolamide  1 drop Right Eye BID   And   timolol  1 drop Right Eye BID   fluticasone furoate-vilanterol  1 puff Inhalation Daily   And   umeclidinium bromide  1 puff Inhalation Daily   guaiFENesin  600 mg Oral BID   ipratropium-albuterol  3 mL Nebulization BID   irbesartan  300 mg Oral Daily   levothyroxine  125 mcg Oral q morning   methylPREDNISolone (SOLU-MEDROL) injection  40 mg Intravenous Q12H   pantoprazole   40 mg Oral Daily   simvastatin  40 mg Oral QHS   Continuous Infusions:   LOS: 1 day    Time spent: over 30 min    Lacretia Nicks, MD [facility]   To contact the attending provider between 7A-7P or the covering  provider during after hours 7P-7A, please log into the web site [email or web address] and access using universal [place] password for that web site. If you do not have the password, please call the hospital operator.  03/13/2023, 10:21 AM

## 2023-03-13 NOTE — TOC Progression Note (Signed)
Transition of Care [facility]) - Progression Note    Patient Details  Name: Douglas [name] MRN: [id number] Date of Birth: Apr 12, 1940  Transition of Care [facility]) CM/SW Contact  Leone Haven, RN Phone Number: 03/13/2023, 4:34 PM  Clinical Narrative:    Patient will need home oxygen at dc, he and wife are ok with [employer] supplying the oxygen, Referral made to [facility] with [employer].  Awaiting oxygen order.    Expected Discharge Plan: Home/Self Care Barriers to Discharge: Continued Medical Work up  Expected Discharge Plan and Services In-house Referral: NA Discharge Planning Services: CM Consult Post Acute Care Choice: NA Living arrangements for the past 2 months: Single Family Home                 DME Arranged: N/A DME Agency: NA         HH Agency: NA         Social Determinants of Health (SDOH) Interventions SDOH Screenings   Food Insecurity: No Food Insecurity (03/12/2023)  Housing: Low Risk  (03/12/2023)  Transportation Needs: No Transportation Needs (03/12/2023)  Utilities: Not At Risk (03/12/2023)  Alcohol Screen: Low Risk  (09/10/2021)  Depression (PHQ2-9): Low Risk  (09/10/2021)  Physical Activity: Sufficiently Active (09/10/2021)  Tobacco Use: Medium Risk (03/12/2023)    Readmission Risk Interventions     No data to display

## 2023-03-13 NOTE — Progress Notes (Signed)
Mobility Specialist Progress Note:   03/13/23 1200  Mobility  Activity Ambulated with assistance in hallway  Level of Assistance Contact guard assist, steadying assist  Assistive Device None  Distance Ambulated (ft) 400 ft  Activity Response Tolerated well  Mobility Referral Yes  Mobility visit 1 Mobility  Mobility Specialist Start Time (ACUTE ONLY) 1155  Mobility Specialist Stop Time (ACUTE ONLY) 1208  Mobility Specialist Time Calculation (min) (ACUTE ONLY) 13 min   Pt agreeable to mobility session. Required only minG assist during ambulation. Attempted ambulation on RA, pt SpO2 86% after ~129ft. Attempted 1L, SpO2 88% and VSS on 2LO2. Pt denies SOB throughout, left sitting EOB with all needs met. Eating lunch.  Addison Lank Mobility Specialist Please contact via SecureChat or  Rehab office at [phone]

## 2023-03-13 NOTE — Consult Note (Addendum)
NAME:  Douglas [name], MRN:  [id number], DOB:  05-14-1940, LOS: 1 ADMISSION DATE:  03/11/2023, CONSULTATION DATE:  12/19 REFERRING MD:  Dr. Lowell Guitar, CHIEF COMPLAINT:  pna; ipf flare?   History of Present Illness:  Patient is a 82 year old male with pertinent PMH pulmonary fibrosis (followed with [facility]), COPD, OSA on CPAP presents to [facility] on 12/19 with SOB.  Patient not on oxygen at home.  Never been hospitalized for IPF. Patient recently treated with a course of steroids back in November for SOB with improvement.  For the past 2 weeks patient with increased WOB and noted to have fever/chills.  Patient with increasing SOB and came to [facility] ED on 12/17 for further workup.  Found to be hypoxic 70s on room air.  Placed on NRB of improvement.  CXR showing multifocal pneumonia.  Started on Rocephin/azithromycin for CAP coverage.  Given concern for possible IPF flare patient started on steroids.  COVID, RVP negative.  MRSA PCR and urine strep negative.  PCT <0.1. expectorated sputum, cultures, and Legionella pending. Patient now weaned to 2L [place].  PCCM consulted given concern for possible IPF flare.    Pertinent  Medical History   Past Medical History:  Diagnosis Date   Allergic rhinitis    Allergy    seasonal   Anal fissure    Anemia    Bilateral inguinal hernia    Chest pain    Chronic kidney disease    h/o- renal calculi- passed spont. , 10 episodes over the course of 20 yrs.    Colon polyps    GERD (gastroesophageal reflux disease)    Hyperlipidemia    Hypertension    Hypogonadism in male    Hypothyroidism    Kidney stones    Osteoporosis    Scrotal mass    Sebaceous cyst    Sleep apnea    CPAP q night - study done 8-10 yrs. ago    Testosterone deficiency    injections q 3 week- July 8- last injection    Thyroid disease    Vitamin D deficiency      Significant Hospital Events: Including procedures, antibiotic start and stop dates in addition to other pertinent events    12/19 admitted w/ pna; concern for ipf flare; pccm consulted  Interim History / Subjective:  Patient in no acute distress walking around with no SOB.   On 2L [place]  Objective   Blood pressure 112/69, pulse (!) 51, temperature 97.9 F (36.6 C), temperature source Oral, resp. rate 20, height 5\' 10"  (1.778 m), weight 77.8 kg, SpO2 99%.        Intake/Output Summary (Last 24 hours) at 03/13/2023 1025 Last data filed at 03/13/2023 0450 Gross per 24 hour  Intake 240 ml  Output 1325 ml  Net -1085 ml   Filed Weights   03/11/23 2221 03/13/23 0016  Weight: 75.8 kg 77.8 kg    Examination: General:  NAD HEENT: MM pink/moist; [place] in place Neuro: Aox3; MAE CV: s1s2, RRR, no m/r/g PULM:  dim crackles BS bilaterally; [place] 2L GI: soft, bsx4 active  Extremities: warm/dry, no edema  Skin: no rashes or lesions    Resolved Hospital Problem list     Assessment & Plan:  Acute respiratory failure with hypoxia Hx of COPD: on trelegy Concern for possible IPF flare Plan: -Continue  and wean for sats greater than 92% -pulm toiletry: IS/Flutter -Continue Rocephin/azithromycin for CAP coverage -Legionella pending -Follow expectorated sputum and BC x 2 -Continue home  triple therapy inhaler -Continue steroids for now -check esr and crp -Consider CT chest -has outpt f/u appt this coming Monday at w/ pulmonary at [facility]  OSA on CPAP Plan: -CPAP nightly  HTN HLD GERD Hypothyroidism CKD 3B Plan: -Per primary   Best Practice (right click and "Reselect all SmartList Selections" daily)   Per primary  Labs   CBC: Recent Labs  Lab 03/11/23 2300 03/12/23 0122 03/12/23 0248  WBC 14.8* 14.1*  --   NEUTROABS 13.0*  --   --   HGB 14.2 14.5 14.3  HCT 43.5 43.5 42.0  MCV 87.2 86.7  --   PLT 349 356  --     Basic Metabolic Panel: Recent Labs  Lab 03/11/23 2300 03/12/23 0122 03/12/23 0248  NA 131* 132* 137  K 3.8 4.0 3.9  CL 105 104  --   CO2 17* 19*  --   GLUCOSE  162* 158*  --   BUN 18 17  --   CREATININE 1.58* 1.52*  --   CALCIUM 8.7* 8.7*  --    GFR: Estimated Creatinine Clearance: 40 mL/min (A) (by C-G formula based on SCr of 1.52 mg/dL (H)). Recent Labs  Lab 03/11/23 2300 03/11/23 2348 03/12/23 0122 03/12/23 1021  PROCALCITON  --   --   --  0.10  WBC 14.8*  --  14.1*  --   LATICACIDVEN  --  0.8 0.8  --     Liver Function Tests: Recent Labs  Lab 03/11/23 2300 03/12/23 0122  AST 25 24  ALT 19 18  ALKPHOS 53 54  BILITOT 0.9 0.7  PROT 7.0 7.2  ALBUMIN 2.7* 2.6*   No results for input(s): "LIPASE", "AMYLASE" in the last 168 hours. No results for input(s): "AMMONIA" in the last 168 hours.  ABG    Component Value Date/Time   PHART 7.417 03/12/2023 0248   PCO2ART 30.9 (L) 03/12/2023 0248   PO2ART 78 (L) 03/12/2023 0248   HCO3 19.9 (L) 03/12/2023 0248   TCO2 21 (L) 03/12/2023 0248   ACIDBASEDEF 4.0 (H) 03/12/2023 0248   O2SAT 96 03/12/2023 0248     Coagulation Profile: No results for input(s): "INR", "PROTIME" in the last 168 hours.  Cardiac Enzymes: No results for input(s): "CKTOTAL", "CKMB", "CKMBINDEX", "TROPONINI" in the last 168 hours.  HbA1C: Hgb A1c MFr Bld  Date/Time Value Ref Range Status  03/12/2023 01:22 AM 7.2 (H) 4.8 - 5.6 % Final    Comment:    (NOTE) Pre diabetes:          5.7%-6.4%  Diabetes:              >6.4%  Glycemic control for   <7.0% adults with diabetes     CBG: No results for input(s): "GLUCAP" in the last 168 hours.  Review of Systems:   Review of Systems  Constitutional:  Positive for chills and fever.  Respiratory:  Positive for shortness of breath. Negative for wheezing.   Cardiovascular:  Negative for chest pain.  Gastrointestinal:  Negative for abdominal pain, nausea and vomiting.     Past Medical History:  He,  has a past medical history of Allergic rhinitis, Allergy, Anal fissure, Anemia, Bilateral inguinal hernia, Chest pain, Chronic kidney disease, Colon polyps, GERD  (gastroesophageal reflux disease), Hyperlipidemia, Hypertension, Hypogonadism in male, Hypothyroidism, Kidney stones, Osteoporosis, Scrotal mass, Sebaceous cyst, Sleep apnea, Testosterone deficiency, Thyroid disease, and Vitamin D deficiency.   Surgical History:   Past Surgical History:  Procedure Laterality Date  ANAL FISSURE REPAIR     CHOLECYSTECTOMY     COLONOSCOPY     FIBULA FRACTURE SURGERY     fibula and tibia fracture    FRACTURE SURGERY     L leg -plate & screws - ORIF- fibula   HEMORRHOID SURGERY     HERNIA REPAIR  04/19/2011   bilateral   LAPAROSCOPIC INGUINAL HERNIA REPAIR  04/19/2011   bilateral   LAPAROSCOPY  10/11/2011   Procedure: LAPAROSCOPY DIAGNOSTIC;  Surgeon: Ardeth Sportsman, MD;  Location: [facility] OR;  Service: General;  Laterality: N/A;  UMBILICAL EXPLORATION   MASS EXCISION  10/11/2011   Procedure: EXCISION MASS;  Surgeon: Ardeth Sportsman, MD;  Location: [facility] OR;  Service: General;  Laterality: N/A;  EXCISION OF UMBILICAL MASS   POLYPECTOMY     UPPER GASTROINTESTINAL ENDOSCOPY       Social History:   reports that he quit smoking about 24 years ago. His smoking use included cigarettes. He has never used smokeless tobacco. He reports that he does not drink alcohol and does not use drugs.   Family History:  His family history includes Stroke in his father and mother. There is no history of Colon cancer, Rectal cancer, Stomach cancer, Esophageal cancer, Colon polyps, Allergic rhinitis, Asthma, Angioedema, Atopy, Eczema, Immunodeficiency, or Urticaria.   Allergies Allergies  Allergen Reactions   Alpha-Gal Anaphylaxis    Anaphylaxis (ALLERGY) (Anaphylaxis), Cough (ALLERGY/intolerance), Respiratory Distress (ALLERGY/intolerance), Shortness Of Breath (ALLERGY/intolerance) (Shortness Of Breath), Wheezing (ALLERGY/intolerance)   Beef-Derived Drug Products    Other Other (See Comments)   Pork-Derived Products      Home Medications  Prior to Admission medications    Medication Sig Start Date End Date Taking? Authorizing Provider  albuterol (VENTOLIN HFA) 108 (90 Base) MCG/ACT inhaler Inhale 2 puffs into the lungs every 6 (six) hours as needed. 01/28/23 01/28/24 Yes [provider]  amLODipine (NORVASC) 5 MG tablet Take 5 mg by mouth at bedtime.   Yes [provider]  cetirizine (ZYRTEC) 5 MG tablet Take 10 mg by mouth at bedtime.   Yes [provider]  Cholecalciferol 100 MCG (4000 UT) CAPS Take 4,000 Units by mouth daily.   Yes [provider]  denosumab (PROLIA) 60 MG/ML SOSY injection    Yes [provider]  Dorzolamide HCl-Timolol Mal PF 2-0.5 % SOLN Apply 2 drops to eye daily.   Yes [provider]  EPINEPHrine 0.3 mg/0.3 mL IJ SOAJ injection Inject 0.3 mg into the muscle as needed for anaphylaxis.   Yes [provider]  fish oil-omega-3 fatty acids 1000 MG capsule Take 2 g by mouth daily.   Yes [provider]  levothyroxine (SYNTHROID) 125 MCG tablet Take 125 mcg by mouth every morning. 06/14/20  Yes [provider]  Magnesium 500 MG CAPS qd Oral 06/08/09  Yes [provider]  Multiple Vitamin (MULTIVITAMIN PO) Take by mouth daily.     Yes [provider]  pantoprazole (PROTONIX) 40 MG tablet Take 40 mg by mouth daily.   Yes [provider]  simvastatin (ZOCOR) 40 MG tablet Take 40 mg by mouth at bedtime.   Yes [provider]  telmisartan (MICARDIS) 80 MG tablet Take 80 mg by mouth at bedtime.   Yes [provider]  testosterone cypionate (DEPOTESTOTERONE CYPIONATE) 100 MG/ML injection Inject into the muscle every 21 ( twenty-one) days. For IM use only   Yes [provider]  TRELEGY ELLIPTA 100-62.5-25 MCG/ACT AEPB Inhale 1 puff into the  lungs daily.   Yes [provider]         JD Anselm Lis [facility] 03/13/2023, 10:25 AM  Please see [email or web address] for pager details.  From 7A-7P if  no response, please call [phone]. After hours, please call ELink [phone].

## 2023-03-14 DIAGNOSIS — J189 Pneumonia, unspecified organism: Secondary | ICD-10-CM | POA: Diagnosis not present

## 2023-03-14 LAB — CBC WITH DIFFERENTIAL/PLATELET
Abs Immature Granulocytes: 0.13 10*3/uL — ABNORMAL HIGH (ref 0.00–0.07)
Basophils Absolute: 0.1 10*3/uL (ref 0.0–0.1)
Basophils Relative: 0 %
Eosinophils Absolute: 0 10*3/uL (ref 0.0–0.5)
Eosinophils Relative: 0 %
HCT: 41.5 % (ref 39.0–52.0)
Hemoglobin: 13.8 g/dL (ref 13.0–17.0)
Immature Granulocytes: 1 %
Lymphocytes Relative: 6 %
Lymphs Abs: 1.1 10*3/uL (ref 0.7–4.0)
MCH: 28.8 pg (ref 26.0–34.0)
MCHC: 33.3 g/dL (ref 30.0–36.0)
MCV: 86.5 fL (ref 80.0–100.0)
Monocytes Absolute: 1 10*3/uL (ref 0.1–1.0)
Monocytes Relative: 6 %
Neutro Abs: 15 10*3/uL — ABNORMAL HIGH (ref 1.7–7.7)
Neutrophils Relative %: 87 %
Platelets: 383 10*3/uL (ref 150–400)
RBC: 4.8 MIL/uL (ref 4.22–5.81)
RDW: 14.9 % (ref 11.5–15.5)
WBC: 17.3 10*3/uL — ABNORMAL HIGH (ref 4.0–10.5)
nRBC: 0 % (ref 0.0–0.2)

## 2023-03-14 LAB — COMPREHENSIVE METABOLIC PANEL
ALT: 22 U/L (ref 0–44)
AST: 21 U/L (ref 15–41)
Albumin: 2.4 g/dL — ABNORMAL LOW (ref 3.5–5.0)
Alkaline Phosphatase: 52 U/L (ref 38–126)
Anion gap: 6 (ref 5–15)
BUN: 27 mg/dL — ABNORMAL HIGH (ref 8–23)
CO2: 23 mmol/L (ref 22–32)
Calcium: 8.6 mg/dL — ABNORMAL LOW (ref 8.9–10.3)
Chloride: 107 mmol/L (ref 98–111)
Creatinine, Ser: 1.32 mg/dL — ABNORMAL HIGH (ref 0.61–1.24)
GFR, Estimated: 55 mL/min — ABNORMAL LOW (ref >60)
Glucose, Bld: 146 mg/dL — ABNORMAL HIGH (ref 70–99)
Potassium: 4.1 mmol/L (ref 3.5–5.1)
Sodium: 136 mmol/L (ref 135–145)
Total Bilirubin: 0.4 mg/dL (ref <1.2)
Total Protein: 6.4 g/dL — ABNORMAL LOW (ref 6.5–8.1)

## 2023-03-14 LAB — MAGNESIUM: Magnesium: 2.2 mg/dL (ref 1.7–2.4)

## 2023-03-14 LAB — PHOSPHORUS: Phosphorus: 3.6 mg/dL (ref 2.5–4.6)

## 2023-03-14 MED ORDER — AZITHROMYCIN 500 MG PO TABS: 500.0000 mg | ORAL_TABLET | Freq: Every day | ORAL | 0 refills | Status: AC

## 2023-03-14 MED ORDER — AMOXICILLIN 500 MG PO CAPS: 1000.0000 mg | ORAL_CAPSULE | Freq: Three times a day (TID) | ORAL | 0 refills | Status: AC

## 2023-03-14 MED ORDER — PREDNISONE 10 MG PO TABS: ORAL_TABLET | ORAL | 0 refills | Status: AC

## 2023-03-14 NOTE — TOC Transition Note (Signed)
Transition of Care [facility]) - Discharge Note   Patient Details  Name: Douglas [name] MRN: [id number] Date of Birth: Oct 14, 1940  Transition of Care [facility]) CM/SW Contact:  Leone Haven, RN Phone Number: 03/14/2023, 10:09 AM   Clinical Narrative:    Patient does not qualify for home oxygen per the last oxygen saturation walk.  He is for dc today, his wife will transport him home.     Final next level of care: Home/Self Care Barriers to Discharge: Continued Medical Work up   Patient Goals and CMS Choice Patient states their goals for this hospitalization and ongoing recovery are:: return home   Choice offered to / list presented to : NA      Discharge Placement                       Discharge Plan and Services Additional resources added to the After Visit Summary for   In-house Referral: NA Discharge Planning Services: CM Consult Post Acute Care Choice: NA          DME Arranged: N/A DME Agency: NA         HH Agency: NA        Social Drivers of Health (SDOH) Interventions SDOH Screenings   Food Insecurity: No Food Insecurity (03/12/2023)  Housing: Low Risk  (03/12/2023)  Transportation Needs: No Transportation Needs (03/12/2023)  Utilities: Not At Risk (03/12/2023)  Alcohol Screen: Low Risk  (09/10/2021)  Depression (PHQ2-9): Low Risk  (09/10/2021)  Physical Activity: Sufficiently Active (09/10/2021)  Tobacco Use: Medium Risk (03/13/2023)     Readmission Risk Interventions     No data to display

## 2023-03-14 NOTE — Discharge Summary (Signed)
Physician Discharge Summary  Douglas [name] ZOX:[id number] DOB: [birth year] DOA: 03/11/2023  PCP: Rodrigo Ran, MD  Admit date: 03/11/2023 Discharge date: 03/14/2023  Time spent: 40 minutes  Recommendations for Outpatient Follow-up:  Follow outpatient CBC/CMP  Follow with pulmonology at scheduled appointment this Monday Complete course of antibiotics, 7 day total course 2 week steroid taper Needs follow up imaging outpatient   Discharge Diagnoses:  Principal Problem:   CAP (community acquired pneumonia) Active Problems:   Multifocal pneumonia   Acute respiratory failure with hypoxia [facility])   Discharge Condition: stable  Diet recommendation: heart healthy  Filed Weights   03/11/23 2221 03/13/23 0016 03/14/23 0543  Weight: 75.8 kg 77.8 kg 76.4 kg    History of present illness:   Douglas [name] is Douglas [name] 82 y.o. male with medical history significant of  pulmonary fibrosis, COPD, Hypertension, HLD, hypothyroidism, CKDIIIb and multiple other medical issues who presented to UC with complaints of progressive SOB.  ON evaluation at [facility] patient was note to be hypoxic to 70's on room air and that juncture patient placed on NRB and transported to the ED.   Currently being treated for CAP and possible IPF flare.  He's improved, currently on RA.  Will discharge with abx and steroid taper.   Hospital Course:  Assessment and Plan:  Acute Hypoxic Respiratory Failure Community Acquired Pneumonia - worsening symptoms past 3 days, no clear sick contacts - CXR with bilateral hazy airspace and interstitial opacities with confluence airspace opacity in L mid lung laterally - reported chills and temp to 100.3 at urgent care, also presented with leukocytosis and WBC ~14 -> clinically suspect pneumonia despite unimpressive procalcitonin - satting well on RA this morning, maintained O2 sats with ambulation  - ceftriaxone, azithromycin -> will discharge on augmentin/azithromycin, complete 7 day course  of antibiotics - steroids given below - negative MRSA PCR, negative urine strep.  Legionella negative - RVP negative, covid negative  - blood cultures NGx2 - wean O2 as tolerated   COPD IPF with possible exacerbation - Symptoms worsening in past month.  Prior to that, he was going to gym 3 times Daryan Cagley week.  Prior to this illness, in the past month, he was still getting around home with relative ease.  No chronic O2 use.  - no wheezing on exam, bibasilar crackles - treating for pneumonia as well as possible IPF flare with steroids  - wean O2 as tolerated - appreciate pulmonology assistance - recommending follow up with outpatient pulmonology, 2 week steroid taper, continue home triple therapy - has pulmonology appt outpatient coming this Monday, thankfully    CKDIIIb - at baseline    GERD -ppi   OSA -cpap at bedtime/prn    Hypertension -continue arb, amlodipine   HLD -continue statin     Hypothyroidism -continue synthroid    Glaucoma - home eye drops   TRT - testosterone    Procedures: none   Consultations: pulm  Discharge Exam: Vitals:   03/14/23 0727 03/14/23 0904  BP: 108/76   Pulse: (!) 42   Resp: 20   Temp: (!) 97.5 F (36.4 C)   SpO2: 96% 96%   Feeling progressively better  General: No acute distress. Cardiovascular: sinus brady on tele, regular Lungs: few rhonchi at bases, mostly on R Abdomen: Soft, nontender, nondistended Neurological: Alert and oriented 3. Moves all extremities 4 with equal strength. Cranial nerves II through XII grossly intact. Extremities: No clubbing or cyanosis. No edema.   Discharge Instructions   Discharge  Instructions     Call MD for:  difficulty breathing, headache or visual disturbances   Complete by: As directed    Call MD for:  extreme fatigue   Complete by: As directed    Call MD for:  hives   Complete by: As directed    Call MD for:  persistant dizziness or light-headedness   Complete by: As directed     Call MD for:  persistant nausea and vomiting   Complete by: As directed    Call MD for:  redness, tenderness, or signs of infection (pain, swelling, redness, odor or green/yellow discharge around incision site)   Complete by: As directed    Call MD for:  severe uncontrolled pain   Complete by: As directed    Call MD for:  temperature >100.4   Complete by: As directed    Diet - low sodium heart healthy   Complete by: As directed    Discharge instructions   Complete by: As directed    You were seen for community acquired pneumonia.  You've improved with antibiotics.  We also started you on steroids to treat [name] possible flare of your pulmonary fibrosis.    You've improved and currently are not requiring oxygen.  We'll send you with steroids to taper.  Start your amoxicillin today.  Start your azithromycin tomorrow.  Follow up with pulmonology as scheduled.  You'll need follow up chest imaging outpatient.   Return for new, recurrent, or worsening symptoms.  Please ask your PCP to request records from this hospitalization so they know what was done and what the next steps will be.   Increase activity slowly   Complete by: As directed       Allergies as of 03/14/2023       Reactions   Alpha-gal Anaphylaxis   Anaphylaxis (ALLERGY) (Anaphylaxis), Cough (ALLERGY/intolerance), Respiratory Distress (ALLERGY/intolerance), Shortness Of Breath (ALLERGY/intolerance) (Shortness Of Breath), Wheezing (ALLERGY/intolerance)   Beef-derived Drug Products    Other Other (See Comments)   Pork-derived Products         Medication List     TAKE these medications    albuterol 108 (90 Base) MCG/ACT inhaler Commonly known as: VENTOLIN HFA Inhale 2 puffs into the lungs every 6 (six) hours as needed.   amLODipine 5 MG tablet Commonly known as: NORVASC Take 5 mg by mouth at bedtime.   amoxicillin 500 MG capsule Commonly known as: AMOXIL Take 2 capsules (1,000 mg total) by mouth 3 (three)  times daily for 6 days.   azithromycin 500 MG tablet Commonly known as: ZITHROMAX Take 1 tablet (500 mg total) by mouth daily for 3 days. Start taking on: March 15, 2023   cetirizine 5 MG tablet Commonly known as: ZYRTEC Take 10 mg by mouth at bedtime.   Cholecalciferol 100 MCG (4000 UT) Caps Take 4,000 Units by mouth daily.   Dorzolamide HCl-Timolol Mal PF 2-0.5 % Soln Apply 2 drops to eye daily.   EPINEPHrine 0.3 mg/0.3 mL Soaj injection Commonly known as: EPI-PEN Inject 0.3 mg into the muscle as needed for anaphylaxis.   fish oil-omega-3 fatty acids 1000 MG capsule Take 2 g by mouth daily.   levothyroxine 125 MCG tablet Commonly known as: SYNTHROID Take 125 mcg by mouth every morning.   Magnesium 500 MG Caps qd Oral   MULTIVITAMIN PO Take by mouth daily.   pantoprazole 40 MG tablet Commonly known as: PROTONIX Take 40 mg by mouth daily.   predniSONE 10 MG tablet Commonly known  as: DELTASONE Take 4 tablets (40 mg total) by mouth daily for 5 days, THEN 3 tablets (30 mg total) daily for 3 days, THEN 2 tablets (20 mg total) daily for 3 days, THEN 1 tablet (10 mg total) daily for 3 days. Start taking on: March 14, 2023   Prolia 60 MG/ML Sosy injection Generic drug: denosumab   simvastatin 40 MG tablet Commonly known as: ZOCOR Take 40 mg by mouth at bedtime.   telmisartan 80 MG tablet Commonly known as: MICARDIS Take 80 mg by mouth at bedtime.   testosterone cypionate 100 MG/ML injection Commonly known as: DEPOTESTOTERONE CYPIONATE Inject into the muscle every 21 ( twenty-one) days. For IM use only   Trelegy Ellipta 100-62.5-25 MCG/ACT Aepb Generic drug: Fluticasone-Umeclidin-Vilant Inhale 1 puff into the lungs daily.       Allergies  Allergen Reactions   Alpha-Gal Anaphylaxis    Anaphylaxis (ALLERGY) (Anaphylaxis), Cough (ALLERGY/intolerance), Respiratory Distress (ALLERGY/intolerance), Shortness Of Breath (ALLERGY/intolerance) (Shortness Of  Breath), Wheezing (ALLERGY/intolerance)   Beef-Derived Drug Products    Other Other (See Comments)   Pork-Derived Products       The results of significant diagnostics from this hospitalization (including imaging, microbiology, ancillary and laboratory) are listed below for reference.    Significant Diagnostic Studies: DG CHEST PORT 1 VIEW Result Date: 03/11/2023 CLINICAL DATA:  Shortness of breath and hypoxia EXAM: PORTABLE CHEST 1 VIEW COMPARISON:  06/15/2010 radiograph and 01/18/2022 CTA chest FINDINGS: Bilateral hazy airspace and interstitial opacities with confluence airspace opacity in the left mid lung laterally. Cardiomegaly. No pleural effusion or pneumothorax. No displaced rib fractures. IMPRESSION: Findings favor multifocal pneumonia. Electronically Signed   By: Minerva Fester M.D.   On: 03/11/2023 23:06    Microbiology: Recent Results (from the past 240 hours)  Resp panel by RT-PCR (RSV, Flu Liahm Grivas&B, Covid) Anterior Nasal Swab     Status: None   Collection Time: 03/11/23 11:00 PM   Specimen: Anterior Nasal Swab  Result Value Ref Range Status   SARS Coronavirus 2 by RT PCR NEGATIVE NEGATIVE Final   Influenza Chalese Peach by PCR NEGATIVE NEGATIVE Final   Influenza B by PCR NEGATIVE NEGATIVE Final    Comment: (NOTE) The Xpert Xpress SARS-CoV-2/FLU/RSV plus assay is intended as an aid in the diagnosis of influenza from Nasopharyngeal swab specimens and should not be used as Rylee Huestis sole basis for treatment. Nasal washings and aspirates are unacceptable for Xpert Xpress SARS-CoV-2/FLU/RSV testing.  Fact Sheet for Patients: BloggerCourse.com  Fact Sheet for Healthcare Providers: SeriousBroker.it  This test is not yet approved or cleared by the [place] FDA and has been authorized for detection and/or diagnosis of SARS-CoV-2 by FDA under an Emergency Use Authorization (EUA). This EUA will remain in effect (meaning this test can be used)  for the duration of the COVID-19 declaration under Section 564(b)(1) of the Act, 21 U.S.C. section 360bbb-3(b)(1), unless the authorization is terminated or revoked.     Resp Syncytial Virus by PCR NEGATIVE NEGATIVE Final    Comment: (NOTE) Fact Sheet for Patients: BloggerCourse.com  Fact Sheet for Healthcare Providers: SeriousBroker.it  This test is not yet approved or cleared by the [place] FDA and has been authorized for detection and/or diagnosis of SARS-CoV-2 by FDA under an Emergency Use Authorization (EUA). This EUA will remain in effect (meaning this test can be used) for the duration of the COVID-19 declaration under Section 564(b)(1) of the Act, 21 U.S.C. section 360bbb-3(b)(1), unless the authorization is terminated or revoked.  Performed at [facility]  [facility], [address], [place], [place] [address]   Respiratory (~20 pathogens) panel by PCR     Status: None   Collection Time: 03/12/23 12:33 AM   Specimen: Nasopharyngeal Swab; Respiratory  Result Value Ref Range Status   Adenovirus NOT DETECTED NOT DETECTED Final   Coronavirus 229E NOT DETECTED NOT DETECTED Final    Comment: (NOTE) The Coronavirus on the Respiratory Panel, DOES NOT test for the novel  Coronavirus (2019 nCoV)    Coronavirus HKU1 NOT DETECTED NOT DETECTED Final   Coronavirus NL63 NOT DETECTED NOT DETECTED Final   Coronavirus OC43 NOT DETECTED NOT DETECTED Final   Metapneumovirus NOT DETECTED NOT DETECTED Final   Rhinovirus / Enterovirus NOT DETECTED NOT DETECTED Final   Influenza Kyrel Leighton NOT DETECTED NOT DETECTED Final   Influenza B NOT DETECTED NOT DETECTED Final   Parainfluenza Virus 1 NOT DETECTED NOT DETECTED Final   Parainfluenza Virus 2 NOT DETECTED NOT DETECTED Final   Parainfluenza Virus 3 NOT DETECTED NOT DETECTED Final   Parainfluenza Virus 4 NOT DETECTED NOT DETECTED Final   Respiratory Syncytial Virus NOT DETECTED NOT DETECTED  Final   Bordetella pertussis NOT DETECTED NOT DETECTED Final   Bordetella Parapertussis NOT DETECTED NOT DETECTED Final   Chlamydophila pneumoniae NOT DETECTED NOT DETECTED Final   Mycoplasma pneumoniae NOT DETECTED NOT DETECTED Final    Comment: Performed at [facility], [address] [place], [place] [address]  Culture, blood (routine x 2) Call MD if unable to obtain prior to antibiotics being given     Status: None (Preliminary result)   Collection Time: 03/12/23  1:22 AM   Specimen: BLOOD  Result Value Ref Range Status   Specimen Description BLOOD BLOOD LEFT HAND  Final   Special Requests   Final    BOTTLES DRAWN AEROBIC AND ANAEROBIC Blood Culture results may not be optimal due to an inadequate volume of blood received in culture bottles   Culture   Final    NO GROWTH 2 DAYS Performed at [facility], [address], [place], [place] [address]    Report Status PENDING  Incomplete  Culture, blood (routine x 2) Call MD if unable to obtain prior to antibiotics being given     Status: None (Preliminary result)   Collection Time: 03/12/23  1:22 AM   Specimen: BLOOD  Result Value Ref Range Status   Specimen Description BLOOD BLOOD RIGHT HAND  Final   Special Requests   Final    BOTTLES DRAWN AEROBIC AND ANAEROBIC Blood Culture adequate volume   Culture   Final    NO GROWTH 2 DAYS Performed at [facility], [address] [place], [place] [address]    Report Status PENDING  Incomplete  MRSA Next Gen by PCR, Nasal     Status: None   Collection Time: 03/12/23  9:05 PM   Specimen: Nasal Mucosa; Nasal Swab  Result Value Ref Range Status   MRSA by PCR Next Gen NOT DETECTED NOT DETECTED Final    Comment: (NOTE) The GeneXpert MRSA Assay (FDA approved for NASAL specimens only), is one component of Sigfredo Schreier comprehensive MRSA colonization surveillance program. It is not intended to diagnose MRSA infection nor to guide or monitor treatment for MRSA infections. Test  performance is not FDA approved in patients less than 70 years old. Performed at [facility], [address] [place], [place] [address]      Labs: Basic Metabolic Panel: Recent  Labs  Lab 03/11/23 2300 03/12/23 0122 03/12/23 0248 03/13/23 1218 03/14/23 0301  NA 131* 132* 137 136 136  K 3.8 4.0 3.9 4.0 4.1  CL 105 104  --  104 107  CO2 17* 19*  --  22 23  GLUCOSE 162* 158*  --  148* 146*  BUN 18 17  --  26* 27*  CREATININE 1.58* 1.52*  --  1.31* 1.32*  CALCIUM 8.7* 8.7*  --  9.2 8.6*  MG  --   --   --   --  2.2  PHOS  --   --   --   --  3.6   Liver Function Tests: Recent Labs  Lab 03/11/23 2300 03/12/23 0122 03/14/23 0301  AST 25 24 21   ALT 19 18 22   ALKPHOS 53 54 52  BILITOT 0.9 0.7 0.4  PROT 7.0 7.2 6.4*  ALBUMIN 2.7* 2.6* 2.4*   No results for input(s): "LIPASE", "AMYLASE" in the last 168 hours. No results for input(s): "AMMONIA" in the last 168 hours. CBC: Recent Labs  Lab 03/11/23 2300 03/12/23 0122 03/12/23 0248 03/13/23 1218 03/14/23 0301  WBC 14.8* 14.1*  --  19.9* 17.3*  NEUTROABS 13.0*  --   --   --  15.0*  HGB 14.2 14.5 14.3 15.2 13.8  HCT 43.5 43.5 42.0 45.9 41.5  MCV 87.2 86.7  --  87.1 86.5  PLT 349 356  --  445* 383   Cardiac Enzymes: No results for input(s): "CKTOTAL", "CKMB", "CKMBINDEX", "TROPONINI" in the last 168 hours. BNP: BNP (last 3 results) No results for input(s): "BNP" in the last 8760 hours.  ProBNP (last 3 results) No results for input(s): "PROBNP" in the last 8760 hours.  CBG: No results for input(s): "GLUCAP" in the last 168 hours.     Signed:  Lacretia Nicks MD.  [facility] 03/14/2023, 9:33 AM

## 2023-03-14 NOTE — Progress Notes (Signed)
Discharge instructions reviewed with pt.  Copy of instructions given to pt, pt informed his scripts were sent to his pharmacy for pick up.  Pt has called his brother and he will be coming in the next few minutes and will call to the pt when he arrives.  Pt to be d/c'd via wheelchair with belongings, with his brother and will be            escorted by hospital volunteer/staff.   Lera Gaines,RN SWOT

## 2023-03-14 NOTE — Consult Note (Addendum)
[facility] Liaison Consult Note    03/14/2023  KIERRE KONIGSBERG 06/24/1940 [id number]  Insurance:  Medicare ACO REACH   Primary Care Provider: Rodrigo Ran, MD, with [facility], this provider is listed for the transition of care follow up appointments.   RN Hospital Liaison screened the patient remotely at [facility]. Patient was discussed in unit progression meeting, no needs per inpatient [facility] RN as patient does not need [facility] Pharmacy for follow up. DC home with family. Plan:   Referral request for community care coordination: [facility] team only follows with Pharmacy only if needed. No needs.   [facility]I Community Care, Population Health does not replace or interfere with any arrangements made by the Inpatient Transition of Care team.   For questions contact:   Charlesetta Shanks, RN, BSN, CCM [place]  [facility], Population Health, [facility] Liaison Direct Dial: [phone] or secure chat Email: [email or web address]

## 2023-03-14 NOTE — Plan of Care (Signed)
  Problem: Education: Goal: Knowledge of General Education information will improve Description: Including pain rating scale, medication(s)/side effects and non-pharmacologic comfort measures Outcome: Completed/Met   Problem: Health Behavior/Discharge Planning: Goal: Ability to manage health-related needs will improve Outcome: Completed/Met   Problem: Clinical Measurements: Goal: Ability to maintain clinical measurements within normal limits will improve Outcome: Completed/Met Goal: Will remain free from infection Outcome: Completed/Met Goal: Diagnostic test results will improve Outcome: Completed/Met Goal: Respiratory complications will improve Outcome: Completed/Met Goal: Cardiovascular complication will be avoided Outcome: Completed/Met   Problem: Activity: Goal: Risk for activity intolerance will decrease Outcome: Completed/Met   Problem: Nutrition: Goal: Adequate nutrition will be maintained Outcome: Completed/Met   Problem: Coping: Goal: Level of anxiety will decrease Outcome: Completed/Met   Problem: Elimination: Goal: Will not experience complications related to bowel motility Outcome: Completed/Met Goal: Will not experience complications related to urinary retention Outcome: Completed/Met   Problem: Pain Management: Goal: General experience of comfort will improve Outcome: Completed/Met   Problem: Safety: Goal: Ability to remain free from injury will improve Outcome: Completed/Met   Problem: Skin Integrity: Goal: Risk for impaired skin integrity will decrease Outcome: Completed/Met   Problem: Education: Goal: Knowledge of disease or condition will improve Outcome: Completed/Met Goal: Knowledge of the prescribed therapeutic regimen will improve Outcome: Completed/Met Goal: Individualized Educational Video(s) Outcome: Completed/Met   Problem: Activity: Goal: Ability to tolerate increased activity will improve Outcome: Completed/Met Goal: Will  verbalize the importance of balancing activity with adequate rest periods Outcome: Completed/Met   Problem: Respiratory: Goal: Ability to maintain a clear airway will improve Outcome: Completed/Met Goal: Levels of oxygenation will improve Outcome: Completed/Met Goal: Ability to maintain adequate ventilation will improve Outcome: Completed/Met   Problem: Activity: Goal: Ability to tolerate increased activity will improve Outcome: Completed/Met   Problem: Clinical Measurements: Goal: Ability to maintain a body temperature in the normal range will improve Outcome: Completed/Met   Problem: Respiratory: Goal: Ability to maintain adequate ventilation will improve Outcome: Completed/Met Goal: Ability to maintain a clear airway will improve Outcome: Completed/Met

## 2023-03-14 NOTE — Progress Notes (Signed)
Mobility Specialist Progress Note:    03/14/23 0925  Mobility  Activity Ambulated with assistance in hallway  Level of Assistance Standby assist, set-up cues, supervision of patient - no hands on  Assistive Device None  Distance Ambulated (ft) 400 ft  Activity Response Tolerated well  Mobility Referral Yes  Mobility visit 1 Mobility  Mobility Specialist Start Time (ACUTE ONLY) 0910  Mobility Specialist Stop Time (ACUTE ONLY) 0925  Mobility Specialist Time Calculation (min) (ACUTE ONLY) 15 min   Received pt in bed having no complaints and agreeable to mobility. Pt was asymptomatic throughout session. Ambulated on RA throughout, no SOB ad SPOr remained between 90%-96%. Returned to room w/o fault. Left in bed w/ call bell in reach and all needs met.   Thompson Grayer Mobility Specialist  Please contact vis Secure Chat or  Rehab Office [phone]

## 2023-03-14 NOTE — Progress Notes (Signed)
Mobility Specialist Progress Note:  Nurse requested Mobility Specialist to perform oxygen saturation test with pt which includes removing pt from oxygen both at rest and while ambulating.  Below are the results from that testing.     Patient Saturations on Room Air at Rest = spO2 96%  Patient Saturations on Room Air while Ambulating = sp02 90% .    At end of testing pt left in room on 0  Liters of oxygen.  Reported results to nurse.    Thompson Grayer Mobility Specialist  Please contact vis Secure Chat or  Rehab Office [phone]

## 2023-03-17 DIAGNOSIS — R911 Solitary pulmonary nodule: Secondary | ICD-10-CM | POA: Diagnosis not present

## 2023-03-17 DIAGNOSIS — K449 Diaphragmatic hernia without obstruction or gangrene: Secondary | ICD-10-CM | POA: Diagnosis not present

## 2023-03-17 DIAGNOSIS — R599 Enlarged lymph nodes, unspecified: Secondary | ICD-10-CM | POA: Diagnosis not present

## 2023-03-17 DIAGNOSIS — J432 Centrilobular emphysema: Secondary | ICD-10-CM | POA: Diagnosis not present

## 2023-03-17 DIAGNOSIS — J449 Chronic obstructive pulmonary disease, unspecified: Secondary | ICD-10-CM | POA: Diagnosis not present

## 2023-03-17 LAB — CULTURE, BLOOD (ROUTINE X 2)
Culture: NO GROWTH
Culture: NO GROWTH
Special Requests: ADEQUATE

## 2023-03-21 DIAGNOSIS — H6123 Impacted cerumen, bilateral: Secondary | ICD-10-CM | POA: Diagnosis not present

## 2023-03-21 DIAGNOSIS — H903 Sensorineural hearing loss, bilateral: Secondary | ICD-10-CM | POA: Diagnosis not present

## 2023-03-23 ENCOUNTER — Telehealth (HOSPITAL_COMMUNITY): Payer: Self-pay | Admitting: Medical

## 2023-03-23 NOTE — Telephone Encounter (Cosign Needed)
Addenda made per coding request.

## 2023-03-27 DIAGNOSIS — E559 Vitamin D deficiency, unspecified: Secondary | ICD-10-CM | POA: Diagnosis not present

## 2023-03-27 DIAGNOSIS — E291 Testicular hypofunction: Secondary | ICD-10-CM | POA: Diagnosis not present

## 2023-03-27 DIAGNOSIS — I129 Hypertensive chronic kidney disease with stage 1 through stage 4 chronic kidney disease, or unspecified chronic kidney disease: Secondary | ICD-10-CM | POA: Diagnosis not present

## 2023-03-27 DIAGNOSIS — E1169 Type 2 diabetes mellitus with other specified complication: Secondary | ICD-10-CM | POA: Diagnosis not present

## 2023-03-27 DIAGNOSIS — N1831 Chronic kidney disease, stage 3a: Secondary | ICD-10-CM | POA: Diagnosis not present

## 2023-03-27 DIAGNOSIS — E611 Iron deficiency: Secondary | ICD-10-CM | POA: Diagnosis not present

## 2023-03-27 DIAGNOSIS — E785 Hyperlipidemia, unspecified: Secondary | ICD-10-CM | POA: Diagnosis not present

## 2023-03-27 DIAGNOSIS — D649 Anemia, unspecified: Secondary | ICD-10-CM | POA: Diagnosis not present

## 2023-03-27 DIAGNOSIS — E039 Hypothyroidism, unspecified: Secondary | ICD-10-CM | POA: Diagnosis not present

## 2023-03-27 DIAGNOSIS — Z125 Encounter for screening for malignant neoplasm of prostate: Secondary | ICD-10-CM | POA: Diagnosis not present

## 2023-04-02 DIAGNOSIS — E785 Hyperlipidemia, unspecified: Secondary | ICD-10-CM | POA: Diagnosis not present

## 2023-04-02 DIAGNOSIS — E1169 Type 2 diabetes mellitus with other specified complication: Secondary | ICD-10-CM | POA: Diagnosis not present

## 2023-04-02 DIAGNOSIS — J189 Pneumonia, unspecified organism: Secondary | ICD-10-CM | POA: Diagnosis not present

## 2023-04-02 DIAGNOSIS — N1831 Chronic kidney disease, stage 3a: Secondary | ICD-10-CM | POA: Diagnosis not present

## 2023-04-02 DIAGNOSIS — I2510 Atherosclerotic heart disease of native coronary artery without angina pectoris: Secondary | ICD-10-CM | POA: Diagnosis not present

## 2023-04-02 DIAGNOSIS — J8410 Pulmonary fibrosis, unspecified: Secondary | ICD-10-CM | POA: Diagnosis not present

## 2023-04-02 DIAGNOSIS — I129 Hypertensive chronic kidney disease with stage 1 through stage 4 chronic kidney disease, or unspecified chronic kidney disease: Secondary | ICD-10-CM | POA: Diagnosis not present

## 2023-04-02 DIAGNOSIS — E039 Hypothyroidism, unspecified: Secondary | ICD-10-CM | POA: Diagnosis not present

## 2023-04-02 DIAGNOSIS — D649 Anemia, unspecified: Secondary | ICD-10-CM | POA: Diagnosis not present

## 2023-04-02 DIAGNOSIS — Z0000 Encounter for general adult medical examination without abnormal findings: Secondary | ICD-10-CM | POA: Diagnosis not present

## 2023-04-02 DIAGNOSIS — J449 Chronic obstructive pulmonary disease, unspecified: Secondary | ICD-10-CM | POA: Diagnosis not present

## 2023-04-02 DIAGNOSIS — Z889 Allergy status to unspecified drugs, medicaments and biological substances status: Secondary | ICD-10-CM | POA: Diagnosis not present

## 2023-04-07 DIAGNOSIS — J189 Pneumonia, unspecified organism: Secondary | ICD-10-CM | POA: Diagnosis not present

## 2023-04-07 DIAGNOSIS — R051 Acute cough: Secondary | ICD-10-CM | POA: Diagnosis not present

## 2023-04-07 DIAGNOSIS — Z1152 Encounter for screening for COVID-19: Secondary | ICD-10-CM | POA: Diagnosis not present

## 2023-04-07 DIAGNOSIS — N1831 Chronic kidney disease, stage 3a: Secondary | ICD-10-CM | POA: Diagnosis not present

## 2023-04-07 DIAGNOSIS — I129 Hypertensive chronic kidney disease with stage 1 through stage 4 chronic kidney disease, or unspecified chronic kidney disease: Secondary | ICD-10-CM | POA: Diagnosis not present

## 2023-04-07 DIAGNOSIS — J8410 Pulmonary fibrosis, unspecified: Secondary | ICD-10-CM | POA: Diagnosis not present

## 2023-04-09 ENCOUNTER — Ambulatory Visit: Payer: Medicare Other

## 2023-04-30 DIAGNOSIS — H26493 Other secondary cataract, bilateral: Secondary | ICD-10-CM | POA: Diagnosis not present

## 2023-04-30 DIAGNOSIS — J8410 Pulmonary fibrosis, unspecified: Secondary | ICD-10-CM | POA: Diagnosis not present

## 2023-04-30 DIAGNOSIS — R599 Enlarged lymph nodes, unspecified: Secondary | ICD-10-CM | POA: Diagnosis not present

## 2023-04-30 DIAGNOSIS — R911 Solitary pulmonary nodule: Secondary | ICD-10-CM | POA: Diagnosis not present

## 2023-04-30 DIAGNOSIS — R590 Localized enlarged lymph nodes: Secondary | ICD-10-CM | POA: Diagnosis not present

## 2023-04-30 DIAGNOSIS — H401331 Pigmentary glaucoma, bilateral, mild stage: Secondary | ICD-10-CM | POA: Diagnosis not present

## 2023-04-30 DIAGNOSIS — R918 Other nonspecific abnormal finding of lung field: Secondary | ICD-10-CM | POA: Diagnosis not present

## 2023-05-05 DIAGNOSIS — J449 Chronic obstructive pulmonary disease, unspecified: Secondary | ICD-10-CM | POA: Diagnosis not present

## 2023-05-05 DIAGNOSIS — J309 Allergic rhinitis, unspecified: Secondary | ICD-10-CM | POA: Diagnosis not present

## 2023-05-05 DIAGNOSIS — G4733 Obstructive sleep apnea (adult) (pediatric): Secondary | ICD-10-CM | POA: Diagnosis not present

## 2023-05-05 DIAGNOSIS — R058 Other specified cough: Secondary | ICD-10-CM | POA: Diagnosis not present

## 2023-05-05 DIAGNOSIS — J189 Pneumonia, unspecified organism: Secondary | ICD-10-CM | POA: Diagnosis not present

## 2023-05-07 ENCOUNTER — Ambulatory Visit: Payer: Medicare Other

## 2023-05-07 VITALS — BP 146/73 | HR 76 | Temp 97.9°F | Resp 20 | Ht 70.0 in | Wt 171.8 lb

## 2023-05-07 DIAGNOSIS — M810 Age-related osteoporosis without current pathological fracture: Secondary | ICD-10-CM | POA: Diagnosis not present

## 2023-05-07 MED ORDER — DENOSUMAB 60 MG/ML ~~LOC~~ SOSY
60.0000 mg | PREFILLED_SYRINGE | Freq: Once | SUBCUTANEOUS | Status: AC
  Administered 2023-05-07: 60 mg via SUBCUTANEOUS
  Filled 2023-05-07: qty 1

## 2023-05-07 NOTE — Progress Notes (Signed)
Diagnosis: Osteoporosis  Provider:  Chilton Greathouse MD  Procedure: Injection  Prolia (Denosumab), Dose: 60 mg, Site: subcutaneous, Number of injections: 1  Injection Site(s): Left arm  Post Care: Patient declined observation  Discharge: Condition: Good, Destination: Home . AVS Provided  Performed by:  Wyvonne Lenz, RN

## 2023-06-12 DIAGNOSIS — J8410 Pulmonary fibrosis, unspecified: Secondary | ICD-10-CM | POA: Diagnosis not present

## 2023-06-12 DIAGNOSIS — J432 Centrilobular emphysema: Secondary | ICD-10-CM | POA: Diagnosis not present

## 2023-06-12 DIAGNOSIS — R591 Generalized enlarged lymph nodes: Secondary | ICD-10-CM | POA: Diagnosis not present

## 2023-06-12 DIAGNOSIS — R918 Other nonspecific abnormal finding of lung field: Secondary | ICD-10-CM | POA: Diagnosis not present

## 2023-06-17 DIAGNOSIS — K449 Diaphragmatic hernia without obstruction or gangrene: Secondary | ICD-10-CM | POA: Diagnosis not present

## 2023-06-17 DIAGNOSIS — J449 Chronic obstructive pulmonary disease, unspecified: Secondary | ICD-10-CM | POA: Diagnosis not present

## 2023-06-17 DIAGNOSIS — J432 Centrilobular emphysema: Secondary | ICD-10-CM | POA: Diagnosis not present

## 2023-07-22 DIAGNOSIS — R509 Fever, unspecified: Secondary | ICD-10-CM | POA: Diagnosis not present

## 2023-07-22 DIAGNOSIS — E039 Hypothyroidism, unspecified: Secondary | ICD-10-CM | POA: Diagnosis not present

## 2023-07-22 DIAGNOSIS — J849 Interstitial pulmonary disease, unspecified: Secondary | ICD-10-CM | POA: Diagnosis not present

## 2023-07-22 DIAGNOSIS — J449 Chronic obstructive pulmonary disease, unspecified: Secondary | ICD-10-CM | POA: Diagnosis not present

## 2023-07-22 DIAGNOSIS — E1169 Type 2 diabetes mellitus with other specified complication: Secondary | ICD-10-CM | POA: Diagnosis not present

## 2023-07-22 DIAGNOSIS — E291 Testicular hypofunction: Secondary | ICD-10-CM | POA: Diagnosis not present

## 2023-08-07 DIAGNOSIS — J449 Chronic obstructive pulmonary disease, unspecified: Secondary | ICD-10-CM | POA: Diagnosis not present

## 2023-08-07 DIAGNOSIS — J432 Centrilobular emphysema: Secondary | ICD-10-CM | POA: Diagnosis not present

## 2023-08-07 DIAGNOSIS — D72829 Elevated white blood cell count, unspecified: Secondary | ICD-10-CM | POA: Diagnosis not present

## 2023-10-17 NOTE — Progress Notes (Signed)
 [facility] [place] [facility] Aid Note   Patient Name: Douglas [name]   Patient DOB: May 04, 1940                Patient Age: 83 y.o.     Reason for Visit: Casimir dropped his left TH advanced 6 RIC off at the office with the concern that the receiver was broken.   Their managing audiologist is Laymon Quivers, Au.D., CCC-A.   Procedure: Replaced 2P receiver and medium power dome. Problem resolved and aid given back to Mr.Schmall.  Follow-up: PRN   Billing: $15.00(clean and check)

## 2023-10-28 DIAGNOSIS — H2511 Age-related nuclear cataract, right eye: Secondary | ICD-10-CM | POA: Diagnosis not present

## 2023-10-28 DIAGNOSIS — H401312 Pigmentary glaucoma, right eye, moderate stage: Secondary | ICD-10-CM | POA: Diagnosis not present

## 2023-11-05 ENCOUNTER — Ambulatory Visit: Payer: Medicare Other

## 2023-11-05 ENCOUNTER — Ambulatory Visit

## 2023-11-05 VITALS — BP 131/73 | HR 61 | Temp 97.5°F | Resp 18 | Ht 69.0 in | Wt 163.2 lb

## 2023-11-05 DIAGNOSIS — M810 Age-related osteoporosis without current pathological fracture: Secondary | ICD-10-CM | POA: Diagnosis not present

## 2023-11-05 MED ORDER — DENOSUMAB 60 MG/ML ~~LOC~~ SOSY
60.0000 mg | PREFILLED_SYRINGE | Freq: Once | SUBCUTANEOUS | Status: AC
Start: 2023-11-05 — End: 2023-11-05
  Administered 2023-11-05 (×2): 60 mg via SUBCUTANEOUS
  Filled 2023-11-05: qty 1

## 2023-11-05 NOTE — Progress Notes (Signed)
 Diagnosis: Osteoporosis  Provider:  Chilton Greathouse MD  Procedure: Injection  Prolia (Denosumab), Dose: 60 mg, Site: subcutaneous, Number of injections: 1  Injection Site(s): Right arm  Post Care: Patient declined observation  Discharge: Condition: Good, Destination: Home . AVS Provided  Performed by:  Adriana Mccallum, RN

## 2023-11-06 DIAGNOSIS — N1831 Chronic kidney disease, stage 3a: Secondary | ICD-10-CM | POA: Diagnosis not present

## 2023-11-06 DIAGNOSIS — J449 Chronic obstructive pulmonary disease, unspecified: Secondary | ICD-10-CM | POA: Diagnosis not present

## 2023-11-06 DIAGNOSIS — Z889 Allergy status to unspecified drugs, medicaments and biological substances status: Secondary | ICD-10-CM | POA: Diagnosis not present

## 2023-11-06 DIAGNOSIS — I2510 Atherosclerotic heart disease of native coronary artery without angina pectoris: Secondary | ICD-10-CM | POA: Diagnosis not present

## 2023-11-06 DIAGNOSIS — E039 Hypothyroidism, unspecified: Secondary | ICD-10-CM | POA: Diagnosis not present

## 2023-11-06 DIAGNOSIS — J189 Pneumonia, unspecified organism: Secondary | ICD-10-CM | POA: Diagnosis not present

## 2023-11-06 DIAGNOSIS — J849 Interstitial pulmonary disease, unspecified: Secondary | ICD-10-CM | POA: Diagnosis not present

## 2023-11-06 DIAGNOSIS — G4733 Obstructive sleep apnea (adult) (pediatric): Secondary | ICD-10-CM | POA: Diagnosis not present

## 2023-11-06 DIAGNOSIS — E1169 Type 2 diabetes mellitus with other specified complication: Secondary | ICD-10-CM | POA: Diagnosis not present

## 2023-11-06 DIAGNOSIS — M810 Age-related osteoporosis without current pathological fracture: Secondary | ICD-10-CM | POA: Diagnosis not present

## 2023-11-06 DIAGNOSIS — I129 Hypertensive chronic kidney disease with stage 1 through stage 4 chronic kidney disease, or unspecified chronic kidney disease: Secondary | ICD-10-CM | POA: Diagnosis not present

## 2023-11-06 DIAGNOSIS — R058 Other specified cough: Secondary | ICD-10-CM | POA: Diagnosis not present

## 2023-11-25 DIAGNOSIS — D649 Anemia, unspecified: Secondary | ICD-10-CM | POA: Diagnosis not present

## 2023-11-25 DIAGNOSIS — Z1389 Encounter for screening for other disorder: Secondary | ICD-10-CM | POA: Diagnosis not present

## 2023-11-25 DIAGNOSIS — E785 Hyperlipidemia, unspecified: Secondary | ICD-10-CM | POA: Diagnosis not present

## 2023-11-25 DIAGNOSIS — E1169 Type 2 diabetes mellitus with other specified complication: Secondary | ICD-10-CM | POA: Diagnosis not present

## 2023-11-25 DIAGNOSIS — I129 Hypertensive chronic kidney disease with stage 1 through stage 4 chronic kidney disease, or unspecified chronic kidney disease: Secondary | ICD-10-CM | POA: Diagnosis not present

## 2023-11-25 DIAGNOSIS — E039 Hypothyroidism, unspecified: Secondary | ICD-10-CM | POA: Diagnosis not present

## 2023-11-26 DIAGNOSIS — R5383 Other fatigue: Secondary | ICD-10-CM | POA: Diagnosis not present

## 2023-11-26 DIAGNOSIS — J8410 Pulmonary fibrosis, unspecified: Secondary | ICD-10-CM | POA: Diagnosis not present

## 2023-11-26 DIAGNOSIS — R053 Chronic cough: Secondary | ICD-10-CM | POA: Diagnosis not present

## 2023-11-26 DIAGNOSIS — D696 Thrombocytopenia, unspecified: Secondary | ICD-10-CM | POA: Diagnosis not present

## 2023-11-26 DIAGNOSIS — J988 Other specified respiratory disorders: Secondary | ICD-10-CM | POA: Diagnosis not present

## 2023-11-26 DIAGNOSIS — I129 Hypertensive chronic kidney disease with stage 1 through stage 4 chronic kidney disease, or unspecified chronic kidney disease: Secondary | ICD-10-CM | POA: Diagnosis not present

## 2023-11-26 DIAGNOSIS — R509 Fever, unspecified: Secondary | ICD-10-CM | POA: Diagnosis not present

## 2023-11-26 DIAGNOSIS — E1169 Type 2 diabetes mellitus with other specified complication: Secondary | ICD-10-CM | POA: Diagnosis not present

## 2023-11-26 DIAGNOSIS — Z1152 Encounter for screening for COVID-19: Secondary | ICD-10-CM | POA: Diagnosis not present

## 2023-11-26 DIAGNOSIS — J309 Allergic rhinitis, unspecified: Secondary | ICD-10-CM | POA: Diagnosis not present

## 2023-11-26 DIAGNOSIS — N1831 Chronic kidney disease, stage 3a: Secondary | ICD-10-CM | POA: Diagnosis not present

## 2023-11-26 DIAGNOSIS — J449 Chronic obstructive pulmonary disease, unspecified: Secondary | ICD-10-CM | POA: Diagnosis not present

## 2023-12-03 DIAGNOSIS — L570 Actinic keratosis: Secondary | ICD-10-CM | POA: Diagnosis not present

## 2023-12-03 DIAGNOSIS — L718 Other rosacea: Secondary | ICD-10-CM | POA: Diagnosis not present

## 2023-12-03 DIAGNOSIS — L821 Other seborrheic keratosis: Secondary | ICD-10-CM | POA: Diagnosis not present

## 2023-12-03 DIAGNOSIS — D1801 Hemangioma of skin and subcutaneous tissue: Secondary | ICD-10-CM | POA: Diagnosis not present

## 2023-12-03 DIAGNOSIS — L812 Freckles: Secondary | ICD-10-CM | POA: Diagnosis not present

## 2023-12-18 DIAGNOSIS — B09 Unspecified viral infection characterized by skin and mucous membrane lesions: Secondary | ICD-10-CM | POA: Diagnosis not present

## 2023-12-18 DIAGNOSIS — J988 Other specified respiratory disorders: Secondary | ICD-10-CM | POA: Diagnosis not present

## 2023-12-18 DIAGNOSIS — R21 Rash and other nonspecific skin eruption: Secondary | ICD-10-CM | POA: Diagnosis not present

## 2023-12-19 ENCOUNTER — Other Ambulatory Visit (HOSPITAL_COMMUNITY): Payer: Self-pay | Admitting: Internal Medicine

## 2023-12-29 DIAGNOSIS — M810 Age-related osteoporosis without current pathological fracture: Secondary | ICD-10-CM | POA: Diagnosis not present

## 2023-12-30 DIAGNOSIS — Z23 Encounter for immunization: Secondary | ICD-10-CM | POA: Diagnosis not present

## 2024-01-05 ENCOUNTER — Other Ambulatory Visit (HOSPITAL_COMMUNITY): Payer: Self-pay | Admitting: Nurse Practitioner

## 2024-01-05 DIAGNOSIS — J849 Interstitial pulmonary disease, unspecified: Secondary | ICD-10-CM | POA: Diagnosis not present

## 2024-01-05 DIAGNOSIS — R682 Dry mouth, unspecified: Secondary | ICD-10-CM | POA: Diagnosis not present

## 2024-01-05 DIAGNOSIS — R5383 Other fatigue: Secondary | ICD-10-CM | POA: Diagnosis not present

## 2024-01-05 DIAGNOSIS — R634 Abnormal weight loss: Secondary | ICD-10-CM | POA: Diagnosis not present

## 2024-01-05 DIAGNOSIS — R1032 Left lower quadrant pain: Secondary | ICD-10-CM

## 2024-01-05 DIAGNOSIS — N200 Calculus of kidney: Secondary | ICD-10-CM | POA: Diagnosis not present

## 2024-01-05 DIAGNOSIS — J8410 Pulmonary fibrosis, unspecified: Secondary | ICD-10-CM | POA: Diagnosis not present

## 2024-01-05 DIAGNOSIS — E559 Vitamin D deficiency, unspecified: Secondary | ICD-10-CM | POA: Diagnosis not present

## 2024-01-05 DIAGNOSIS — E039 Hypothyroidism, unspecified: Secondary | ICD-10-CM | POA: Diagnosis not present

## 2024-01-05 DIAGNOSIS — E291 Testicular hypofunction: Secondary | ICD-10-CM | POA: Diagnosis not present

## 2024-01-05 DIAGNOSIS — N390 Urinary tract infection, site not specified: Secondary | ICD-10-CM | POA: Diagnosis not present

## 2024-01-05 DIAGNOSIS — R319 Hematuria, unspecified: Secondary | ICD-10-CM | POA: Diagnosis not present

## 2024-01-05 DIAGNOSIS — I129 Hypertensive chronic kidney disease with stage 1 through stage 4 chronic kidney disease, or unspecified chronic kidney disease: Secondary | ICD-10-CM | POA: Diagnosis not present

## 2024-01-06 ENCOUNTER — Ambulatory Visit (HOSPITAL_COMMUNITY)
Admission: RE | Admit: 2024-01-06 | Discharge: 2024-01-06 | Disposition: A | Discharge status: Home / Self Care | Source: Ambulatory Visit | Attending: Nurse Practitioner | Admitting: Nurse Practitioner

## 2024-01-06 DIAGNOSIS — Z1389 Encounter for screening for other disorder: Secondary | ICD-10-CM | POA: Diagnosis not present

## 2024-01-06 DIAGNOSIS — R1032 Left lower quadrant pain: Secondary | ICD-10-CM | POA: Insufficient documentation

## 2024-01-06 DIAGNOSIS — R634 Abnormal weight loss: Secondary | ICD-10-CM | POA: Insufficient documentation

## 2024-01-06 DIAGNOSIS — R161 Splenomegaly, not elsewhere classified: Secondary | ICD-10-CM | POA: Diagnosis not present

## 2024-01-06 DIAGNOSIS — R591 Generalized enlarged lymph nodes: Secondary | ICD-10-CM | POA: Diagnosis not present

## 2024-01-06 DIAGNOSIS — N200 Calculus of kidney: Secondary | ICD-10-CM | POA: Diagnosis not present

## 2024-01-06 DIAGNOSIS — M810 Age-related osteoporosis without current pathological fracture: Secondary | ICD-10-CM | POA: Diagnosis not present

## 2024-01-06 DIAGNOSIS — Z1329 Encounter for screening for other suspected endocrine disorder: Secondary | ICD-10-CM | POA: Diagnosis not present

## 2024-01-06 DIAGNOSIS — K449 Diaphragmatic hernia without obstruction or gangrene: Secondary | ICD-10-CM | POA: Diagnosis not present

## 2024-01-08 ENCOUNTER — Other Ambulatory Visit (HOSPITAL_COMMUNITY): Payer: Self-pay | Admitting: Internal Medicine

## 2024-01-08 DIAGNOSIS — R590 Localized enlarged lymph nodes: Secondary | ICD-10-CM

## 2024-01-09 ENCOUNTER — Inpatient Hospital Stay: Attending: Physician Assistant | Admitting: Physician Assistant

## 2024-01-09 ENCOUNTER — Inpatient Hospital Stay

## 2024-01-09 VITALS — BP 140/75 | HR 90 | Temp 97.7°F | Resp 16 | Wt 152.3 lb

## 2024-01-09 DIAGNOSIS — Z125 Encounter for screening for malignant neoplasm of prostate: Secondary | ICD-10-CM | POA: Diagnosis not present

## 2024-01-09 DIAGNOSIS — R161 Splenomegaly, not elsewhere classified: Secondary | ICD-10-CM

## 2024-01-09 DIAGNOSIS — R599 Enlarged lymph nodes, unspecified: Secondary | ICD-10-CM

## 2024-01-09 DIAGNOSIS — C8318 Mantle cell lymphoma, lymph nodes of multiple sites: Secondary | ICD-10-CM | POA: Diagnosis not present

## 2024-01-09 DIAGNOSIS — J8410 Pulmonary fibrosis, unspecified: Secondary | ICD-10-CM | POA: Insufficient documentation

## 2024-01-09 DIAGNOSIS — R591 Generalized enlarged lymph nodes: Secondary | ICD-10-CM | POA: Diagnosis present

## 2024-01-09 DIAGNOSIS — Z87891 Personal history of nicotine dependence: Secondary | ICD-10-CM | POA: Insufficient documentation

## 2024-01-09 DIAGNOSIS — R634 Abnormal weight loss: Secondary | ICD-10-CM | POA: Diagnosis not present

## 2024-01-09 DIAGNOSIS — Z139 Encounter for screening, unspecified: Secondary | ICD-10-CM

## 2024-01-09 DIAGNOSIS — D693 Immune thrombocytopenic purpura: Secondary | ICD-10-CM | POA: Diagnosis not present

## 2024-01-09 DIAGNOSIS — R61 Generalized hyperhidrosis: Secondary | ICD-10-CM | POA: Insufficient documentation

## 2024-01-09 DIAGNOSIS — D47Z9 Other specified neoplasms of uncertain behavior of lymphoid, hematopoietic and related tissue: Secondary | ICD-10-CM | POA: Diagnosis not present

## 2024-01-09 LAB — CBC WITH DIFFERENTIAL (CANCER CENTER ONLY)
Abs Immature Granulocytes: 0.03 K/uL (ref 0.00–0.07)
Basophils Absolute: 0.2 K/uL — ABNORMAL HIGH (ref 0.0–0.1)
Basophils Relative: 2 %
Eosinophils Absolute: 0.2 K/uL (ref 0.0–0.5)
Eosinophils Relative: 3 %
HCT: 37.6 % — ABNORMAL LOW (ref 39.0–52.0)
Hemoglobin: 12.3 g/dL — ABNORMAL LOW (ref 13.0–17.0)
Immature Granulocytes: 0 %
Lymphocytes Relative: 23 %
Lymphs Abs: 1.8 K/uL (ref 0.7–4.0)
MCH: 26.5 pg (ref 26.0–34.0)
MCHC: 32.7 g/dL (ref 30.0–36.0)
MCV: 80.9 fL (ref 80.0–100.0)
Monocytes Absolute: 1.2 K/uL — ABNORMAL HIGH (ref 0.1–1.0)
Monocytes Relative: 14 %
Neutro Abs: 4.7 K/uL (ref 1.7–7.7)
Neutrophils Relative %: 58 %
Platelet Count: 97 K/uL — ABNORMAL LOW (ref 150–400)
RBC: 4.65 MIL/uL (ref 4.22–5.81)
RDW: 17.2 % — ABNORMAL HIGH (ref 11.5–15.5)
Smear Review: NORMAL
WBC Count: 8.2 K/uL (ref 4.0–10.5)
nRBC: 0 % (ref 0.0–0.2)

## 2024-01-09 LAB — CMP (CANCER CENTER ONLY)
ALT: 39 U/L (ref 0–44)
AST: 68 U/L — ABNORMAL HIGH (ref 15–41)
Albumin: 3 g/dL — ABNORMAL LOW (ref 3.5–5.0)
Alkaline Phosphatase: 116 U/L (ref 38–126)
Anion gap: 7 (ref 5–15)
BUN: 25 mg/dL — ABNORMAL HIGH (ref 8–23)
CO2: 23 mmol/L (ref 22–32)
Calcium: 9.5 mg/dL (ref 8.9–10.3)
Chloride: 102 mmol/L (ref 98–111)
Creatinine: 1.65 mg/dL — ABNORMAL HIGH (ref 0.61–1.24)
GFR, Estimated: 42 mL/min — ABNORMAL LOW (ref >60)
Glucose, Bld: 88 mg/dL (ref 70–99)
Potassium: 3.9 mmol/L (ref 3.5–5.1)
Sodium: 132 mmol/L — ABNORMAL LOW (ref 135–145)
Total Bilirubin: 0.5 mg/dL (ref 0.0–1.2)
Total Protein: 9.9 g/dL — ABNORMAL HIGH (ref 6.5–8.1)

## 2024-01-09 LAB — SEDIMENTATION RATE: Sed Rate: 118 mm/h — ABNORMAL HIGH (ref 0–16)

## 2024-01-09 LAB — HEPATITIS B SURFACE ANTIGEN: Hepatitis B Surface Ag: NONREACTIVE

## 2024-01-09 LAB — HEPATITIS C ANTIBODY: HCV Ab: NONREACTIVE

## 2024-01-09 LAB — C-REACTIVE PROTEIN: CRP: 8.7 mg/dL — ABNORMAL HIGH (ref <1.0)

## 2024-01-09 LAB — PSA: Prostatic Specific Antigen: 0.71 ng/mL (ref 0.00–4.00)

## 2024-01-09 LAB — LACTATE DEHYDROGENASE: LDH: 206 U/L — ABNORMAL HIGH (ref 98–192)

## 2024-01-09 LAB — HEPATITIS B SURFACE ANTIBODY,QUALITATIVE: Hep B S Ab: NONREACTIVE

## 2024-01-09 NOTE — Progress Notes (Unsigned)
 [facility] [phone]   [phone]  INITIAL CONSULTATION:  Patient Care Team: Shayne Anes, MD as PCP - General (Internal Medicine) Dann Candyce RAMAN, MD as PCP - Cardiology (Cardiology) Caro Selinda LABOR, RN as Oncology Nurse Navigator Leonetti, Forestine BROCKS, RN as Oncology Nurse Navigator (Medical Oncology)  CHIEF COMPLAINTS/PURPOSE OF CONSULTATION:  Lymphadenopathy and splenomegaly   HISTORY OF PRESENTING ILLNESS:  Douglas [name] 83 y.o. male with medical history significant for hiatal hernia, interstitial pulmonary fibrosis, obstructive sleep apnea, GERD, hypothyroidism, osteoporosis, hypertension, iron deficiency anemia, history of ITP.  On review of the previous records patient is being referred by primary care provider Warren Cones NP at [facility].  Patient had an office visit on 01/05/2024 for acute left lower abdominal pain, fatigue and weight loss.  Patient had CBC on 01/05/2024 showing absolute lymphocytes 1.3, WBC 8.4, platelets 86k. CT AP performed on 01/06/2024 showing new multistation lymphadenopathy involving the splenic hilum, upper abdomen retroperitoneum, external iliac chains and inguinal regions.  Also has new marked splenomegaly measuring up to 20.8 cm and new 8 mm and 18 mm hypodensities in the left hepatic lobe.  A PET scan has been scheduled for 01/14/2024.  Patient is also scheduled for surgical consult later today at [facility] surgery that was ordered by PCP.  Further chart review shows that patient was seen previously at the [facility] for acute ITP diagnosed in March 2022. Patient went into remission with high-dose steroid followed by taper and recommendation was for PCP monitoring.  On exam today patient is accompanied by his spouse who provides additional history.  Patient reports the abdominal pain that prompted his PCP visit on 1013/25 has since resolved.  It was  only present for approximately 2 days.  Patient reports that he has had unintentional weight loss over the last year estimating 45 pounds.  He has had fatigue for 6 months that has acutely worsened in the last 3 months.  Patient is also experiencing night sweats for the last month.  He does endorse appetite loss and dry mouth x 2 months.  He was treated for fevers recently with a course of doxycycline thought to be related to his underlying lung conditions.  He finished doxycycline 2 weeks ago and has not had fever since.  He denies any lymphadenopathy.  He states his pulmonary fibrosis is currently well-controlled.  He uses a Trelegy inhaler, he is not currently taking any oral steroids.  In regards to age-related screenings patient's last colonoscopy was in March 2023.  He is unsure when last PSA was checked however does not recall being told it has been abnormal in the past.  Patient is a previous smoker having quit 25 years ago.  He smoked a pack a day for 30 years.  Patient does not drink alcohol .  He is currently retired previously worked in Dietitian.  Patient denies any family history of malignancy.  MEDICAL HISTORY:  Past Medical History:  Diagnosis Date   Allergic rhinitis    Allergy     seasonal   Anal fissure    Anemia    Bilateral inguinal hernia    Chest pain    Chronic kidney disease    h/o- renal calculi- passed spont. , 10 episodes over the course of 20 yrs.    Colon polyps    GERD (gastroesophageal reflux disease)    Hyperlipidemia    Hypertension    Hypogonadism in male    Hypothyroidism  Kidney stones    Osteoporosis    Scrotal mass    Sebaceous cyst    Sleep apnea    CPAP q night - study done 8-10 yrs. ago    Testosterone  deficiency    injections q 3 week- July 8- last injection    Thyroid  disease    Vitamin D  deficiency     SURGICAL HISTORY: Past Surgical History:  Procedure Laterality Date   ANAL FISSURE REPAIR     CHOLECYSTECTOMY      COLONOSCOPY     FIBULA FRACTURE SURGERY     fibula and tibia fracture    FRACTURE SURGERY     L leg -plate & screws - ORIF- fibula   HEMORRHOID SURGERY     HERNIA REPAIR  04/19/2011   bilateral   LAPAROSCOPIC INGUINAL HERNIA REPAIR  04/19/2011   bilateral   LAPAROSCOPY  10/11/2011   Procedure: LAPAROSCOPY DIAGNOSTIC;  Surgeon: Elspeth KYM Schultze, MD;  Location: [facility] OR;  Service: General;  Laterality: N/A;  UMBILICAL EXPLORATION   MASS EXCISION  10/11/2011   Procedure: EXCISION MASS;  Surgeon: Elspeth KYM Schultze, MD;  Location: [facility] OR;  Service: General;  Laterality: N/A;  EXCISION OF UMBILICAL MASS   POLYPECTOMY     UPPER GASTROINTESTINAL ENDOSCOPY      SOCIAL HISTORY: Social History   Socioeconomic History   Marital status: Married    Spouse name: Not on file   Number of children: Not on file   Years of education: Not on file   Highest education level: Not on file  Occupational History   Occupation: retired  Tobacco Use   Smoking status: Former    Current packs/day: 0.00    Types: Cigarettes    Quit date: 06/05/1998    Years since quitting: 25.6   Smokeless tobacco: Never  Vaping Use   Vaping status: Never Used  Substance and Sexual Activity   Alcohol  use: No   Drug use: No   Sexual activity: Not Currently  Other Topics Concern   Not on file  Social History Narrative   Not on file   Social Drivers of Health   Financial Resource Strain: Not on file  Food Insecurity: No Food Insecurity (01/09/2024)   Hunger Vital Sign    Worried About Running Out of Food in the Last Year: Never true    Ran Out of Food in the Last Year: Never true  Transportation Needs: No Transportation Needs (01/09/2024)   PRAPARE - Administrator, Civil Service (Medical): No    Lack of Transportation (Non-Medical): No  Physical Activity: Sufficiently Active (09/10/2021)   Exercise Vital Sign    Days of Exercise per Week: 3 days    Minutes of Exercise per Session: 60 min  Stress: Not on  file  Social Connections: Not on file  Intimate Partner Violence: Not At Risk (03/12/2023)   Humiliation, Afraid, Rape, and Kick questionnaire    Fear of Current or Ex-Partner: No    Emotionally Abused: No    Physically Abused: No    Sexually Abused: No    FAMILY HISTORY: Family History  Problem Relation Age of Onset   Stroke Mother    Stroke Father    Colon cancer Neg Hx    Rectal cancer Neg Hx    Stomach cancer Neg Hx    Esophageal cancer Neg Hx    Colon polyps Neg Hx    Allergic rhinitis Neg Hx    Asthma Neg Hx  Angioedema Neg Hx    Atopy Neg Hx    Eczema Neg Hx    Immunodeficiency Neg Hx    Urticaria Neg Hx     ALLERGIES:  is allergic to alpha-gal, bovine (beef) protein-containing drug products, other, and porcine (pork) protein-containing drug products.  MEDICATIONS:  Current Outpatient Medications  Medication Sig Dispense Refill   albuterol  (VENTOLIN  HFA) 108 (90 Base) MCG/ACT inhaler Inhale 2 puffs into the lungs every 6 (six) hours as needed.     amLODipine  (NORVASC ) 5 MG tablet Take 5 mg by mouth at bedtime.     cetirizine (ZYRTEC) 5 MG tablet Take 10 mg by mouth at bedtime.     Cholecalciferol  100 MCG (4000 UT) CAPS Take 4,000 Units by mouth daily.     denosumab  (PROLIA ) 60 MG/ML SOSY injection      Dorzolamide  HCl-Timolol  Mal PF 2-0.5 % SOLN Apply 2 drops to eye daily.     EPINEPHrine  0.3 mg/0.3 mL IJ SOAJ injection Inject 0.3 mg into the muscle as needed for anaphylaxis.     fish oil-omega-3 fatty acids  1000 MG capsule Take 2 g by mouth daily.     levothyroxine  (SYNTHROID ) 125 MCG tablet Take 125 mcg by mouth every morning.     Magnesium 500 MG CAPS qd Oral     Multiple Vitamin (MULTIVITAMIN PO) Take by mouth daily.       pantoprazole  (PROTONIX ) 40 MG tablet Take 40 mg by mouth daily.     simvastatin  (ZOCOR ) 40 MG tablet Take 40 mg by mouth at bedtime.     telmisartan (MICARDIS) 80 MG tablet Take 80 mg by mouth at bedtime.     testosterone  cypionate  (DEPOTESTOTERONE CYPIONATE) 100 MG/ML injection Inject into the muscle every 21 ( twenty-one) days. For IM use only     TRELEGY ELLIPTA 100-62.5-25 MCG/ACT AEPB Inhale 1 puff into the lungs daily.     No current facility-administered medications for this visit.    REVIEW OF SYSTEMS:   All other systems are reviewed and are negative for acute change except as noted in the HPI.  PHYSICAL EXAMINATION: ECOG PERFORMANCE STATUS: 1 - Symptomatic but completely ambulatory  Vitals:   01/09/24 1200  BP: (!) 140/75  Pulse: 90  Resp: 16  Temp: 97.7 F (36.5 C)  SpO2: 95%   Filed Weights   01/09/24 1200  Weight: 152 lb 5 oz (69.1 kg)    Physical Exam Vitals reviewed.  Constitutional:      Appearance: He is not ill-appearing or toxic-appearing.  HENT:     Head: Normocephalic.     Right Ear: External ear normal.     Left Ear: External ear normal.     Nose: Nose normal.  Eyes:     General: No scleral icterus.    Conjunctiva/sclera: Conjunctivae normal.  Cardiovascular:     Rate and Rhythm: Normal rate and regular rhythm.     Pulses: Normal pulses.     Heart sounds: Normal heart sounds.  Pulmonary:     Effort: Pulmonary effort is normal.     Breath sounds: Normal breath sounds.  Abdominal:     General: There is no distension.     Palpations: Abdomen is soft. There is splenomegaly.     Tenderness: There is no abdominal tenderness. There is no guarding.  Musculoskeletal:        General: Normal range of motion.     Cervical back: Normal range of motion.  Lymphadenopathy:     Cervical:  No cervical adenopathy.     Upper Body:     Right upper body: No axillary adenopathy.     Left upper body: No axillary adenopathy.     Lower Body: No right inguinal adenopathy. No left inguinal adenopathy.  Skin:    General: Skin is warm and dry.       LABORATORY DATA:  I have reviewed the data as listed    Latest Ref Rng & Units 03/14/2023    3:01 AM 03/13/2023   12:18 PM 03/12/2023     2:48 AM  CBC  WBC 4.0 - 10.5 K/uL 17.3  19.9    Hemoglobin 13.0 - 17.0 g/dL 86.1  84.7  85.6   Hematocrit 39.0 - 52.0 % 41.5  45.9  42.0   Platelets 150 - 400 K/uL 383  445         Latest Ref Rng & Units 03/14/2023    3:01 AM 03/13/2023   12:18 PM 03/12/2023    2:48 AM  CMP  Glucose 70 - 99 mg/dL 853  851    BUN 8 - 23 mg/dL 27  26    Creatinine 9.38 - 1.24 mg/dL 8.67  8.68    Sodium 864 - 145 mmol/L 136  136  137   Potassium 3.5 - 5.1 mmol/L 4.1  4.0  3.9   Chloride 98 - 111 mmol/L 107  104    CO2 22 - 32 mmol/L 23  22    Calcium 8.9 - 10.3 mg/dL 8.6  9.2    Total Protein 6.5 - 8.1 g/dL 6.4     Total Bilirubin <1.2 mg/dL 0.4     Alkaline Phos 38 - 126 U/L 52     AST 15 - 41 U/L 21     ALT 0 - 44 U/L 22        RADIOGRAPHIC STUDIES: I have personally reviewed the radiological images as listed and agreed with the findings in the report. CT ABDOMEN PELVIS WO CONTRAST Result Date: 01/06/2024 EXAM: CT ABDOMEN AND PELVIS WITHOUT CONTRAST 01/06/2024 06:12:00 PM TECHNIQUE: CT of the abdomen and pelvis was performed without the administration of intravenous contrast. Multiplanar reformatted images are provided for review. Automated exposure control, iterative reconstruction, and/or weight-based adjustment of the mA/kV was utilized to reduce the radiation dose to as low as reasonably achievable. COMPARISON: CT abdomen and pelvis 01/31/2019. CLINICAL HISTORY: Left lower quadrant abdominal pain, Unintended weight loss. FINDINGS: LOWER CHEST: Multifocal ground-glass opacities throughout the bilateral lung bases, right greater than left. Emphysematous changes are seen in the right lung base. LIVER: There is an 8 mm hypodensity in the left lobe of the liver and an 18 mm hypodensity in the left lobe of the liver. These are new from prior. GALLBLADDER AND BILE DUCTS: Gallbladder is surgically absent. No biliary ductal dilatation. SPLEEN: Spleen is markedly enlarged measuring up to 20.8 cm in  length. PANCREAS: No acute abnormality. ADRENAL GLANDS: No acute abnormality. KIDNEYS, URETERS AND BLADDER: Punctate bilateral nonobstructing renal calculi. No hydronephrosis. Mild nonspecific bilateral perinephric fat stranding. Bladder is distended. There is mild diffuse bladder wall thickening. Calcifications are seen in the dependent portion of the bladder. GI AND BOWEL: Moderate-sized hiatal hernia. Stomach demonstrates no acute abnormality. The cecum is high riding. The appendix appears within normal limits. There is no bowel obstruction. PERITONEUM AND RETROPERITONEUM: No ascites. No free air. VASCULATURE: Aorta is normal in caliber. Atherosclerotic calcifications of the aorta. LYMPH NODES: There are numerous new retroperitoneal and upper abdominal  enlarged lymph nodes. Enlarged cortical lymph node measuring 3.4 x 2.2 cm. Peritoneal lymph nodes have increased in size near the splenic hilum. Numerous nonenlarged and mildly enlarged mesenteric lymph nodes measuring up to 12 mm. Enlarged retroperitoneal lymph nodes diffusely measuring up to 12 mm. Enlarged left external iliac lymph nodes measured up to 15 mm. Enlarged right external iliac lymph nodes measuring up to 12 mm. Prominent bilateral inguinal lymph nodes. Enlarged gastropancreatic lymph node measuring 11 mm. REPRODUCTIVE ORGANS: Prostate gland is enlarged. BONES AND SOFT TISSUES: No acute osseous abnormality. No focal soft tissue abnormality. IMPRESSION: 1. New multistation lymphadenopathy involving the splenic hilum, upper abdomen retroperitoneum, external iliac chains, and inguinal regions. Findings are concerning for lymphoma or metastatic disease. 2. New Marked splenomegaly measuring up to 20.8 cm. 3. New 8 mm and 18 mm hypodensities in the left hepatic lobe. 4. Moderate hiatal hernia. Electronically signed by: Greig Pique MD 01/06/2024 06:26 PM EDT RP Workstation: [id number]    ASSESSMENT & PLAN Douglas [name] is a 83 y.o. male presenting to  [facility] for consultation regarding lymphadenopathy and splenomegaly. Patient will proceed with laboratory workup today.   #Lymphadenopathy and splenomegaly - Reviewed CT abdomen pelvis findings with patient and spouse. - On exam today patient is well-appearing.  Abdomen is nontender, to appreciate splenomegaly on exam. - Will initiate workup today with CBC, CMP, inflammatory markers, LDH, flow cytometry, hepatitis B and C serologies. Will also check multiple myeloma panel with history of ITP and and JAK2 V617 BCR-ABL given previous neutrophilia/monocytosis to r/o MPN (though that would not explain LNadenopathy. -flow cytometry to evaluation for lymphoproliferative disorder. - Patient is already scheduled for a PET CT on 01/14/24 that was ordered by PCP. I will follow up on image results. -he has been referred to general surgery for consideration of LN biopsy -Dr Shayne has already order PET/CT that is scheduled for 10/22 which will help evaluate for a primary lesion and also determine distribution of LNadenopathy, best site for Biopsy and evaluate functional activity of his significant splenomegaly.  #Age related screenings - Will check PSA today.  -Patient will RTC when work up is complete.  Patient expressed understanding of the recommended workup and is agreeable to move forward.   All questions were answered. The patient knows to call the clinic with any problems, questions or concerns.  Shared visit with Dr. Onesimo.  Orders Placed This Encounter  Procedures   CBC with Differential (Cancer Center Only)    Release to patient:   Manual release only [2]    Reason for preventing immediate release:   Patient request [1]    Additional details for preventing immediate release:   pt request   CMP (Cancer Center only)    Release to patient:   Manual release only [2]    Reason for preventing immediate release:   Patient request [1]    Additional details for preventing  immediate release:   pt request   C-reactive protein    Release to patient:   Manual release only [2]    Reason for preventing immediate release:   Patient request [1]    Additional details for preventing immediate release:   pt request   Flow Cytometry, Peripheral Blood (Oncology)    Release to patient:   Manual release only [2]    Reason for preventing immediate release:   Patient request [1]    Additional details for preventing immediate release:   pt request   Hepatitis B core antibody,  total    Release to patient:   Manual release only [2]    Reason for preventing immediate release:   Patient request [1]    Additional details for preventing immediate release:   pt request   Hepatitis B surface antibody,qualitative    Release to patient:   Manual release only [2]    Reason for preventing immediate release:   Patient request [1]    Additional details for preventing immediate release:   pt request   Hepatitis B surface antigen    Release to patient:   Manual release only [2]    Reason for preventing immediate release:   Patient request [1]    Additional details for preventing immediate release:   pt request   Hepatitis C antibody    Release to patient:   Manual release only [2]    Reason for preventing immediate release:   Patient request [1]    Additional details for preventing immediate release:   pt request   Lactate dehydrogenase    Release to patient:   Manual release only [2]    Reason for preventing immediate release:   Patient request [1]    Additional details for preventing immediate release:   pt request   Sedimentation rate    Release to patient:   Manual release only [2]    Reason for preventing immediate release:   Patient request [1]    Additional details for preventing immediate release:   pt request   Kappa/lambda light chains    Release to patient:   Manual release only [2]    Reason for preventing immediate release:   Patient request [1]    Additional details  for preventing immediate release:   pt request   Multiple Myeloma Panel (SPEP&IFE w/QIG)    Release to patient:   Manual release only [2]    Reason for preventing immediate release:   Patient request [1]    Additional details for preventing immediate release:   pt request   JAK2 V617 reflex CALR/MPL/E12-15    Standing Status:   Future    Number of Occurrences:   1    Expected Date:   01/09/2024    Expiration Date:   01/08/2025    Release to patient:   Manual release only [2]    Reason for preventing immediate release:   Patient request [1]    Additional details for preventing immediate release:   pt request   BCR-ABL1 FISH    Standing Status:   Future    Number of Occurrences:   1    Expected Date:   01/09/2024    Expiration Date:   01/08/2025    Release to patient:   Manual release only [2]    Reason for preventing immediate release:   Patient request [1]    Additional details for preventing immediate release:   pt request   PSA (For [facility])    Standing Status:   Future    Number of Occurrences:   1    Expected Date:   01/09/2024    Expiration Date:   01/08/2025    Release to patient:   Manual release only [2]    Reason for preventing immediate release:   Patient request [1]    Additional details for preventing immediate release:   pt request      I have spent a total of 60 minutes minutes of face-to-face and non-face-to-face time, preparing to see the patient, obtaining and/or reviewing separately  obtained history, performing a medically appropriate examination, counseling and educating the patient, ordering medications/tests/procedures, referring and communicating with other health care professionals, documenting clinical information in the electronic health record, independently interpreting results and communicating results to the patient, and care coordination.   Kerney Hopfensperger Walisiewicz PA-C Department of Hematology/Oncology [facility] Phone: [phone]   ADDENDUM  .Patient was Personally and independently interviewed, examined and relevant elements of the history of present illness were reviewed in details and an assessment and plan was created. All elements of the patient's history of present illness , assessment and plan were discussed in details with Summerlynn Glauser Walisiewicz PA-C. The above documentation reflects our combined findings assessment and plan.   Gautam Kale MD MS

## 2024-01-09 NOTE — Progress Notes (Signed)
 [facility]  Patient presented to clinic, with his wife, for his scheduled appointment with Mallie Combes PA-C. I introduced myself and provided them with my direct contact information. Patient and his wife were bth encouraged to call with any questions/ concerns they have.   Selinda An, RN Oncology Nurse Navigator, [facility]  01/09/2024 1:35 PM

## 2024-01-09 NOTE — Patient Instructions (Signed)
 [facility] Office Visit Discharge Information and Instructions  Thank you for choosing [facility] for your healthcare needs.  Below is a summary of today's discussion, along with our contact information and an outline of what to expect next.  Reason for Visit:  enlarged lymph nodes and spleen  Proposed Diagnostic Care Plan: Labs collected today Keep PET scan as scheduled Keep appointment with surgeon for a consult   What to Expect: - Generally, when lab tests are ordered the results can take up to 1 week for results to be available.  At that point, we will contact you to discuss your results with you.  Unless there is a critical result, we will typically wait for all of your lab results to be available before contacting you. - If a biopsy is part of your Care Plan, those results can take on average 7-10 days to result.  Once results are available, we will contact you to discuss your pathology results and any next steps. - If you have additional imaging ordered, such as a CT Scan, MRI, Ultrasound, Bone Scan, or PET scan, your imaging will need to be authorized then scheduled with the earliest available appointment.  You may be asked to travel to another hospital within [facility] who has a sooner availability, please consider doing so if asked. - If you use MyChart, your results will be available to you in the MyChart portal.  Your provider will be in touch with you as soon as all of your results are available to be discussed.  Your Diagnostic Clinic Provider:  Izabela Ow Walisiewicz PA-C and Dr. Onesimo Your Diagnostic Navigator:  Colene Raider RN, office number [phone]  If you or your caregiver have number blocking on your cell phones, please ensure the cancer center's numbers are not blocked.  If you are not a registered MyChart user, please consider enrolling in MyChart to receive your test results and visit notes.  You can also access your discharge instructions  electronically.  MyChart also gives you an electronic means to communicate with your Care Team instead of needing to call in to the cancer center.  We appreciate you trusting us  with your healthcare and look forward to partnering with you as we work to uncover what your potential diagnosis may be.  Please do not hesitate to reach out at any point with questions or concerns.

## 2024-01-09 NOTE — Progress Notes (Signed)
 REFERRING PHYSICIAN:  Shayne Oneil LABOR, MD PROVIDER:  DEWARD PURCHASE STECHSCHULTE, MD MRN: [id number] DOB: 13-Sep-1940 DATE OF ENCOUNTER: 01/09/2024 Subjective    Chief Complaint: New Consultation   History of Present Illness: Douglas [name] is a 83 y.o. male who is seen today as an office consultation for evaluation of New Consultation    History of Present Illness Douglas [name] is an 83 year old male who presents with enlarged lymph nodes for further evaluation. He is accompanied by his wife. He was referred by the cancer center for evaluation of enlarged lymph nodes.  A recent CT scan showed lymph nodes in the groin area. He does not feel the lymph nodes himself and has no associated symptoms such as pain or discomfort.  A PET scan is scheduled to assess the lymph nodes' metabolic activity. The results will guide the decision on whether a needle biopsy or surgical excision is necessary.  He has a busy schedule with multiple medical appointments and has been at medical appointments all day without having had lunch.     Review of Systems: A complete review of systems was obtained from the patient.  I have reviewed this information and discussed as appropriate with the patient.  See HPI as well for other ROS.  ROS   Medical History: Past Medical History:  Diagnosis Date  . Anemia   . COPD (chronic obstructive pulmonary disease) (CMS/HHS-HCC)   . GERD (gastroesophageal reflux disease)   . Hypertension   . Sleep apnea   . Thyroid  disease     There is no problem list on file for this patient.   Past Surgical History:  Procedure Laterality Date  .  UMBILICAL EXPLORATION (Abdomen) EXCISION OF UMBILICAL MASS (Abdomen)  10/11/2011   Dr Sheldon  . anal fissure repair    . CHOLECYSTECTOMY    . HEMORRHOID SURGERY       Allergies  Allergen Reactions  . Alpha-Gal (Galactose-Alpha-1,3-Galactose) Anaphylaxis, Cough, Other (See Comments) and Shortness Of Breath    Anaphylaxis  (ALLERGY ) (Anaphylaxis), Cough (ALLERGY /intolerance), Respiratory Distress (ALLERGY /intolerance), Shortness Of Breath (ALLERGY /intolerance) (Shortness Of Breath), Wheezing (ALLERGY /intolerance)  . Other Other (See Comments)    Current Outpatient Medications on File Prior to Visit  Medication Sig Dispense Refill  . albuterol  MDI, PROVENTIL , VENTOLIN , PROAIR , HFA 90 mcg/actuation inhaler Inhale 2 inhalations into the lungs every 6 (six) hours as needed    . amLODIPine  (NORVASC ) 5 MG tablet Take 5 mg by mouth at bedtime    . cetirizine (ZYRTEC) 5 MG tablet Take 10 mg by mouth at bedtime    . cholecalciferol  (VITAMIN D3) 1000 unit tablet Take 1,000 Units by mouth    . denosumab  (PROLIA ) 60 mg/mL inj syringe     . dorzolamide -timoloL  (COSOPT ) 22.3-6.8 mg/mL ophthalmic solution PLACE 1 DROP IN RIGHT EYE TWICE DAILY AS DIRECTED.    . EPINEPHrine  (EPIPEN ) 0.3 mg/0.3 mL auto-injector Inject 0.3 mg into the muscle as needed for Anaphylaxis    . levothyroxine  (SYNTHROID ) 125 MCG tablet as directed 1 and 1/2 pill one day each week (like each sunday) and one pill 6 days of each week; Duration: 90 days    . omega-3 acid ethyl esters (LOVAZA) 1 gram capsule Take by mouth    . pantoprazole  (PROTONIX ) 40 MG DR tablet Take 40 mg by mouth once daily    . simvastatin  (ZOCOR ) 40 MG tablet Take 40 mg by mouth at bedtime    . telmisartan (MICARDIS) 80 MG tablet Take 80 mg  by mouth at bedtime    . testosterone  cypionate (DEPO-TESTOSTERONE ) 200 mg/mL injection INJECT 1.5ML (300MG ) INTRAMUSCULARLY EVERY 3 WEEKS AS DIRECTED.Dx-E29.1 Intramuscular    . TRELEGY ELLIPTA 100-62.5-25 mcg inhaler Inhale 1 Puff into the lungs once daily     No current facility-administered medications on file prior to visit.    Family History  Problem Relation Age of Onset  . High blood pressure (Hypertension) Mother   . Diabetes Mother   . Stroke Mother   . Stroke Father   . Diabetes Sister      Social History   Tobacco Use   Smoking Status Former  . Types: Cigarettes  Smokeless Tobacco Never     Social History   Socioeconomic History  . Marital status: Married  Tobacco Use  . Smoking status: Former    Types: Cigarettes  . Smokeless tobacco: Never  Substance and Sexual Activity  . Drug use: Never   Social Drivers of Health   Food Insecurity: No Food Insecurity (01/09/2024)   Received from [facility]   Hunger Vital Sign   . Within the past 12 months, you worried that your food would run out before you got the money to buy more.: Never true   . Within the past 12 months, the food you bought just didn't last and you didn't have money to get more.: Never true  Transportation Needs: No Transportation Needs (01/09/2024)   Received from [facility] - Transportation   . In the past 12 months, has lack of transportation kept you from medical appointments or from getting medications?: No   . In the past 12 months, has lack of transportation kept you from meetings, work, or from getting things needed for daily living?: No  Physical Activity: Sufficiently Active (09/10/2021)   Received from [facility]   Exercise Vital Sign   . On average, how many days per week do you engage in moderate to strenuous exercise (like a brisk walk)?: 3 days   . On average, how many minutes do you engage in exercise at this level?: 60 min  Housing Stability: Unknown (01/09/2024)   Housing Stability Vital Sign   . Homeless in the Last Year: No    Objective:   Vitals:   01/09/24 1526  BP: 131/79  Pulse: 94  Temp: 36.8 C (98.2 F)  SpO2: 97%  Weight: 69.1 kg (152 lb 6.4 oz)  Height: 175.3 cm (5' 9)  PainSc: 0-No pain    Body mass index is 22.51 kg/m.  Physical Exam   General appearance - Consistent with stated age. Normal posture. Voice Normal. Mental status - alert and oriented Integumentary - No rash or lesion on limited exam Head - Normocephalic, atraumatic Face - Strength and tone intact Eyes -  extraocular movement intact, sclera anicteric Chest - quiet, even and easy respiratory effort with no use of accessory muscles Neurological - able to articulate well with normal speech/language, rate, volume and coherence. Mood/affect - normal Judgement and insight -  insight is appropriate concerning matters relevant to self and the patient displays appropriate judgment regarding every day activities. Thought Processes/Cognitive Function - aware of current events. Musculoskeletal - strength symmetrical throughout, no deformity Abdomen - Soft, nontender  Lymph nodes - easily palpable bilateral inguinal lymphadenopathy, palpable lymph nodes in the axilla but unsure if enlarged.  Physical Exam GENITOURINARY: Enlarged lymph nodes palpated in the right groin.  Labs, Imaging and Diagnostic Testing: CT abd/pel 01/06/24 IMPRESSION: 1. New multistation lymphadenopathy involving  the splenic hilum, upper abdomen retroperitoneum, external iliac chains, and inguinal regions. Findings are concerning for lymphoma or metastatic disease. 2. New Marked splenomegaly measuring up to 20.8 cm. 3. New 8 mm and 18 mm hypodensities in the left hepatic lobe. 4. Moderate hiatal hernia.   Assessment and Plan:     Diagnoses and all orders for this visit:  Lymphadenopathy     Assessment & Plan Enlarged right inguinal lymph node with planned excisional biopsy Enlarged lymph nodes in the right inguinal region. Excisional biopsy preferred for comprehensive histopathological evaluation. Discussed potential complications of excisional biopsy, including chyloma, hematoma, and infection, though these are rare. Surgery is straightforward with anticipated quick recovery, allowing for timely initiation of further treatment if necessary. If chemotherapy is required, port placement may be considered during the biopsy procedure. - Perform PET scan on Wednesday to assess metabolic activity of lymph nodes. - Schedule right  inguinal lymph node excisional biopsy for next Friday. - Coordinate with oncologist regarding potential need for port placement during biopsy. - Perform procedure at [facility] as outpatient surgery.     PAUL JEFFREY STECHSCHULTE, MD    This note has been created using automated tools and reviewed for accuracy by PAUL JEFFREY STECHSCHULTE.

## 2024-01-10 LAB — HEPATITIS B CORE ANTIBODY, TOTAL: HEP B CORE AB: NEGATIVE

## 2024-01-12 ENCOUNTER — Encounter: Payer: Self-pay | Admitting: Internal Medicine

## 2024-01-12 LAB — KAPPA/LAMBDA LIGHT CHAINS
Kappa free light chain: 436.2 mg/L — ABNORMAL HIGH (ref 3.3–19.4)
Kappa, lambda light chain ratio: 2 — ABNORMAL HIGH (ref 0.26–1.65)
Lambda free light chains: 218.5 mg/L — ABNORMAL HIGH (ref 5.7–26.3)

## 2024-01-13 ENCOUNTER — Other Ambulatory Visit (HOSPITAL_COMMUNITY): Payer: Self-pay | Admitting: Internal Medicine

## 2024-01-13 DIAGNOSIS — R590 Localized enlarged lymph nodes: Secondary | ICD-10-CM

## 2024-01-13 NOTE — Progress Notes (Addendum)
 Anesthesia Review:  PCP: Perini  Cardiologist : Varanasi  LOV 02/28/2022  Pulm- maier- LOV 08/07/23   PPM/ ICD: Device Orders: Rep Notified:  Chest x-ray : 03/11/23- 1 view  EKG : 03/14/2023  CT cors- 2023  Echo : Stress test: Cardiac Cath :   Activity level:  Sleep Study/ CPAP : Fasting Blood Sugar :      / Checks Blood Sugar -- times a day:    Blood Thinner/ Instructions /Last Dose: ASA / Instructions/ Last Dose :    01/09/2024- CBC and CMp completed on 01/09/2024  01/14/24- PET Scan

## 2024-01-13 NOTE — Progress Notes (Unsigned)
 [name], MD  Douglas Rosella L Please resubmit biopsy request for review once the PET scan is read.  Thanks,  GY       Previous Messages    ----- Message ----- From: Douglas [name] Sent: 01/13/2024  10:45 AM EDT To: Rosella [name] Douglas; Taryn F Rigney, RT; Ir Proc* Subject: US  CORE BIOPSY (LYMPH NODES)                  Procedure :US  CORE BIOPSY (LYMPH NODES)  Reason :Lymphadenopathy,abdominal Dx: Lymphadenopathy, abdominal [R59.0 (ICD-10-CM)]    History :CT ABDOMEN PELVIS WO CONTRAST,  Patient is schedule for a NM PET Image Initial (PI) Skull Base To Thigh (F-18 FDG)  tomorrow at  430 pm.  Provider: Shayne Anes, MD  Provider contact ; [phone]

## 2024-01-13 NOTE — Patient Instructions (Addendum)
 SURGICAL WAITING ROOM VISITATION  Patients having surgery or a procedure may have no more than 2 support people in the waiting area - these visitors may rotate.    Children under the age of 23 must have an adult with them who is not the patient.  Visitors with respiratory illnesses are discouraged from visiting and should remain at home.  If the patient needs to stay at the hospital during part of their recovery, the visitor guidelines for inpatient rooms apply. Pre-op nurse will coordinate an appropriate time for 1 support person to accompany patient in pre-op.  This support person may not rotate.    Please refer to the [facility] website for the visitor guidelines for Inpatients (after your surgery is over and you are in a regular room).       Your procedure is scheduled on: 01/16/2024    Report to [facility] Main Entrance    Report to admitting at   0730AM   Call this number if you have problems the morning of surgery [phone]   Do not eat food :After Midnight.   After Midnight you may have the following liquids until _0630_____ AM DAY OF SURGERY  Water Non-Citrus Juices (without pulp, NO RED-Apple, White grape, White cranberry) Black Coffee (NO MILK/CREAM OR CREAMERS, sugar ok)  Clear Tea (NO MILK/CREAM OR CREAMERS, sugar ok) regular and decaf                             Plain Jell-O (NO RED)                                           Fruit ices (not with fruit pulp, NO RED)                                     Popsicles (NO RED)                                                               Sports drinks like Gatorade (NO RED)                            If you have questions, please contact your surgeon's office.      Oral Hygiene is also important to reduce your risk of infection.                                    Remember - BRUSH YOUR TEETH THE MORNING OF SURGERY WITH YOUR REGULAR TOOTHPASTE  DENTURES WILL BE REMOVED PRIOR TO SURGERY PLEASE DO NOT  APPLY Poly grip OR ADHESIVES!!!   Do NOT smoke after Midnight   Stop all vitamins and herbal supplements 7 days before surgery.   Take these medicines the morning of surgery with A SIP OF WATER:  Inhalers as usual and bring, synthroid , protonix    DO NOT TAKE ANY ORAL DIABETIC MEDICATIONS DAY OF YOUR SURGERY  Bring CPAP mask  and tubing day of surgery.                              You may not have any metal on your body including hair pins, jewelry, and body piercing             Do not wear make-up, lotions, powders, perfumes/cologne, or deodorant  Do not wear nail polish including gel and S&S, artificial/acrylic nails, or any other type of covering on natural nails including finger and toenails. If you have artificial nails, gel coating, etc. that needs to be removed by a nail salon please have this removed prior to surgery or surgery may need to be canceled/ delayed if the surgeon/ anesthesia feels like they are unable to be safely monitored.   Do not shave  48 hours prior to surgery.               Men may shave face and neck.   Do not bring valuables to the hospital. [facility] IS NOT             RESPONSIBLE   FOR VALUABLES.   Contacts, glasses, dentures or bridgework may not be worn into surgery.   Bring small overnight bag day of surgery.   DO NOT BRING YOUR HOME MEDICATIONS TO THE HOSPITAL. PHARMACY WILL DISPENSE MEDICATIONS LISTED ON YOUR MEDICATION LIST TO YOU DURING YOUR ADMISSION IN THE HOSPITAL!    Patients discharged on the day of surgery will not be allowed to drive home.  Someone NEEDS to stay with you for the first 24 hours after anesthesia.   Special Instructions: Bring a copy of your healthcare power of attorney and living will documents the day of surgery if you haven't scanned them before.              Please read over the following fact sheets you were given: IF YOU HAVE QUESTIONS ABOUT YOUR PRE-OP INSTRUCTIONS PLEASE CALL [phone].   If you received a  COVID test during your pre-op visit  it is requested that you wear a mask when out in public, stay away from anyone that may not be feeling well and notify your surgeon if you develop symptoms. If you test positive for Covid or have been in contact with anyone that has tested positive in the last 10 days please notify you surgeon.    [place] - Preparing for Surgery Before surgery, you can play an important role.  Because skin is not sterile, your skin needs to be as free of germs as possible.  You can reduce the number of germs on your skin by washing with CHG (chlorahexidine gluconate) soap before surgery.  CHG is an antiseptic cleaner which kills germs and bonds with the skin to continue killing germs even after washing. Please DO NOT use if you have an allergy  to CHG or antibacterial soaps.  If your skin becomes reddened/irritated stop using the CHG and inform your nurse when you arrive at Short Stay. Do not shave (including legs and underarms) for at least 48 hours prior to the first CHG shower.  You may shave your face/neck. Please follow these instructions carefully:  1.  Shower with CHG Soap the night before surgery and the  morning of Surgery.  2.  If you choose to wash your hair, wash your hair first as usual with your  normal  shampoo.  3.  After you shampoo, rinse your hair  and body thoroughly to remove the  shampoo.                           4.  Use CHG as you would any other liquid soap.  You can apply chg directly  to the skin and wash                       Gently with a scrungie or clean washcloth.  5.  Apply the CHG Soap to your body ONLY FROM THE NECK DOWN.   Do not use on face/ open                           Wound or open sores. Avoid contact with eyes, ears mouth and genitals (private parts).                       Wash face,  Genitals (private parts) with your normal soap.             6.  Wash thoroughly, paying special attention to the area where your surgery  will be  performed.  7.  Thoroughly rinse your body with warm water from the neck down.  8.  DO NOT shower/wash with your normal soap after using and rinsing off  the CHG Soap.                9.  Pat yourself dry with a clean towel.            10.  Wear clean pajamas.            11.  Place clean sheets on your bed the night of your first shower and do not  sleep with pets. Day of Surgery : Do not apply any lotions/deodorants the morning of surgery.  Please wear clean clothes to the hospital/surgery center.  FAILURE TO FOLLOW THESE INSTRUCTIONS MAY RESULT IN THE CANCELLATION OF YOUR SURGERY PATIENT SIGNATURE_________________________________  NURSE SIGNATURE__________________________________  ________________________________________________________________________

## 2024-01-13 NOTE — Progress Notes (Unsigned)
 [name], MD  Baldwin Channing CROME Ask that PET be read STAT for biopsy review.  Thanks       Previous Messages    ----- Message ----- From: Baldwin Channing CROME Sent: 01/13/2024  10:45 AM EDT To: Channing CROME Baldwin; Taryn F Rigney, RT; Ir Proc* Subject: US  CORE BIOPSY (LYMPH NODES)                  Procedure :US  CORE BIOPSY (LYMPH NODES)  Reason :Lymphadenopathy,abdominal Dx: Lymphadenopathy, abdominal [R59.0 (ICD-10-CM)]    History :CT ABDOMEN PELVIS WO CONTRAST,  Patient is schedule for a NM PET Image Initial (PI) Skull Base To Thigh (F-18 FDG)  tomorrow at  430 pm.  Provider: Shayne Anes, MD  Provider contact ; [phone]

## 2024-01-14 ENCOUNTER — Ambulatory Visit (HOSPITAL_COMMUNITY)
Admission: RE | Admit: 2024-01-14 | Discharge: 2024-01-14 | Disposition: A | Discharge status: Home / Self Care | Source: Ambulatory Visit | Attending: Internal Medicine | Admitting: Internal Medicine

## 2024-01-14 DIAGNOSIS — M899 Disorder of bone, unspecified: Secondary | ICD-10-CM | POA: Insufficient documentation

## 2024-01-14 DIAGNOSIS — I700 Atherosclerosis of aorta: Secondary | ICD-10-CM | POA: Diagnosis not present

## 2024-01-14 DIAGNOSIS — R591 Generalized enlarged lymph nodes: Secondary | ICD-10-CM | POA: Insufficient documentation

## 2024-01-14 DIAGNOSIS — K449 Diaphragmatic hernia without obstruction or gangrene: Secondary | ICD-10-CM | POA: Diagnosis not present

## 2024-01-14 DIAGNOSIS — R161 Splenomegaly, not elsewhere classified: Secondary | ICD-10-CM | POA: Diagnosis not present

## 2024-01-14 DIAGNOSIS — C8590 Non-Hodgkin lymphoma, unspecified, unspecified site: Secondary | ICD-10-CM | POA: Diagnosis not present

## 2024-01-14 DIAGNOSIS — J439 Emphysema, unspecified: Secondary | ICD-10-CM | POA: Diagnosis not present

## 2024-01-14 DIAGNOSIS — J8410 Pulmonary fibrosis, unspecified: Secondary | ICD-10-CM | POA: Insufficient documentation

## 2024-01-14 DIAGNOSIS — N200 Calculus of kidney: Secondary | ICD-10-CM | POA: Diagnosis not present

## 2024-01-14 DIAGNOSIS — R590 Localized enlarged lymph nodes: Secondary | ICD-10-CM

## 2024-01-14 LAB — BCR-ABL1 FISH
Cells Analyzed: 200
Cells Counted: 200

## 2024-01-14 LAB — GLUCOSE, CAPILLARY: Glucose-Capillary: 72 mg/dL (ref 70–99)

## 2024-01-14 MED ORDER — FLUDEOXYGLUCOSE F - 18 (FDG) INJECTION
7.6000 | Freq: Once | INTRAVENOUS | Status: AC
  Administered 2024-01-14: 7.6 via INTRAVENOUS

## 2024-01-15 ENCOUNTER — Encounter: Payer: Self-pay | Admitting: Radiology

## 2024-01-15 ENCOUNTER — Encounter (HOSPITAL_COMMUNITY)

## 2024-01-15 ENCOUNTER — Encounter (HOSPITAL_COMMUNITY)
Admission: RE | Admit: 2024-01-15 | Discharge: 2024-01-15 | Disposition: A | Discharge status: Home / Self Care | Source: Ambulatory Visit | Attending: Surgery | Admitting: Surgery

## 2024-01-15 ENCOUNTER — Ambulatory Visit: Payer: Self-pay | Admitting: Surgery

## 2024-01-15 ENCOUNTER — Encounter (HOSPITAL_COMMUNITY): Payer: Self-pay

## 2024-01-15 ENCOUNTER — Other Ambulatory Visit: Payer: Self-pay

## 2024-01-15 DIAGNOSIS — Z01818 Encounter for other preprocedural examination: Secondary | ICD-10-CM

## 2024-01-15 HISTORY — DX: Personal history of urinary calculi: Z87.442

## 2024-01-15 LAB — FLOW CYTOMETRY

## 2024-01-15 LAB — MULTIPLE MYELOMA PANEL, SERUM
Albumin SerPl Elph-Mcnc: 2.7 g/dL — ABNORMAL LOW (ref 2.9–4.4)
Albumin/Glob SerPl: 0.4 — ABNORMAL LOW (ref 0.7–1.7)
Alpha 1: 0.3 g/dL (ref 0.0–0.4)
Alpha2 Glob SerPl Elph-Mcnc: 0.8 g/dL (ref 0.4–1.0)
B-Globulin SerPl Elph-Mcnc: 1.4 g/dL — ABNORMAL HIGH (ref 0.7–1.3)
Gamma Glob SerPl Elph-Mcnc: 4.3 g/dL — ABNORMAL HIGH (ref 0.4–1.8)
Globulin, Total: 6.8 g/dL — ABNORMAL HIGH (ref 2.2–3.9)
IgA: 263 mg/dL (ref 61–437)
IgG (Immunoglobin G), Serum: 5172 mg/dL — ABNORMAL HIGH (ref 603–1613)
IgM (Immunoglobulin M), Srm: 132 mg/dL (ref 15–143)
M Protein SerPl Elph-Mcnc: 1.6 g/dL — ABNORMAL HIGH
Total Protein ELP: 9.5 g/dL — ABNORMAL HIGH (ref 6.0–8.5)

## 2024-01-15 LAB — SURGICAL PATHOLOGY

## 2024-01-15 NOTE — Anesthesia Preprocedure Evaluation (Signed)
 Anesthesia Evaluation  Patient identified by MRN, date of birth, ID band Patient awake    Reviewed: Allergy  & Precautions, NPO status , Patient's Chart, lab work & pertinent test results  History of Anesthesia Complications Negative for: history of anesthetic complications  Airway Mallampati: II  TM Distance: >3 FB Neck ROM: Full    Dental no notable dental hx. (+) Teeth Intact, Dental Advisory Given   Pulmonary sleep apnea , COPD, former smoker   Pulmonary exam normal breath sounds clear to auscultation       Cardiovascular hypertension, (-) angina (-) Past MI Normal cardiovascular exam Rhythm:Regular Rate:Normal     Neuro/Psych    GI/Hepatic hiatal hernia,GERD  Controlled and Medicated,,  Endo/Other  Hypothyroidism    Renal/GU      Musculoskeletal negative musculoskeletal ROS (+)    Abdominal   Peds  Hematology Lab Results      Component                Value               Date                      WBC                      8.2                 01/09/2024                HGB                      12.3 (L)            01/09/2024                HCT                      37.6 (L)            01/09/2024                MCV                      80.9                01/09/2024                PLT                      97 (L)              01/09/2024              Anesthesia Other Findings Alpha gal  Reproductive/Obstetrics                              Anesthesia Physical Anesthesia Plan  ASA: 3  Anesthesia Plan: General   Post-op Pain Management: Tylenol  PO (pre-op)*   Induction: Intravenous  PONV Risk Score and Plan: 3 and Treatment may vary due to age or medical condition, Propofol  infusion, TIVA and Ondansetron   Airway Management Planned: LMA  Additional Equipment: None  Intra-op Plan:   Post-operative Plan: Extubation in OR  Informed Consent: I have reviewed the patients History and  Physical, chart, labs and discussed the procedure including the risks, benefits and alternatives for the proposed anesthesia with the  patient or authorized representative who has indicated his/her understanding and acceptance.     Dental advisory given  Plan Discussed with: CRNA and Surgeon  Anesthesia Plan Comments:          Anesthesia Quick Evaluation

## 2024-01-15 NOTE — Progress Notes (Signed)
 Douglas [name] LABOR, MD  Douglas [name] L PROCEDURE / BIOPSY REVIEW Date: 01/15/24  Requested Biopsy site: lymphadenopathy.  Any site. Reason for request: lymphadenopathy Imaging review: Best seen on PET  Decision: Approved Imaging modality to perform: Ultrasound Schedule with: No sedation / Local anesthetic Schedule for: Any VIR  Additional comments: @Schedulers .  Please contact me with questions, concerns, or if issue pertaining to this request arise.  [name], MD [facility] Rad[facility]       Previous Messages    ----- Message ----- From: Douglas [name] CROME Sent: 01/15/2024   7:56 AM EDT To: [name] CROME Douglas; Arish Redner F [name], RT; Ir Proc* Subject: US  CORE BIOPSY (LYMPH NODES)                  Procedure :US  CORE BIOPSY (LYMPH NODES)  Reason :Lymphadenopathy,abdominal Dx: Lymphadenopathy, abdominal [R59.0 (ICD-10-CM)]    History :NM PET Image Initial (PI) Skull Base To Thigh (F-18 FDG),CT ABDOMEN PELVIS WO CONTRAST  Provider: Shayne Anes, MD  Provider contact ;  [phone]

## 2024-01-15 NOTE — Progress Notes (Signed)
 Followed up with flow cytometry to see if we could get lab results back today in order to make a confirmed diagnosis for this patient. Flow confirmed that they were working on it currently and results will be available today.

## 2024-01-16 ENCOUNTER — Encounter (HOSPITAL_COMMUNITY): Payer: Self-pay | Admitting: Surgery

## 2024-01-16 ENCOUNTER — Encounter: Admission: RE | Disposition: A | Payer: Self-pay | Attending: Surgery

## 2024-01-16 ENCOUNTER — Ambulatory Visit (HOSPITAL_COMMUNITY): Payer: Self-pay | Admitting: Physician Assistant

## 2024-01-16 ENCOUNTER — Ambulatory Visit (HOSPITAL_COMMUNITY): Payer: Self-pay | Admitting: Anesthesiology

## 2024-01-16 ENCOUNTER — Telehealth: Payer: Self-pay | Admitting: Physician Assistant

## 2024-01-16 ENCOUNTER — Ambulatory Visit (HOSPITAL_COMMUNITY)
Admission: RE | Admit: 2024-01-16 | Discharge: 2024-01-16 | Disposition: A | Discharge status: Home / Self Care | Attending: Surgery | Admitting: Surgery

## 2024-01-16 DIAGNOSIS — R590 Localized enlarged lymph nodes: Secondary | ICD-10-CM | POA: Insufficient documentation

## 2024-01-16 DIAGNOSIS — K219 Gastro-esophageal reflux disease without esophagitis: Secondary | ICD-10-CM | POA: Diagnosis not present

## 2024-01-16 DIAGNOSIS — Z87891 Personal history of nicotine dependence: Secondary | ICD-10-CM | POA: Diagnosis not present

## 2024-01-16 DIAGNOSIS — I10 Essential (primary) hypertension: Secondary | ICD-10-CM

## 2024-01-16 DIAGNOSIS — J449 Chronic obstructive pulmonary disease, unspecified: Secondary | ICD-10-CM | POA: Diagnosis not present

## 2024-01-16 DIAGNOSIS — E039 Hypothyroidism, unspecified: Secondary | ICD-10-CM

## 2024-01-16 DIAGNOSIS — Z01818 Encounter for other preprocedural examination: Secondary | ICD-10-CM

## 2024-01-16 DIAGNOSIS — K449 Diaphragmatic hernia without obstruction or gangrene: Secondary | ICD-10-CM | POA: Diagnosis not present

## 2024-01-16 DIAGNOSIS — R591 Generalized enlarged lymph nodes: Secondary | ICD-10-CM | POA: Diagnosis not present

## 2024-01-16 DIAGNOSIS — G4730 Sleep apnea, unspecified: Secondary | ICD-10-CM | POA: Insufficient documentation

## 2024-01-16 HISTORY — PX: SENTINEL NODE BIOPSY: SHX6608

## 2024-01-16 SURGERY — BIOPSY, LYMPH NODE, SENTINEL
Anesthesia: General | Laterality: Left

## 2024-01-16 MED ORDER — CHLORHEXIDINE GLUCONATE CLOTH 2 % EX PADS: 6.0000 | MEDICATED_PAD | Freq: Once | CUTANEOUS | Status: DC

## 2024-01-16 MED ORDER — FENTANYL CITRATE (PF) 250 MCG/5ML IJ SOLN
INTRAMUSCULAR | Status: DC | PRN
  Administered 2024-01-16: 50 ug via INTRAVENOUS
  Administered 2024-01-16 (×2): 25 ug via INTRAVENOUS

## 2024-01-16 MED ORDER — BUPIVACAINE-EPINEPHRINE 0.25% -1:200000 IJ SOLN
INTRAMUSCULAR | Status: DC | PRN
  Administered 2024-01-16: 10 mL

## 2024-01-16 MED ORDER — FENTANYL CITRATE (PF) 100 MCG/2ML IJ SOLN
INTRAMUSCULAR | Status: AC
  Filled 2024-01-16: qty 2

## 2024-01-16 MED ORDER — ACETAMINOPHEN 10 MG/ML IV SOLN: 1000.0000 mg | Freq: Once | INTRAVENOUS | Status: DC | PRN

## 2024-01-16 MED ORDER — ONDANSETRON HCL 4 MG/2ML IJ SOLN
INTRAMUSCULAR | Status: DC | PRN
Start: 2024-01-16 — End: 2024-01-16
  Administered 2024-01-16: 4 mg via INTRAVENOUS

## 2024-01-16 MED ORDER — 0.9 % SODIUM CHLORIDE (POUR BTL) OPTIME
TOPICAL | Status: DC | PRN
  Administered 2024-01-16: 1000 mL

## 2024-01-16 MED ORDER — PROPOFOL 10 MG/ML IV BOLUS
INTRAVENOUS | Status: AC
  Filled 2024-01-16: qty 20

## 2024-01-16 MED ORDER — BUPIVACAINE-EPINEPHRINE (PF) 0.25% -1:200000 IJ SOLN
INTRAMUSCULAR | Status: AC
Start: 2024-01-16 — End: 2024-01-16
  Filled 2024-01-16: qty 30

## 2024-01-16 MED ORDER — CEFAZOLIN SODIUM-DEXTROSE 2-4 GM/100ML-% IV SOLN
2.0000 g | INTRAVENOUS | Status: AC
  Administered 2024-01-16: 2 g via INTRAVENOUS
  Filled 2024-01-16: qty 100

## 2024-01-16 MED ORDER — LIDOCAINE HCL (PF) 2 % IJ SOLN
INTRAMUSCULAR | Status: DC | PRN
  Administered 2024-01-16: 60 mg via INTRADERMAL

## 2024-01-16 MED ORDER — FENTANYL CITRATE (PF) 50 MCG/ML IJ SOSY: 25.0000 ug | PREFILLED_SYRINGE | INTRAMUSCULAR | Status: DC | PRN

## 2024-01-16 MED ORDER — ACETAMINOPHEN 500 MG PO TABS
1000.0000 mg | ORAL_TABLET | Freq: Once | ORAL | Status: DC
  Filled 2024-01-16: qty 2

## 2024-01-16 MED ORDER — PROPOFOL 10 MG/ML IV BOLUS
INTRAVENOUS | Status: DC | PRN
  Administered 2024-01-16: 120 mg via INTRAVENOUS

## 2024-01-16 MED ORDER — ONDANSETRON HCL 4 MG/2ML IJ SOLN: 4.0000 mg | Freq: Once | INTRAMUSCULAR | Status: DC | PRN

## 2024-01-16 MED ORDER — LACTATED RINGERS IV SOLN: INTRAVENOUS | Status: DC

## 2024-01-16 MED ORDER — ORAL CARE MOUTH RINSE: 15.0000 mL | Freq: Once | OROMUCOSAL | Status: AC

## 2024-01-16 MED ORDER — ACETAMINOPHEN 500 MG PO TABS
1000.0000 mg | ORAL_TABLET | ORAL | Status: AC
  Administered 2024-01-16: 1000 mg via ORAL

## 2024-01-16 MED ORDER — OXYCODONE-ACETAMINOPHEN 5-325 MG PO TABS: 1.0000 | ORAL_TABLET | ORAL | 0 refills | Status: AC | PRN

## 2024-01-16 MED ORDER — CHLORHEXIDINE GLUCONATE 0.12 % MT SOLN
15.0000 mL | Freq: Once | OROMUCOSAL | Status: AC
  Administered 2024-01-16: 15 mL via OROMUCOSAL

## 2024-01-16 SURGICAL SUPPLY — 32 items
BAG DECANTER FOR FLEXI CONT (MISCELLANEOUS) ×2 IMPLANT
BLADE SURG 15 STRL LF DISP TIS (BLADE) ×2 IMPLANT
BLADE SURG SZ11 CARB STEEL (BLADE) ×3 IMPLANT
CHLORAPREP W/TINT 26 (MISCELLANEOUS) ×2 IMPLANT
DERMABOND ADVANCED .7 DNX12 (GAUZE/BANDAGES/DRESSINGS) ×2 IMPLANT
DERMABOND ADVANCED .7 DNX6 (GAUZE/BANDAGES/DRESSINGS) ×1 IMPLANT
DRAPE C-ARM 42X120 X-RAY (DRAPES) ×3 IMPLANT
DRAPE LAPAROTOMY TRNSV 102X78 (DRAPES) ×3 IMPLANT
DRAPE UTILITY XL STRL (DRAPES) ×3 IMPLANT
DRSG TEGADERM 4X4.75 (GAUZE/BANDAGES/DRESSINGS) IMPLANT
ELECT PENCIL ROCKER SW 15FT (MISCELLANEOUS) ×3 IMPLANT
ELECTRODE REM PT RTRN 9FT ADLT (ELECTROSURGICAL) ×2 IMPLANT
GAUZE 4X4 16PLY ~~LOC~~+RFID DBL (SPONGE) ×2 IMPLANT
GAUZE SPONGE 2X2 8PLY STRL LF (GAUZE/BANDAGES/DRESSINGS) ×2 IMPLANT
GAUZE SPONGE 4X4 12PLY STRL (GAUZE/BANDAGES/DRESSINGS) IMPLANT
GLOVE BIO SURGEON STRL SZ7.5 (GLOVE) ×3 IMPLANT
GLOVE BIOGEL PI IND STRL 8 (GLOVE) ×3 IMPLANT
GOWN STRL REUS W/TWL XL LVL3 (GOWN DISPOSABLE) ×2 IMPLANT
KIT BASIN OR (CUSTOM PROCEDURE TRAY) ×3 IMPLANT
MANIFOLD NEPTUNE II (INSTRUMENTS) IMPLANT
NDL HYPO 25X1 1.5 SAFETY (NEEDLE) ×2 IMPLANT
NEEDLE HYPO 25X1 1.5 SAFETY (NEEDLE) ×2 IMPLANT
PACK BASIC VI WITH GOWN DISP (CUSTOM PROCEDURE TRAY) ×3 IMPLANT
SPIKE FLUID TRANSFER (MISCELLANEOUS) IMPLANT
SUT MNCRL AB 4-0 PS2 18 (SUTURE) ×3 IMPLANT
SUT PROLENE 2 0 SH DA (SUTURE) ×6 IMPLANT
SUT VIC AB 3-0 SH 27X BRD (SUTURE) ×2 IMPLANT
SUT VIC AB 3-0 SH 8-18 (SUTURE) IMPLANT
SYR 10ML LL (SYRINGE) ×2 IMPLANT
SYR CONTROL 10ML LL (SYRINGE) ×3 IMPLANT
TOWEL GREEN STERILE FF (TOWEL DISPOSABLE) ×3 IMPLANT
TOWEL OR 17X24 6PK STRL BLUE (TOWEL DISPOSABLE) ×2 IMPLANT

## 2024-01-16 NOTE — Discharge Instructions (Signed)
 POST OPERATIVE INSTRUCTIONS  Thinking Clearly  The anesthesia may cause you to feel different for 1 or 2 days. Do not drive, drink alcohol , or make any big decisions for at least 2 days.  Nutrition When you wake up, you will be able to drink small amounts of liquid. If you do not feel sick, you can slowly advance your diet to regular foods. Continue to drink lots of fluids, usually about 8 to 10 glasses per day. Eat a high-fiber diet so you don't strain during bowel movements. High-Fiber Foods Foods high in fiber include beans, bran cereals and whole-grain breads, peas, dried fruit (figs, apricots, and dates), raspberries, blackberries, strawberries, sweet corn, broccoli, baked potatoes with skin, plums, pears, apples, greens, and nuts. Activity Slowly increase your activity. Be sure to get up and walk every hour or so to prevent blood clots. No heavy lifting or strenuous activity for 4 weeks following surgery to prevent hernias at your incision sites or recurrence of your hernia. It is normal to feel tired. You may need more sleep than usual.  Get your rest but make sure to get up and move around frequently to prevent blood clots and pneumonia.  Work and Return to [employer] can go back to work when you feel well enough. Discuss the timing with your surgeon. You can usually go back to school or work 1 week or less after an laparoscopic or an open repair. If your work requires heavy lifting or strenuous activity you need to be placed on light duty for 4 weeks following surgery. You can return to gym class, sports or other physical activities 4 weeks after surgery.  Wound Care You may experience significant bruising in the groin including into the scrotum in males.  Rest, elevating the groin and scrotum above the level of the heart, ice and compression with tight fitting underwear can help.  Always wash your hands before and after touching near your incision site. Do not soak in a bathtub  until cleared at your follow up appointment. You may take a shower 24 hours after surgery. A small amount of drainage from the incision is normal. If the drainage is thick and yellow or the site is red, you may have an infection, so call your surgeon. If you have a drain in one of your incisions, it will be taken out in office when the drainage stops. Steri-Strips will fall off in 7 to 10 days or they will be removed during your first office visit. If you have dermabond glue covering over the incision, allow the glue to flake off on its own. Protect the new skin, especially from the sun. The sun can burn and cause darker scarring. Your scar will heal in about 4 to 6 weeks and will become softer and continue to fade over the next year.  The cosmetic appearance of the incisions will improve over the course of the first year after surgery. Sensation around your incision will return in a few weeks or months.  Bowel Movements After intestinal surgery, you may have loose watery stools for several days. If watery diarrhea lasts longer than 3 days, contact your surgeon. Pain medication (narcotics) can cause constipation. Increase the fiber in your diet with high-fiber foods if you are constipated. You can take an over the counter stool softener like Colace to avoid constipation.  Additional over the counter medications can also be used if Colace isn't sufficient (for example, Milk of Magnesia or Miralax).  Pain The amount  of pain is different for each person. Some people need only 1 to 3 doses of pain control medication, while others need more. Take alternating doses of tylenol  and ibuprofen around the clock for the first five days following surgery.  This will provide a baseline of pain control and help with inflammation.  Take the narcotic pain medication in addition if needed for severe pain.  Contact Your Surgeon at [phone], if you have: Pain that will not go away Pain that gets worse A fever of  more than 101F (38.3C) Repeated vomiting Swelling, redness, bleeding, or bad-smelling drainage from your wound site Strong abdominal pain No bowel movement or unable to pass gas for 3 days Watery diarrhea lasting longer than 3 days  Pain Control The goal of pain control is to minimize pain, keep you moving and help you heal. Your surgical team will work with you on your pain plan. Most often a combination of therapies and medications are used to control your pain. You may also be given medication (local anesthetic) at the surgical site. This may help control your pain for several days. Extreme pain puts extra stress on your body at a time when your body needs to focus on healing. Do not wait until your pain has reached a level "10" or is unbearable before telling your doctor or nurse. It is much easier to control pain before it becomes severe. Following a laparoscopic procedure, pain is sometimes felt in the shoulder. This is due to the gas inserted into your abdomen during the procedure. Moving and walking helps to decrease the gas and the right shoulder pain.  Use the guide below for ways to manage your post-operative pain. Learn more by going to facs.org/safepaincontrol.  How Intense Is My Pain Common Therapies to Feel Better       I hardly notice my pain, and it does not interfere with my activities.  I notice my pain and it distracts me, but I can still do activities (sitting up, walking, standing).  Non-Medication Therapies  Ice (in a bag, applied over clothing at the surgical site), elevation, rest, meditation, massage, distraction (music, TV, play) walking and mild exercise Splinting the abdomen with pillows +  Non-Opioid Medications Acetaminophen  (Tylenol ) Non-steroidal anti-inflammatory drugs (NSAIDS) Aspirin, Ibuprofen (Motrin, Advil) Naproxen (Aleve) Take these as needed, when you feel pain. Both acetaminophen  and NSAIDs help to decrease pain and swelling  (inflammation).      My pain is hard to ignore and is more noticeable even when I rest.  My pain interferes with my usual activities.  Non-Medication Therapies  +  Non-Opioid medications  Take on a regular schedule (around-the-clock) instead of as needed. (For example, Tylenol  every 6 hours at 9:00 am, 3:00 pm, 9:00 pm, 3:00 am and Motrin every 6 hours at 12:00 am, 6:00 am, 12:00 pm, 6:00 pm)         I am focused on my pain, and I am not doing my daily activities.  I am groaning in pain, and I cannot sleep. I am unable to do anything.  My pain is as bad as it could be, and nothing else matters.  Non-Medication Therapies  +  Around-the-Clock Non-Opioid Medications  +  Short-acting opioids  Opioids should be used with other medications to manage severe pain. Opioids block pain and give a feeling of euphoria (feel high). Addiction, a serious side effect of opioids, is rare with short-term (a few days) use.  Examples of short-acting opioids include: Tramadol (Ultram),  Hydrocodone (Norco, Vicodin), Hydromorphone  (Dilaudid ), Oxycodone  (Oxycontin )     The above directions have been adapted from the [employer] Surgical Patient Education Program.  Please refer to the ACS website if needed: http://chapman.info/.ashx   Deward Foy, MD [facility] [address], [address], [place], [place]  [address] ?  [address] [address], [place], [place]   [address] [phone] ? [phone] ? FAX [phone] Web site: [email or web address]

## 2024-01-16 NOTE — Telephone Encounter (Signed)
 Late entry. Patient contacted on 01/15/24.   I notified Douglas [name] by phone regarding lab results after discussing with Dr. Onesimo. Findings are concerning for Cd5+ lymphoma with the primary concern being Mantle cell lymphoma. Multiple myeloma panel resulted and shows immunofixation shows IgG monoclonal protein with kappa light chain specificity. Per Dr. Onesimo patient will likely need BM BX as well to define this. Labs also show patient does not have immunity to Hepatitis B and will need to repeat the series once work up is complete. Patient is scheduled for an axillary lymph node biopsy on 01/16/24 to aid in diagnosis. Will follow up with patients once surgical pathology results. All of patient's questions were answered and he expressed understanding of the plan provided.

## 2024-01-16 NOTE — Op Note (Signed)
   Patient: Douglas [name] (1940/10/18, [id number])  Date of Surgery: 01/16/2024  Preoperative Diagnosis: LEFT AXILLARY LYMPHADENOPATHY   Postoperative Diagnosis: LEFT AXILLARY LYMPHADENOPATHY   Surgical Procedure: Left axillary lymph node biopsy   Operative Team Members:  Surgeons and Role:    * Rumeal Cullipher, Deward PARAS, MD - Primary   Anesthesiologist: Jefm Garnette DELENA, MD CRNA: Cena Epps, CRNA   Anesthesia: Monitor Anesthesia Care   Fluids:  Total I/O In: 500 [I.V.:500] Out: 5 [Blood:5]  Complications: None  Drains:  none   Specimen:  ID Type Source Tests Collected by Time Destination  1 : Left axillary lymph node Tissue PATH Lymph node biopsy SURGICAL PATHOLOGY Roen Macgowan, Deward PARAS, MD 01/16/2024 [id number]      Disposition:  PACU - hemodynamically stable.  Plan of Care: Discharge to home after PACU    Indications for Procedure: [name] is a 83 y.o. male who presented with lymphadneopathy concerning for lymphoma.  Oncology team requested a lymph node biopsy.  Based on exam and PET scan I recommended left axillary lymph node biopsy.  The procedure itself as well as its risks, benefits and alternatives were discussed.  The risks discussed included but were not limited to the risk of infection, bleeding, damage to nearby structures, and chyloma.  After a full discussion and all questions answered the patient granted consent to proceed.  Findings: left axillary lymph node   Description of Procedure:   On the day stated by the patient taken operating suite.  After anesthesia the patient's left axilla was prepped and draped in usual sterile fashion.  I made an incision along the anterior hairline and dissected down into the axilla.  The axillary fascia was divided and I palpated for the lymphadenopathy.  A lymph node was identified and grasped with an Allis and cautery was used to dissected free of the surrounding tissues.  It was passed off the field as a specimen.   It was sent down to pathology who stated they had adequate tissue for lymphoma diagnostic testing.  The wound was irrigated with saline.  Hemostasis was obtained with electrocautery.  The wound was closed with Vicryl for the deep dermal tissues and 4-0 Monocryl and Dermabond.  All sponge needle counts were correct at the end of this case..  At the end of the case we reviewed the infection status of the case. Patient: Private Patient Elective Case Case: Urgent Infection Present At Time Of Surgery (PATOS): None  Deward Foy, MD General, Bariatric, & Minimally Invasive Surgery [facility] [place]

## 2024-01-16 NOTE — H&P (Signed)
 Admitting Physician: Deward PARAS Dijon Kohlman  Service: General surgery  CC: lymphadenopathy  Subjective   HPI: Douglas [name] is an 83 y.o. male who is here for left axillary lymph node biopsy  Past Medical History:  Diagnosis Date   Allergic rhinitis    Allergy     seasonal   Anal fissure    Bilateral inguinal hernia    Chest pain    Colon polyps    History of kidney stones    Hyperlipidemia    Hypertension    Hypogonadism in male    Hypothyroidism    Osteoporosis    Scrotal mass    Sebaceous cyst    Sleep apnea    CPAP q night - study done 8-10 yrs. ago    Testosterone  deficiency    injections q 3 week- July 8- last injection    Thyroid  disease    Vitamin D  deficiency     Past Surgical History:  Procedure Laterality Date   ANAL FISSURE REPAIR     CHOLECYSTECTOMY     COLONOSCOPY     FIBULA FRACTURE SURGERY     fibula and tibia fracture    FRACTURE SURGERY     L leg -plate & screws - ORIF- fibula   HEMORRHOID SURGERY     HERNIA REPAIR  04/19/2011   bilateral   LAPAROSCOPIC INGUINAL HERNIA REPAIR  04/19/2011   bilateral   LAPAROSCOPY  10/11/2011   Procedure: LAPAROSCOPY DIAGNOSTIC;  Surgeon: Elspeth KYM Schultze, MD;  Location: [facility] OR;  Service: General;  Laterality: N/A;  UMBILICAL EXPLORATION   MASS EXCISION  10/11/2011   Procedure: EXCISION MASS;  Surgeon: Elspeth KYM Schultze, MD;  Location: [facility] OR;  Service: General;  Laterality: N/A;  EXCISION OF UMBILICAL MASS   POLYPECTOMY     right arm surgery      UPPER GASTROINTESTINAL ENDOSCOPY      Family History  Problem Relation Age of Onset   Stroke Mother    Stroke Father    Colon cancer Neg Hx    Rectal cancer Neg Hx    Stomach cancer Neg Hx    Esophageal cancer Neg Hx    Colon polyps Neg Hx    Allergic rhinitis Neg Hx    Asthma Neg Hx    Angioedema Neg Hx    Atopy Neg Hx    Eczema Neg Hx    Immunodeficiency Neg Hx    Urticaria Neg Hx     Social:  reports that he quit smoking about 25 years ago. His  smoking use included cigarettes. He has never used smokeless tobacco. He reports that he does not drink alcohol  and does not use drugs.  Allergies:  Allergies  Allergen Reactions   Alpha-Gal Anaphylaxis    Anaphylaxis (ALLERGY ) (Anaphylaxis), Cough (ALLERGY /intolerance), Respiratory Distress (ALLERGY /intolerance), Shortness Of Breath (ALLERGY /intolerance) (Shortness Of Breath), Wheezing (ALLERGY /intolerance)   Bovine (Beef) Protein-Containing Drug Products    Other Other (See Comments)   Porcine (Pork) Protein-Containing Drug Products     Medications: Current Outpatient Medications  Medication Instructions   albuterol  (VENTOLIN  HFA) 108 (90 Base) MCG/ACT inhaler 2 puffs, Inhalation, Every 6 hours PRN   cetirizine (ZYRTEC) 10 mg, Oral, Daily at bedtime   dorzolamide -timolol  (COSOPT ) 2-0.5 % ophthalmic solution 1 drop, Right Eye, 2 times daily   EPINEPHrine  (EPI-PEN) 0.3 mg, Intramuscular, As needed   levothyroxine  (SYNTHROID ) 125 mcg, Oral, Every morning   Magnesium 500 mg, Oral, Nightly   metroNIDAZOLE (METROGEL) 0.75 % gel 1 Application, 2  times daily   Multiple Vitamin (MULTIVITAMIN WITH MINERALS) TABS tablet 1 tablet, Oral, Nightly   pantoprazole  (PROTONIX ) 40 mg, Oral, Every evening   Prolia  60 mg, Subcutaneous, Every 6 months   simvastatin  (ZOCOR ) 40 mg, Oral, Daily at bedtime   telmisartan (MICARDIS) 40 mg, Oral, Daily at bedtime   testosterone  cypionate (DEPOTESTOSTERONE CYPIONATE) 300 mg, Intramuscular, Every 21 days   TRELEGY ELLIPTA 100-62.5-25 MCG/ACT AEPB 1 puff, Inhalation, Every morning    ROS - all of the below systems have been reviewed with the patient and positives are indicated with bold text General: chills, fever or night sweats Eyes: blurry vision or double vision ENT: epistaxis or sore throat Allergy /Immunology: itchy/watery eyes or nasal congestion Hematologic/Lymphatic: bleeding problems, blood clots or swollen lymph nodes Endocrine: temperature  intolerance or unexpected weight changes Breast: new or changing breast lumps or nipple discharge Resp: cough, shortness of breath, or wheezing CV: chest pain or dyspnea on exertion GI: as per HPI GU: dysuria, trouble voiding, or hematuria MSK: joint pain or joint stiffness Neuro: TIA or stroke symptoms Derm: pruritus and skin lesion changes Psych: anxiety and depression  Objective   PE Blood pressure (!) 152/83, pulse 78, temperature (!) 97.1 F (36.2 C), temperature source Oral, resp. rate 17, SpO2 95%. Constitutional: NAD; conversant; no deformities Eyes: Moist conjunctiva; no lid lag; anicteric; PERRL Neck: Trachea midline; no thyromegaly Lungs: Normal respiratory effort; no tactile fremitus CV: RRR; no palpable thrills; no pitting edema GI: Abd soft, nontender; no palpable hepatosplenomegaly MSK: Normal range of motion of extremities; no clubbing/cyanosis Psychiatric: Appropriate affect; alert and oriented x3 Lymphatic: Bilateral axillary and bilateral inguinal lymphadenopathy  No results found for this or any previous visit (from the past 24 hours).  Imaging Orders  No imaging studies ordered today     Assessment and Plan   Douglas [name] is an 83 y.o. male with lymphadenopathy.  I recommended left axillary lymph node biopsy.  We discussed the procedure, its risks, benefits and alternatives and the patient granted consent to proceed.  Deward JINNY Foy, MD  [facility] Use [email or web address] to contact on call provider

## 2024-01-16 NOTE — Anesthesia Postprocedure Evaluation (Signed)
 Anesthesia Post Note  Patient: Douglas [name]  Procedure(s) Performed: BIOPSY, LYMPH NODE, SENTINEL (Left)     Patient location during evaluation: PACU Anesthesia Type: General Level of consciousness: awake and alert Pain management: pain level controlled Vital Signs Assessment: post-procedure vital signs reviewed and stable Respiratory status: spontaneous breathing, nonlabored ventilation, respiratory function stable and patient connected to nasal cannula oxygen Cardiovascular status: blood pressure returned to baseline and stable Postop Assessment: no apparent nausea or vomiting Anesthetic complications: no   No notable events documented.  Last Vitals:  Vitals:   01/16/24 1056 01/16/24 1100  BP: 137/72 137/72  Pulse:  63  Resp: 16   Temp: (!) 36.3 C   SpO2: 94% 92%    Last Pain:  Vitals:   01/16/24 1056  TempSrc:   PainSc: 0-No pain                 [name]

## 2024-01-16 NOTE — Transfer of Care (Signed)
 Immediate Anesthesia Transfer of Care Note  Patient: Douglas [name]  Procedure(s) Performed: BIOPSY, LYMPH NODE, SENTINEL (Left)  Patient Location: PACU  Anesthesia Type:General  Level of Consciousness: sedated  Airway & Oxygen Therapy: Patient Spontanous Breathing and Patient connected to face mask oxygen  Post-op Assessment: Report given to RN and Post -op Vital signs reviewed and stable  Post vital signs: Reviewed and stable  Last Vitals:  Vitals Value Taken Time  BP 118/61 01/16/24 10:13  Temp    Pulse 60 01/16/24 10:15  Resp 9 01/16/24 10:15  SpO2 99 % 01/16/24 10:15  Vitals shown include unfiled device data.  Last Pain:  Vitals:   01/16/24 0757  TempSrc:   PainSc: 0-No pain         Complications: No notable events documented.

## 2024-01-16 NOTE — Anesthesia Procedure Notes (Signed)
 Procedure Name: LMA Insertion Date/Time: 01/16/2024 9:19 AM  Performed by: Cena Epps, CRNAPre-anesthesia Checklist: Patient identified, Emergency Drugs available, Suction available and Patient being monitored Patient Re-evaluated:Patient Re-evaluated prior to induction Oxygen Delivery Method: Circle System Utilized Preoxygenation: Pre-oxygenation with 100% oxygen Induction Type: IV induction Ventilation: Mask ventilation without difficulty LMA: LMA inserted LMA Size: 4.0 Number of attempts: 1 Airway Equipment and Method: Bite block Placement Confirmation: positive ETCO2 Tube secured with: Tape Dental Injury: Teeth and Oropharynx as per pre-operative assessment

## 2024-01-17 ENCOUNTER — Encounter (HOSPITAL_COMMUNITY): Payer: Self-pay | Admitting: Surgery

## 2024-01-19 ENCOUNTER — Encounter: Payer: Self-pay | Admitting: Physician Assistant

## 2024-01-19 LAB — CALR +MPL + E12-E15  (REFLEX)

## 2024-01-19 LAB — JAK2 V617F RFX CALR/MPL/E12-15

## 2024-01-21 ENCOUNTER — Other Ambulatory Visit: Payer: Self-pay | Admitting: Medical Oncology

## 2024-01-21 ENCOUNTER — Telehealth: Payer: Self-pay | Admitting: Physician Assistant

## 2024-01-21 DIAGNOSIS — R599 Enlarged lymph nodes, unspecified: Secondary | ICD-10-CM

## 2024-01-21 LAB — SURGICAL PATHOLOGY

## 2024-01-21 NOTE — Telephone Encounter (Signed)
 I notified DRADEN COTTINGHAM and spouse by phone regarding axillary lymph node biopsy after discussing with Dr. Onesimo. Biopsy results are showing no malignant cells. Per Dr. Onesimo we recommend additional lab work including CLL FISH panel and peripheral blood FISH for T(11;14) to evaluate for CLL vs mantle cell lymphoma. Labs have been ordered and patient scheduled for a lab appointment tomorrow. Also ordered an IR core needle lymph node biopsy to help with diagnosis. All of patient's questions were answered and he expressed understanding of the plan provided.

## 2024-01-22 ENCOUNTER — Inpatient Hospital Stay

## 2024-01-22 DIAGNOSIS — R599 Enlarged lymph nodes, unspecified: Secondary | ICD-10-CM

## 2024-01-22 DIAGNOSIS — C8318 Mantle cell lymphoma, lymph nodes of multiple sites: Secondary | ICD-10-CM | POA: Diagnosis not present

## 2024-01-23 ENCOUNTER — Encounter (HOSPITAL_COMMUNITY): Payer: Self-pay

## 2024-01-23 ENCOUNTER — Other Ambulatory Visit: Payer: Self-pay | Admitting: Physician Assistant

## 2024-01-23 DIAGNOSIS — R161 Splenomegaly, not elsewhere classified: Secondary | ICD-10-CM

## 2024-01-23 NOTE — Progress Notes (Signed)
 ct

## 2024-01-23 NOTE — Progress Notes (Signed)
 Douglas Cornet, MD  [name] Ok to schedule US  guided left supraclavicular lymph node core biopsy.  Question about bone marrow biopsy too?   If oncology wants a bone marrow biopsy too, we can probably do at same time.  [name]       Previous Messages    ----- Message ----- From: Saniya Tranchina Sent: 01/21/2024   3:58 PM EDT To: Alfons Sulkowski; Ir Procedure Requests Subject: IR LYMPH NODE CORE BIOPSY                      Procedure : IR LYMPH NODE CORE BIOPSY  Reason : rapid diagnostic clinic. pt with labs concerning for cll vs lymphoma. excisional biopsy of axillary lymph node was non diagnostic. Dx: Enlarged lymph nodes [R59.9 (ICD-10-CM)]    History : CT abd pelv w/o , NM PET initial skull base to thigh  Provider : Walisiewicz, Kaitlyn E, PA-C  Contact : [phone]

## 2024-01-25 ENCOUNTER — Encounter: Payer: Self-pay | Admitting: Physician Assistant

## 2024-01-28 LAB — FISH HES LEUKEMIA, 4Q12 REA

## 2024-01-29 ENCOUNTER — Other Ambulatory Visit: Payer: Self-pay | Admitting: Radiology

## 2024-01-29 DIAGNOSIS — R161 Splenomegaly, not elsewhere classified: Secondary | ICD-10-CM

## 2024-01-29 DIAGNOSIS — R591 Generalized enlarged lymph nodes: Secondary | ICD-10-CM

## 2024-01-29 NOTE — H&P (Signed)
 Chief Complaint: Lymphadenopathy/splenomegaly; referred for image guided bone marrow biopsy and left supraclavicular lymph node biopsy  Referring Provider(s): Kale,G  Supervising Physician: [name]  Patient Status: [facility] - Out-pt  History of Present Illness: Douglas [name] is a 83 y.o. male with past medical history significant for hiatal hernia, interstitial pulmonary fibrosis, obstructive sleep apnea, GERD, hypothyroidism, osteoporosis, hypertension, iron deficiency anemia, ITP who presents now with lymphadenopathy and splenomegaly.  Recent left axillary lymph node biopsy revealed reactive lymph node.  PET scan on 01/14/2024 revealed;  1. Extensive hypermetabolic lymphadenopathy involving the neck, chest, abdomen and pelvis. 2. Massive splenomegaly with diffuse and marked hypermetabolism. 3. Diffuse hypermetabolic bone disease.   4. Diffuse hypermetabolic lesions in the thyroid  gland is likely lymphomatous involvement. 5. Overall findings are most consistent with lymphoma (Deauville 5). 6. Severe underlying lung disease with emphysema and pulmonary fibrosis. Patchy areas of parenchymal hypermetabolism likely inflammatory or possibly superimposed infection.   Aortic Atherosclerosis, emphysema   Patient is scheduled today for image guided bone marrow biopsy and left supraclavicular lymph node biopsy for further evaluation  *** Patient is Full Code  Past Medical History:  Diagnosis Date   Allergic rhinitis    Allergy     seasonal   Anal fissure    Bilateral inguinal hernia    Chest pain    Colon polyps    History of kidney stones    Hyperlipidemia    Hypertension    Hypogonadism in male    Hypothyroidism    Osteoporosis    Scrotal mass    Sebaceous cyst    Sleep apnea    CPAP q night - study done 8-10 yrs. ago    Testosterone  deficiency    injections q 3 week- July 8- last injection    Thyroid  disease    Vitamin D  deficiency     Past Surgical History:   Procedure Laterality Date   ANAL FISSURE REPAIR     CHOLECYSTECTOMY     COLONOSCOPY     FIBULA FRACTURE SURGERY     fibula and tibia fracture    FRACTURE SURGERY     L leg -plate & screws - ORIF- fibula   HEMORRHOID SURGERY     HERNIA REPAIR  04/19/2011   bilateral   LAPAROSCOPIC INGUINAL HERNIA REPAIR  04/19/2011   bilateral   LAPAROSCOPY  10/11/2011   Procedure: LAPAROSCOPY DIAGNOSTIC;  Surgeon: Elspeth KYM Schultze, MD;  Location: [facility] OR;  Service: General;  Laterality: N/A;  UMBILICAL EXPLORATION   MASS EXCISION  10/11/2011   Procedure: EXCISION MASS;  Surgeon: Elspeth KYM Schultze, MD;  Location: [facility] OR;  Service: General;  Laterality: N/A;  EXCISION OF UMBILICAL MASS   POLYPECTOMY     right arm surgery      SENTINEL NODE BIOPSY Left 01/16/2024   Procedure: BIOPSY, LYMPH NODE, SENTINEL;  Surgeon: Lyndel Deward PARAS, MD;  Location: [facility] ORS;  Service: General;  Laterality: Left;  LEFT AXILLARY LYMPH NODE   UPPER GASTROINTESTINAL ENDOSCOPY      Allergies: Alpha-gal, Bovine (beef) protein-containing drug products, Other, and Porcine (pork) protein-containing drug products  Medications: Prior to Admission medications   Medication Sig Start Date End Date Taking? Authorizing Provider  cetirizine (ZYRTEC) 10 MG tablet Take 10 mg by mouth at bedtime.    [provider]  denosumab  (PROLIA ) 60 MG/ML SOSY injection Inject 60 mg into the skin every 6 (six) months.    [provider]  dorzolamide -timolol  (COSOPT ) 2-0.5 % ophthalmic solution Place  1 drop into the right eye 2 (two) times daily.    [provider]  EPINEPHrine  0.3 mg/0.3 mL IJ SOAJ injection Inject 0.3 mg into the muscle as needed for anaphylaxis.    [provider]  levothyroxine  (SYNTHROID ) 125 MCG tablet Take 125 mcg by mouth every morning. 06/14/20   [provider]  Magnesium 500 MG CAPS Take 500 mg by mouth at bedtime. 06/08/09   [provider]  metroNIDAZOLE (METROGEL) 0.75 %  gel Apply 1 Application topically in the morning and at bedtime.    [provider]  Multiple Vitamin (MULTIVITAMIN WITH MINERALS) TABS tablet Take 1 tablet by mouth at bedtime.    [provider]  oxyCODONE -acetaminophen  (PERCOCET) 5-325 MG tablet Take 1 tablet by mouth every 4 (four) hours as needed for severe pain (pain score 7-10). 01/16/24 01/15/25  Stechschulte, Deward PARAS, MD  pantoprazole  (PROTONIX ) 40 MG tablet Take 40 mg by mouth every evening.    [provider]  simvastatin  (ZOCOR ) 40 MG tablet Take 40 mg by mouth at bedtime.    [provider]  telmisartan (MICARDIS) 40 MG tablet Take 40 mg by mouth at bedtime.    [provider]  testosterone  cypionate (DEPOTESTOSTERONE CYPIONATE) 200 MG/ML injection Inject 300 mg into the muscle every 21 ( twenty-one) days.    [provider]  TRELEGY ELLIPTA 100-62.5-25 MCG/ACT AEPB Inhale 1 puff into the lungs in the morning.    [provider]     Family History  Problem Relation Age of Onset   Stroke Mother    Stroke Father    Colon cancer Neg Hx    Rectal cancer Neg Hx    Stomach cancer Neg Hx    Esophageal cancer Neg Hx    Colon polyps Neg Hx    Allergic rhinitis Neg Hx    Asthma Neg Hx    Angioedema Neg Hx    Atopy Neg Hx    Eczema Neg Hx    Immunodeficiency Neg Hx    Urticaria Neg Hx     Social History   Socioeconomic History   Marital status: Married    Spouse name: Not on file   Number of children: Not on file   Years of education: Not on file   Highest education level: Not on file  Occupational History   Occupation: retired  Tobacco Use   Smoking status: Former    Current packs/day: 0.00    Types: Cigarettes    Quit date: 06/05/1998    Years since quitting: 25.6   Smokeless tobacco: Never  Vaping Use   Vaping status: Never Used  Substance and Sexual Activity   Alcohol  use: No   Drug use: No   Sexual activity: Not Currently  Other Topics Concern    Not on file  Social History Narrative   Not on file   Social Drivers of Health   Financial Resource Strain: Not on file  Food Insecurity: No Food Insecurity (01/09/2024)   Hunger Vital Sign    Worried About Running Out of Food in the Last Year: Never true    Ran Out of Food in the Last Year: Never true  Transportation Needs: No Transportation Needs (01/09/2024)   PRAPARE - Administrator, Civil Service (Medical): No    Lack of Transportation (Non-Medical): No  Physical Activity: Sufficiently Active (09/10/2021)   Exercise Vital Sign    Days of Exercise per Week: 3 days    Minutes  of Exercise per Session: 60 min  Stress: Not on file  Social Connections: Not on file     Review of Systems  Vital Signs:   Advance Care Plan: No documents on file  Physical Exam  Imaging: NM PET Image Initial (PI) Skull Base To Thigh (F-18 FDG) Result Date: 01/14/2024 CLINICAL DATA:  Initial treatment strategy for lymphadenopathy. EXAM: NUCLEAR MEDICINE PET SKULL BASE TO THIGH TECHNIQUE: 7.6 mCi F-18 FDG was injected intravenously. Full-ring PET imaging was performed from the skull base to thigh after the radiotracer. CT data was obtained and used for attenuation correction and anatomic localization. Fasting blood glucose: 72 mg/dl COMPARISON:  CT scan 89/85/7974 FINDINGS: Mediastinal blood pool activity: SUV max 1.4 Liver activity: SUV max 3.5 NECK: Small multistation hypermetabolic cervical lymph nodes, mainly in the lower neck and supraclavicular regions. SUV max ranges 3.6 to 4.1. There are also multiple hypermetabolic foci in both lobes of the thyroid  but no CT correlate. SUV max is 6.0 in the right upper anterior thyroid  lobe. Incidental CT findings: None CHEST: Numerous scattered hypermetabolic supraclavicular, axillary, mediastinal and hilar lymph nodes. Index left axillary node measures 8 mm on image 60/4. SUV max is 4.8. Right paratracheal node measures 13 mm on image 66/4 and SUV max  is 9.8. Lymph node in the anterior mediastinum on the right side measures 11 mm and SUV max is 8.7. Subcarinal adenopathy has an SUV max of 9.6. Severe underlying lung disease with emphysema and pulmonary fibrosis. Patchy areas of parenchymal hypermetabolism likely inflammatory or possibly superimposed infection. Incidental CT findings: Atherosclerotic calcifications involving the aorta and coronary arteries and aortic valve. Moderate-sized hiatal hernia. ABDOMEN/PELVIS: Massive splenomegaly with diffuse and marked hypermetabolism highly suggestive of lymphoma. There is also an accessory spleen near the splenic hilum which is markedly hypermetabolic. No hepatic, pancreatic, adrenal or renal lesions. Fairly extensive upper abdominal and retroperitoneal lymphadenopathy all demonstrating hypermetabolism. Index left para-aortic node on image 124/4 measures 16 mm and SUV max is 11.6. 10 mm para duodenal node on image 128/4 has an SUV max of 8.2. Pelvic sidewall and external iliac adenopathy. Index external iliac node the left measures 10 mm on image 167/4 and SUV max is 8.0. Right external iliac node measures 9 mm and SUV max is 7.2. Small minimally hypermetabolic inguinal nodes. Incidental CT findings: Dependent layering bladder calculi noted. Advanced vascular calcifications are stable. Renal calculi are noted. Status post cholecystectomy. No biliary dilatation. SKELETON: Diffuse hypermetabolic bone disease involving the axial and appendicular skeleton. Sternal lesion has an SUV max of 5.6. Right humeral disease has an SUV max of 5.7. Diffuse spinal disease has an SUV max of 6.3. Diffuse pelvic disease has an SUV max of 6.0. Incidental CT findings: No visible lytic or sclerotic bone lesions are identified on the CT scan. IMPRESSION: 1. Extensive hypermetabolic lymphadenopathy involving the neck, chest, abdomen and pelvis. 2. Massive splenomegaly with diffuse and marked hypermetabolism. 3. Diffuse hypermetabolic bone  disease. 4. Diffuse hypermetabolic lesions in the thyroid  gland is likely lymphomatous involvement. 5. Overall findings are most consistent with lymphoma (Deauville 5). 6. Severe underlying lung disease with emphysema and pulmonary fibrosis. Patchy areas of parenchymal hypermetabolism likely inflammatory or possibly superimposed infection. Aortic Atherosclerosis (ICD10-I70.0) and Emphysema (ICD10-J43.9). Electronically Signed   By: MYRTIS Stammer M.D.   On: 01/14/2024 18:20   CT ABDOMEN PELVIS WO CONTRAST Result Date: 01/06/2024 EXAM: CT ABDOMEN AND PELVIS WITHOUT CONTRAST 01/06/2024 06:12:00 PM TECHNIQUE: CT of the abdomen and pelvis was performed without the  administration of intravenous contrast. Multiplanar reformatted images are provided for review. Automated exposure control, iterative reconstruction, and/or weight-based adjustment of the mA/kV was utilized to reduce the radiation dose to as low as reasonably achievable. COMPARISON: CT abdomen and pelvis 01/31/2019. CLINICAL HISTORY: Left lower quadrant abdominal pain, Unintended weight loss. FINDINGS: LOWER CHEST: Multifocal ground-glass opacities throughout the bilateral lung bases, right greater than left. Emphysematous changes are seen in the right lung base. LIVER: There is an 8 mm hypodensity in the left lobe of the liver and an 18 mm hypodensity in the left lobe of the liver. These are new from prior. GALLBLADDER AND BILE DUCTS: Gallbladder is surgically absent. No biliary ductal dilatation. SPLEEN: Spleen is markedly enlarged measuring up to 20.8 cm in length. PANCREAS: No acute abnormality. ADRENAL GLANDS: No acute abnormality. KIDNEYS, URETERS AND BLADDER: Punctate bilateral nonobstructing renal calculi. No hydronephrosis. Mild nonspecific bilateral perinephric fat stranding. Bladder is distended. There is mild diffuse bladder wall thickening. Calcifications are seen in the dependent portion of the bladder. GI AND BOWEL: Moderate-sized hiatal  hernia. Stomach demonstrates no acute abnormality. The cecum is high riding. The appendix appears within normal limits. There is no bowel obstruction. PERITONEUM AND RETROPERITONEUM: No ascites. No free air. VASCULATURE: Aorta is normal in caliber. Atherosclerotic calcifications of the aorta. LYMPH NODES: There are numerous new retroperitoneal and upper abdominal enlarged lymph nodes. Enlarged cortical lymph node measuring 3.4 x 2.2 cm. Peritoneal lymph nodes have increased in size near the splenic hilum. Numerous nonenlarged and mildly enlarged mesenteric lymph nodes measuring up to 12 mm. Enlarged retroperitoneal lymph nodes diffusely measuring up to 12 mm. Enlarged left external iliac lymph nodes measured up to 15 mm. Enlarged right external iliac lymph nodes measuring up to 12 mm. Prominent bilateral inguinal lymph nodes. Enlarged gastropancreatic lymph node measuring 11 mm. REPRODUCTIVE ORGANS: Prostate gland is enlarged. BONES AND SOFT TISSUES: No acute osseous abnormality. No focal soft tissue abnormality. IMPRESSION: 1. New multistation lymphadenopathy involving the splenic hilum, upper abdomen retroperitoneum, external iliac chains, and inguinal regions. Findings are concerning for lymphoma or metastatic disease. 2. New Marked splenomegaly measuring up to 20.8 cm. 3. New 8 mm and 18 mm hypodensities in the left hepatic lobe. 4. Moderate hiatal hernia. Electronically signed by: Greig Pique MD 01/06/2024 06:26 PM EDT RP Workstation: [id number]    Labs:  CBC: Recent Labs    03/12/23 0122 03/12/23 0248 03/13/23 1218 03/14/23 0301 01/09/24 1412  WBC 14.1*  --  19.9* 17.3* 8.2  HGB 14.5 14.3 15.2 13.8 12.3*  HCT 43.5 42.0 45.9 41.5 37.6*  PLT 356  --  445* 383 97*    COAGS: No results for input(s): INR, APTT in the last 8760 hours.  BMP: Recent Labs    03/12/23 0122 03/12/23 0248 03/13/23 1218 03/14/23 0301 01/09/24 1412  NA 132* 137 136 136 132*  K 4.0 3.9 4.0 4.1 3.9  CL 104   --  104 107 102  CO2 19*  --  22 23 23   GLUCOSE 158*  --  148* 146* 88  BUN 17  --  26* 27* 25*  CALCIUM 8.7*  --  9.2 8.6* 9.5  CREATININE 1.52*  --  1.31* 1.32* 1.65*  GFRNONAA 46*  --  55* 55* 42*    LIVER FUNCTION TESTS: Recent Labs    03/11/23 2300 03/12/23 0122 03/14/23 0301 01/09/24 1412  BILITOT 0.9 0.7 0.4 0.5  AST 25 24 21  68*  ALT 19 18 22  39  ALKPHOS 53  54 52 116  PROT 7.0 7.2 6.4* 9.9*  ALBUMIN 2.7* 2.6* 2.4* 3.0*    TUMOR MARKERS: No results for input(s): AFPTM, CEA, CA199, CHROMGRNA in the last 8760 hours.  Assessment and Plan: 83 y.o. male with past medical history significant for hiatal hernia, interstitial pulmonary fibrosis, obstructive sleep apnea, GERD, hypothyroidism, osteoporosis, hypertension, iron deficiency anemia, ITP who presents now with lymphadenopathy and splenomegaly.  Recent left axillary lymph node biopsy revealed reactive lymph node.  PET scan on 01/14/2024 revealed;  1. Extensive hypermetabolic lymphadenopathy involving the neck, chest, abdomen and pelvis. 2. Massive splenomegaly with diffuse and marked hypermetabolism. 3. Diffuse hypermetabolic bone disease.   4. Diffuse hypermetabolic lesions in the thyroid  gland is likely lymphomatous involvement. 5. Overall findings are most consistent with lymphoma (Deauville 5). 6. Severe underlying lung disease with emphysema and pulmonary fibrosis. Patchy areas of parenchymal hypermetabolism likely inflammatory or possibly superimposed infection.   Aortic Atherosclerosis, emphysema   Patient is scheduled today for image guided bone marrow biopsy and left supraclavicular lymph node biopsy for further evaluation.Risks and benefits of procedures was discussed with the patient including, but not limited to bleeding, infection, damage to adjacent structures or low yield requiring additional tests.  All of the questions were answered and there is agreement to proceed.  Consent signed and  in chart.    Thank you for allowing our service to participate in FERRIS FIELDEN 's care.  Electronically Signed: D. Franky Rakers, PA-C   01/29/2024, 5:48 PM      I spent a total of  25 minutes   in face to face in clinical consultation, greater than 50% of which was counseling/coordinating care for image guided bone marrow biopsy and left supraclavicular lymph node biopsy

## 2024-01-30 ENCOUNTER — Ambulatory Visit (HOSPITAL_COMMUNITY)
Admission: RE | Admit: 2024-01-30 | Discharge: 2024-01-30 | Disposition: A | Discharge status: Home / Self Care | Source: Ambulatory Visit | Attending: Physician Assistant | Admitting: Physician Assistant

## 2024-01-30 ENCOUNTER — Other Ambulatory Visit: Payer: Self-pay

## 2024-01-30 ENCOUNTER — Encounter (HOSPITAL_COMMUNITY): Payer: Self-pay

## 2024-01-30 ENCOUNTER — Other Ambulatory Visit: Payer: Self-pay | Admitting: Physician Assistant

## 2024-01-30 DIAGNOSIS — I10 Essential (primary) hypertension: Secondary | ICD-10-CM | POA: Diagnosis not present

## 2024-01-30 DIAGNOSIS — Q8909 Congenital malformations of spleen: Secondary | ICD-10-CM | POA: Diagnosis not present

## 2024-01-30 DIAGNOSIS — Z79899 Other long term (current) drug therapy: Secondary | ICD-10-CM | POA: Diagnosis not present

## 2024-01-30 DIAGNOSIS — R590 Localized enlarged lymph nodes: Secondary | ICD-10-CM | POA: Insufficient documentation

## 2024-01-30 DIAGNOSIS — J8410 Pulmonary fibrosis, unspecified: Secondary | ICD-10-CM | POA: Insufficient documentation

## 2024-01-30 DIAGNOSIS — E291 Testicular hypofunction: Secondary | ICD-10-CM | POA: Insufficient documentation

## 2024-01-30 DIAGNOSIS — Z87891 Personal history of nicotine dependence: Secondary | ICD-10-CM | POA: Diagnosis not present

## 2024-01-30 DIAGNOSIS — R591 Generalized enlarged lymph nodes: Secondary | ICD-10-CM

## 2024-01-30 DIAGNOSIS — M810 Age-related osteoporosis without current pathological fracture: Secondary | ICD-10-CM | POA: Diagnosis not present

## 2024-01-30 DIAGNOSIS — G4733 Obstructive sleep apnea (adult) (pediatric): Secondary | ICD-10-CM | POA: Diagnosis not present

## 2024-01-30 DIAGNOSIS — D649 Anemia, unspecified: Secondary | ICD-10-CM | POA: Insufficient documentation

## 2024-01-30 DIAGNOSIS — I2510 Atherosclerotic heart disease of native coronary artery without angina pectoris: Secondary | ICD-10-CM | POA: Diagnosis not present

## 2024-01-30 DIAGNOSIS — C8511 Unspecified B-cell lymphoma, lymph nodes of head, face, and neck: Secondary | ICD-10-CM | POA: Diagnosis not present

## 2024-01-30 DIAGNOSIS — Z79890 Hormone replacement therapy (postmenopausal): Secondary | ICD-10-CM | POA: Diagnosis not present

## 2024-01-30 DIAGNOSIS — R161 Splenomegaly, not elsewhere classified: Secondary | ICD-10-CM | POA: Diagnosis not present

## 2024-01-30 DIAGNOSIS — D696 Thrombocytopenia, unspecified: Secondary | ICD-10-CM | POA: Insufficient documentation

## 2024-01-30 DIAGNOSIS — C8519 Unspecified B-cell lymphoma, extranodal and solid organ sites: Secondary | ICD-10-CM | POA: Diagnosis not present

## 2024-01-30 DIAGNOSIS — K449 Diaphragmatic hernia without obstruction or gangrene: Secondary | ICD-10-CM | POA: Diagnosis not present

## 2024-01-30 DIAGNOSIS — N210 Calculus in bladder: Secondary | ICD-10-CM | POA: Diagnosis not present

## 2024-01-30 DIAGNOSIS — D693 Immune thrombocytopenic purpura: Secondary | ICD-10-CM | POA: Diagnosis not present

## 2024-01-30 DIAGNOSIS — J439 Emphysema, unspecified: Secondary | ICD-10-CM | POA: Insufficient documentation

## 2024-01-30 DIAGNOSIS — M899 Disorder of bone, unspecified: Secondary | ICD-10-CM | POA: Diagnosis not present

## 2024-01-30 DIAGNOSIS — R599 Enlarged lymph nodes, unspecified: Secondary | ICD-10-CM

## 2024-01-30 DIAGNOSIS — I700 Atherosclerosis of aorta: Secondary | ICD-10-CM | POA: Insufficient documentation

## 2024-01-30 DIAGNOSIS — C9110 Chronic lymphocytic leukemia of B-cell type not having achieved remission: Secondary | ICD-10-CM | POA: Diagnosis not present

## 2024-01-30 DIAGNOSIS — E039 Hypothyroidism, unspecified: Secondary | ICD-10-CM | POA: Insufficient documentation

## 2024-01-30 LAB — CBC WITH DIFFERENTIAL/PLATELET
Abs Immature Granulocytes: 0.05 K/uL (ref 0.00–0.07)
Basophils Absolute: 0.2 K/uL — ABNORMAL HIGH (ref 0.0–0.1)
Basophils Relative: 2 %
Eosinophils Absolute: 0.3 K/uL (ref 0.0–0.5)
Eosinophils Relative: 4 %
HCT: 31.8 % — ABNORMAL LOW (ref 39.0–52.0)
Hemoglobin: 9.8 g/dL — ABNORMAL LOW (ref 13.0–17.0)
Immature Granulocytes: 1 %
Lymphocytes Relative: 24 %
Lymphs Abs: 2.1 K/uL (ref 0.7–4.0)
MCH: 25.8 pg — ABNORMAL LOW (ref 26.0–34.0)
MCHC: 30.8 g/dL (ref 30.0–36.0)
MCV: 83.7 fL (ref 80.0–100.0)
Monocytes Absolute: 1.7 K/uL — ABNORMAL HIGH (ref 0.1–1.0)
Monocytes Relative: 19 %
Neutro Abs: 4.5 K/uL (ref 1.7–7.7)
Neutrophils Relative %: 50 %
Platelets: 104 K/uL — ABNORMAL LOW (ref 150–400)
RBC: 3.8 MIL/uL — ABNORMAL LOW (ref 4.22–5.81)
RDW: 17.3 % — ABNORMAL HIGH (ref 11.5–15.5)
Smear Review: NORMAL
WBC: 8.7 K/uL (ref 4.0–10.5)
nRBC: 0 % (ref 0.0–0.2)

## 2024-01-30 MED ORDER — FENTANYL CITRATE (PF) 100 MCG/2ML IJ SOLN
INTRAMUSCULAR | Status: AC | PRN
  Administered 2024-01-30 (×3): 25 ug via INTRAVENOUS

## 2024-01-30 MED ORDER — MIDAZOLAM HCL 2 MG/2ML IJ SOLN
INTRAMUSCULAR | Status: AC
  Filled 2024-01-30: qty 4

## 2024-01-30 MED ORDER — FENTANYL CITRATE (PF) 100 MCG/2ML IJ SOLN
INTRAMUSCULAR | Status: AC
  Filled 2024-01-30: qty 4

## 2024-01-30 MED ORDER — SODIUM CHLORIDE 0.9 % IV SOLN: INTRAVENOUS | Status: DC

## 2024-01-30 MED ORDER — MIDAZOLAM HCL (PF) 2 MG/2ML IJ SOLN
INTRAMUSCULAR | Status: AC | PRN
  Administered 2024-01-30 (×3): .5 mg via INTRAVENOUS

## 2024-01-30 NOTE — Discharge Instructions (Signed)
 Please call Interventional Radiology clinic [phone] with any questions or concerns.  You may remove your dressing and shower tomorrow.   Bone Marrow Aspiration and Bone Marrow Biopsy, Adult, Care After This sheet gives you information about how to care for yourself after your procedure. Your health care provider may also give you more specific instructions. If you have problems or questions, contact your health care provider. What can I expect after the procedure? After the procedure, it is common to have: Mild pain and tenderness. Swelling. Bruising. Follow these instructions at home: Puncture site care Follow instructions from your health care provider about how to take care of the puncture site. Make sure you: Wash your hands with soap and water before and after you change your bandage (dressing). If soap and water are not available, use hand sanitizer. Change your dressing as told by your health care provider. Check your puncture site every day for signs of infection. Check for: More redness, swelling, or pain. Fluid or blood. Warmth. Pus or a bad smell.   Activity Return to your normal activities as told by your health care provider. Ask your health care provider what activities are safe for you. Do not lift anything that is heavier than 10 lb (4.5 kg), or the limit that you are told, until your health care provider says that it is safe. Do not drive for 24 hours if you were given a sedative during your procedure. General instructions Take over-the-counter and prescription medicines only as told by your health care provider. Do not take baths, swim, or use a hot tub until your health care provider approves. Ask your health care provider if you may take showers. You may only be allowed to take sponge baths. If directed, put ice on the affected area. To do this: Put ice in a plastic bag. Place a towel between your skin and the bag. Leave the ice on for 20 minutes, 2-3 times a  day. Keep all follow-up visits as told by your health care provider. This is important.   Contact a health care provider if: Your pain is not controlled with medicine. You have a fever. You have more redness, swelling, or pain around the puncture site. You have fluid or blood coming from the puncture site. Your puncture site feels warm to the touch. You have pus or a bad smell coming from the puncture site. Summary After the procedure, it is common to have mild pain, tenderness, swelling, and bruising. Follow instructions from your health care provider about how to take care of the puncture site and what activities are safe for you. Take over-the-counter and prescription medicines only as told by your health care provider. Contact a health care provider if you have any signs of infection, such as fluid or blood coming from the puncture site. This information is not intended to replace advice given to you by your health care provider. Make sure you discuss any questions you have with your health care provider. Document Revised: 07/28/2018 Document Reviewed: 07/28/2018 Elsevier Patient Education  2021 [employer] Inc.   Moderate Conscious Sedation, Adult, Care After This sheet gives you information about how to care for yourself after your procedure. Your health care provider may also give you more specific instructions. If you have problems or questions, contact your health care provider. What can I expect after the procedure? After the procedure, it is common to have: Sleepiness for several hours. Impaired judgment for several hours. Difficulty with balance. Vomiting if you eat too  soon. Follow these instructions at home: For the time period you were told by your health care provider: Rest. Do not participate in activities where you could fall or become injured. Do not drive or use machinery. Do not drink alcohol. Do not take sleeping pills or medicines that cause drowsiness. Do not  make important decisions or sign legal documents. Do not take care of children on your own.      Eating and drinking Follow the diet recommended by your health care provider. Drink enough fluid to keep your urine pale yellow. If you vomit: Drink water, juice, or soup when you can drink without vomiting. Make sure you have little or no nausea before eating solid foods.   General instructions Take over-the-counter and prescription medicines only as told by your health care provider. Have a responsible adult stay with you for the time you are told. It is important to have someone help care for you until you are awake and alert. Do not smoke. Keep all follow-up visits as told by your health care provider. This is important. Contact a health care provider if: You are still sleepy or having trouble with balance after 24 hours. You feel light-headed. You keep feeling nauseous or you keep vomiting. You develop a rash. You have a fever. You have redness or swelling around the IV site. Get help right away if: You have trouble breathing. You have new-onset confusion at home. Summary After the procedure, it is common to feel sleepy, have impaired judgment, or feel nauseous if you eat too soon. Rest after you get home. Know the things you should not do after the procedure. Follow the diet recommended by your health care provider and drink enough fluid to keep your urine pale yellow. Get help right away if you have trouble breathing or new-onset confusion at home. This information is not intended to replace advice given to you by your health care provider. Make sure you discuss any questions you have with your health care provider. Document Revised: 07/09/2019 Document Reviewed: 02/04/2019 Elsevier Patient Education  2021 [employer].

## 2024-01-30 NOTE — Procedures (Signed)
 Interventional Radiology Procedure Note  Procedure: CT guided bone marrow aspiration and biopsy; US  guided left supraclavicular lymph node biopsy  Complications: None  EBL: < 10 mL  Findings: Aspirate and core biopsy performed of bone marrow in right iliac bone.  18 G core biopsy of 1.6 cm left supraclavicular LN under US  guidance. Four core biopsy samples obtained.  Plan: Bedrest supine x 1 hrs  Tejal Monroy T. Luverne, M.D Pager:  [phone]

## 2024-02-02 LAB — LYMPHOID NGS

## 2024-02-03 DIAGNOSIS — E291 Testicular hypofunction: Secondary | ICD-10-CM | POA: Diagnosis not present

## 2024-02-03 DIAGNOSIS — E039 Hypothyroidism, unspecified: Secondary | ICD-10-CM | POA: Diagnosis not present

## 2024-02-03 DIAGNOSIS — D649 Anemia, unspecified: Secondary | ICD-10-CM | POA: Diagnosis not present

## 2024-02-03 DIAGNOSIS — C9110 Chronic lymphocytic leukemia of B-cell type not having achieved remission: Secondary | ICD-10-CM | POA: Diagnosis not present

## 2024-02-03 DIAGNOSIS — D693 Immune thrombocytopenic purpura: Secondary | ICD-10-CM | POA: Diagnosis not present

## 2024-02-03 DIAGNOSIS — C8519 Unspecified B-cell lymphoma, extranodal and solid organ sites: Secondary | ICD-10-CM | POA: Diagnosis not present

## 2024-02-03 DIAGNOSIS — D696 Thrombocytopenia, unspecified: Secondary | ICD-10-CM | POA: Diagnosis not present

## 2024-02-03 DIAGNOSIS — R591 Generalized enlarged lymph nodes: Secondary | ICD-10-CM | POA: Diagnosis not present

## 2024-02-04 ENCOUNTER — Telehealth: Payer: Self-pay | Admitting: Physician Assistant

## 2024-02-04 ENCOUNTER — Encounter: Payer: Self-pay | Admitting: Physician Assistant

## 2024-02-04 ENCOUNTER — Encounter: Payer: Self-pay | Admitting: Medical Oncology

## 2024-02-04 LAB — SURGICAL PATHOLOGY

## 2024-02-04 MED ORDER — PREDNISONE 20 MG PO TABS: 60.0000 mg | ORAL_TABLET | Freq: Every day | ORAL | 0 refills | Status: AC

## 2024-02-04 NOTE — Telephone Encounter (Signed)
 I notified Douglas [name] by phone regarding biopsy results after discussing with Dr. Onesimo. Findings are consistent with lymphoma. Patient is scheduled to follow up with Dr. Onesimo to finalize treatment recommendations on 02/12/24. Prescription for prednisone  x 10 days sent to pharmacy for patient's significant fatigue. All of patient's questions were answered and he expressed understanding of the plan provided.

## 2024-02-04 NOTE — Progress Notes (Signed)
  [facility]  Diagnostic Nurse Navigator Treatment Team Hand-Off Note  02/04/24  Patient Name:  Douglas [name] Patient MRN:  [id number] Patient DOB:  09-Aug-1940   Patient Care Team: Shayne Anes, MD as PCP - General (Internal Medicine) Dann Candyce RAMAN, MD as PCP - Cardiology (Cardiology)  Chief Complaint Lymphoma  Oncology History   No history exists.    Cancer Staging  No matching staging information was found for the patient.   SDOH Screening and Interventions Updated:  No  SDOH Screenings   Food Insecurity: No Food Insecurity (01/09/2024)  Housing: Unknown (01/09/2024)   Received from [facility]  Transportation Needs: No Transportation Needs (01/09/2024)  Utilities: Not At Risk (01/09/2024)  Alcohol  Screen: Low Risk  (09/10/2021)  Depression (PHQ2-9): Low Risk  (05/07/2023)  Physical Activity: Sufficiently Active (09/10/2021)  Tobacco Use: Medium Risk (01/30/2024)     Genetics Assessment Completed:  No Genetics Referral Made:  no  Care Team Updated:  Yes  Colene KYM Raider, RN, BSN, [facility] Oncology Nurse Navigator, [facility] 02/04/2024 4:29 PM

## 2024-02-06 DIAGNOSIS — E039 Hypothyroidism, unspecified: Secondary | ICD-10-CM | POA: Diagnosis not present

## 2024-02-09 ENCOUNTER — Encounter (HOSPITAL_COMMUNITY): Payer: Self-pay | Admitting: Physician Assistant

## 2024-02-09 LAB — MISC LABCORP TEST (SEND OUT)

## 2024-02-09 LAB — LYMPHOID NGS

## 2024-02-11 NOTE — Progress Notes (Addendum)
 HEMATOLOGY/ONCOLOGY CONSULTATION NOTE  Date of Service: 02/12/2024  Patient Care Team: Shayne Anes, MD as PCP - General (Internal Medicine) Dann Candyce RAMAN, MD as PCP - Cardiology (Cardiology)  CHIEF COMPLAINTS/PURPOSE OF CONSULTATION:  Evaluation of abnormal CT finding: new multistation lymphadenopathy suspicious for lymphoma vs malignancy, splenomegaly, hepatic hypodensities, and acute symptoms of abdominal pain.  HISTORY OF PRESENTING ILLNESS:   Douglas [name] is a wonderful 83 y.o. male who has been referred to us  by Dr. Shayne Anes, MD for evaluation and management of evaluation of abnormal CT finding: new multistation lymphadenopathy suspicious for lymphoma vs malignancy, splenomegaly, hepatic hypodensities, and acute symptoms of abdominal pain. is ambulating with a wheelchair , accompanied by wife   He does endorse feeling fatigued, but this has been improved since starting steroids.   Additionally, he notes his pulmonary fibrosis symptoms have improved. This was diagnosed 4-5 years as idiopathic pulmonary fibrosis. He notes a history of smoking 1 PPD x 35 years. He has not had a lung biopsy, but many scans to confirm this diagnosis. He began Prednisone  60 mg on 02/04/2024.  He is still reportedly experiencing a fair amount of night sweats.  He denies having noticed any new lymph nodes, however did notice a lump in his abdomen. Denies abdominal pain or injuries.   MEDICAL HISTORY:  Past Medical History:  Diagnosis Date   Allergic rhinitis    Allergy     seasonal   Anal fissure    Bilateral inguinal hernia    Chest pain    Colon polyps    History of kidney stones    Hyperlipidemia    Hypertension    Hypogonadism in male    Hypothyroidism    Osteoporosis    Scrotal mass    Sebaceous cyst    Sleep apnea    CPAP q night - study done 8-10 yrs. ago    Testosterone  deficiency    injections q 3 week- July 8- last injection    Thyroid  disease    Vitamin D   deficiency     SURGICAL HISTORY: Past Surgical History:  Procedure Laterality Date   ANAL FISSURE REPAIR     CHOLECYSTECTOMY     COLONOSCOPY     FIBULA FRACTURE SURGERY     fibula and tibia fracture    FRACTURE SURGERY     L leg -plate & screws - ORIF- fibula   HEMORRHOID SURGERY     HERNIA REPAIR  04/19/2011   bilateral   LAPAROSCOPIC INGUINAL HERNIA REPAIR  04/19/2011   bilateral   LAPAROSCOPY  10/11/2011   Procedure: LAPAROSCOPY DIAGNOSTIC;  Surgeon: Elspeth KYM Schultze, MD;  Location: [facility] OR;  Service: General;  Laterality: N/A;  UMBILICAL EXPLORATION   MASS EXCISION  10/11/2011   Procedure: EXCISION MASS;  Surgeon: Elspeth KYM Schultze, MD;  Location: [facility] OR;  Service: General;  Laterality: N/A;  EXCISION OF UMBILICAL MASS   POLYPECTOMY     right arm surgery      SENTINEL NODE BIOPSY Left 01/16/2024   Procedure: BIOPSY, LYMPH NODE, SENTINEL;  Surgeon: Lyndel Deward PARAS, MD;  Location: [facility];  Service: General;  Laterality: Left;  LEFT AXILLARY LYMPH NODE   UPPER GASTROINTESTINAL ENDOSCOPY      SOCIAL HISTORY: Social History   Socioeconomic History   Marital status: Married    Spouse name: Not on file   Number of children: Not on file   Years of education: Not on file   Highest education level: Not on  file  Occupational History   Occupation: retired  Tobacco Use   Smoking status: Former    Current packs/day: 0.00    Types: Cigarettes    Quit date: 06/05/1998    Years since quitting: 25.7   Smokeless tobacco: Never  Vaping Use   Vaping status: Never Used  Substance and Sexual Activity   Alcohol  use: No   Drug use: No   Sexual activity: Not Currently  Other Topics Concern   Not on file  Social History Narrative   Not on file   Social Drivers of Health   Financial Resource Strain: Not on file  Food Insecurity: No Food Insecurity (02/08/2024)   Hunger Vital Sign    Worried About Running Out of Food in the Last Year: Never true    Ran Out of Food in the Last  Year: Never true  Transportation Needs: No Transportation Needs (02/08/2024)   PRAPARE - Administrator, Civil Service (Medical): No    Lack of Transportation (Non-Medical): No  Physical Activity: Sufficiently Active (09/10/2021)   Exercise Vital Sign    Days of Exercise per Week: 3 days    Minutes of Exercise per Session: 60 min  Stress: Not on file  Social Connections: Not on file  Intimate Partner Violence: Not At Risk (03/12/2023)   Humiliation, Afraid, Rape, and Kick questionnaire    Fear of Current or Ex-Partner: No    Emotionally Abused: No    Physically Abused: No    Sexually Abused: No   Social History   Social History Narrative   Not on file    FAMILY HISTORY: Family History  Problem Relation Age of Onset   Stroke Mother    Stroke Father    Colon cancer Neg Hx    Rectal cancer Neg Hx    Stomach cancer Neg Hx    Esophageal cancer Neg Hx    Colon polyps Neg Hx    Allergic rhinitis Neg Hx    Asthma Neg Hx    Angioedema Neg Hx    Atopy Neg Hx    Eczema Neg Hx    Immunodeficiency Neg Hx    Urticaria Neg Hx     ALLERGIES:  is allergic to alpha-gal, bovine (beef) protein-containing drug products, other, and porcine (pork) protein-containing drug products.  MEDICATIONS:  Current Outpatient Medications  Medication Sig Dispense Refill   cetirizine (ZYRTEC) 10 MG tablet Take 10 mg by mouth at bedtime.     denosumab  (PROLIA ) 60 MG/ML SOSY injection Inject 60 mg into the skin every 6 (six) months.     dorzolamide -timolol  (COSOPT ) 2-0.5 % ophthalmic solution Place 1 drop into the right eye 2 (two) times daily.     EPINEPHrine  0.3 mg/0.3 mL IJ SOAJ injection Inject 0.3 mg into the muscle as needed for anaphylaxis.     levothyroxine  (SYNTHROID ) 125 MCG tablet Take 125 mcg by mouth every morning.     Magnesium 500 MG CAPS Take 500 mg by mouth at bedtime.     metroNIDAZOLE (METROGEL) 0.75 % gel Apply 1 Application topically in the morning and at bedtime.      Multiple Vitamin (MULTIVITAMIN WITH MINERALS) TABS tablet Take 1 tablet by mouth at bedtime.     oxyCODONE -acetaminophen  (PERCOCET) 5-325 MG tablet Take 1 tablet by mouth every 4 (four) hours as needed for severe pain (pain score 7-10). 20 tablet 0   pantoprazole  (PROTONIX ) 40 MG tablet Take 40 mg by mouth every evening.  predniSONE  (DELTASONE ) 20 MG tablet Take 3 tablets (60 mg total) by mouth daily for 10 days. 30 tablet 0   simvastatin  (ZOCOR ) 40 MG tablet Take 40 mg by mouth at bedtime.     telmisartan (MICARDIS) 40 MG tablet Take 40 mg by mouth at bedtime.     testosterone  cypionate (DEPOTESTOSTERONE CYPIONATE) 200 MG/ML injection Inject 300 mg into the muscle every 21 ( twenty-one) days.     TRELEGY ELLIPTA 100-62.5-25 MCG/ACT AEPB Inhale 1 puff into the lungs in the morning.     No current facility-administered medications for this visit.    REVIEW OF SYSTEMS:    10 Point review of Systems was done is negative except as noted above.  PHYSICAL EXAMINATION: ECOG PERFORMANCE STATUS: 1 - Symptomatic but completely ambulatory  Vitals:   02/12/24 0914  BP: 130/66  Pulse: 64  Resp: 18  Temp: (!) 97.3 F (36.3 C)  SpO2: 99%   Filed Weights   02/12/24 0914  Weight: 143 lb 11.2 oz (65.2 kg)   Body mass index is 21.22 kg/m.  GENERAL: alert, in no acute distress and comfortable SKIN: no acute rashes, no significant lesions EYES: conjunctiva are pink and non-injected, sclera anicteric OROPHARYNX: MMM, no exudates, no oropharyngeal erythema or ulceration NECK: supple, no JVD LYMPH: no palpable lymphadenopathy in the cervical, axillary or inguinal regions LUNGS: clear to auscultation b/l with normal respiratory effort HEART: regular rate & rhythm ABDOMEN: normoactive bowel sounds, non tender, not distended, no hepatosplenomegaly Extremity: no pedal edema PSYCH: alert & oriented x 3 with fluent speech NEURO: no focal motor/sensory deficits  LABORATORY DATA:  I have reviewed  the data as listed     Latest Ref Rng & Units 02/12/2024   10:17 AM 01/30/2024    7:33 AM 01/09/2024    2:12 PM  CBC  WBC 4.0 - 10.5 K/uL 10.0  8.7  8.2   Hemoglobin 13.0 - 17.0 g/dL 89.6  9.8  87.6   Hematocrit 39.0 - 52.0 % 31.1  31.8  37.6   Platelets 150 - 400 K/uL 113  104  97        Latest Ref Rng & Units 02/12/2024   10:17 AM 01/09/2024    2:12 PM 03/14/2023    3:01 AM  CMP  Glucose 70 - 99 mg/dL 862  88  853   BUN 8 - 23 mg/dL 28  25  27    Creatinine 0.61 - 1.24 mg/dL 8.87  8.34  8.67   Sodium 135 - 145 mmol/L 134  132  136   Potassium 3.5 - 5.1 mmol/L 4.5  3.9  4.1   Chloride 98 - 111 mmol/L 100  102  107   CO2 22 - 32 mmol/L 24  23  23    Calcium 8.9 - 10.3 mg/dL 8.7  9.5  8.6   Total Protein 6.5 - 8.1 g/dL 8.0  9.9  6.4   Total Bilirubin 0.0 - 1.2 mg/dL 0.7  0.5  0.4   Alkaline Phos 38 - 126 U/L 93  116  52   AST 15 - 41 U/L 31  68  21   ALT 0 - 44 U/L 29  39  22    BCR-ABL1 FISH  01/09/2024 01/22/2024  Specimen Type BLOOD  BLOOD  Specimen Type Comment:    Cells Counted 200  200  Cells Analyzed 200  200  FISH Result ABL1 GENE FUSION  NORMAL CLL PANEL    JAK2 V617 reflex CALR/MPL/E12-15 01/09/2024  Specimen Type BLOOD   JAK2 V617F Result NEGATIVE   REFLEX: Reflex to CALR Mutation Analysis, JAK2 Exon 12-15 Mutation Analysis,  and MPL Mutation Analysis is indicated.   Method based next generation sequencing.    CALR +MPL + E12-E15 (reflexed) 01/09/2024  CALR Result NEGATIVE   MPL Result NEGATIVE   E12-15 Result NEGATIVE    Surgical Pathology 01/30/2024 CASE: [id number] PATIENT: Rolly Borello Bone Marrow Report  Reason for Addendum #1:  Immunohistochemistry results  Clinical History: Lymphadenopathy     DIAGNOSIS:  BONE MARROW, ASPIRATE, CLOT, CORE: - Hypercellular bone marrow (80%) involved by CD5 positive mature B-cell lymphoma (approximately 40% of cellularity).  See comment.  PERIPHERAL BLOOD: - Normocytic anemia -  Thrombocytopenia  COMMENT: The patient's history of lymphadenopathy is noted.  The findings on the bone marrow specimen are similar to those on the lymph node specimen i.e. involvement by the patient's lymphoma to approximately 40% of bone marrow cellularity is observed.  Flow cytometric analysis performed on the bone marrow specimen identified a clonal B-cell population constituting 8% of all lymphocytes.  The cells are positive for CD5, CD19, CD20, CD38, and express kappa light chains.  Additional immunohistochemical stains CD23 and cyclin D1 are pending and will be reported in an addendum.  Correlation with pending cytogenetics, low-grade B-cell lymphoma FISH panel and SOX11 IHC is recommended for further assessment.  MICROSCOPIC DESCRIPTION:  PERIPHERAL BLOOD SMEAR: Platelets: Decreased, no platelet clumps identified Erythroid: Anisopoikilocytosis Leukocytes: Adequate, negative for dysplastic granulocytes or blasts  BONE MARROW ASPIRATE: Cellular Erythroid precursors: Erythroid precursors show a full sequence of maturation Granulocytic precursors: Granulocytic precursors show a full sequence of maturation Megakaryocytes: Typical in number and morphology Lymphocytes/plasma cells: Mildly increased plasma cells  TOUCH PREPARATIONS: Cellular, confirmatory of aspirate findings  CLOT AND BIOPSY: The bone marrow clot shows predominantly blood and scattered marrow for evaluation.  The bone marrow biopsy reveals a hypercellular bone marrow (80%) with lymphoid aggregates comprised of medium sized atypical cells with convoluted nuclear membranes and prominent nucleoli.  The lymphoid aggregates constitute approximately 40% of bone marrow cellularity.  Background trilineage hematopoiesis is present.  SPECIAL STAINS: CD3: CD3 highlights T lymphocytes and the lymphoid aggregates CD20: CD20 highlights atypical B lymphocytes in the lymphoid aggregates CD5: CD5 highlights the atypical B  lymphocytes in the lymphoid aggregates, as well as T lymphocytes Bcl-2: Bcl-2 highlights atypical B lymphocytes as well as T lymphocytes BCL6: BCL6 is negative CD138: CD138 highlights plasma cells MUM1: MUM1 highlights plasma cells Kappa/lambda ISH: Kappa/lambda ISH are polyclonal and the plasma cells Ki-67: Ki-67 appears mildly elevated to approximately 20%  IRON STAIN: Iron stains are performed on a bone marrow aspirate or touch imprint smear and section of clot. The controls stained appropriately.       Storage Iron: Scant      Ring Sideroblasts: Not identified  ADDITIONAL DATA/TESTING: Cytogenetics   CELL COUNT DATA:  Bone Marrow count performed on 500 cells shows: Blasts:   5%   Myeloid:  58% Promyelocytes: 1%   Erythroid:     25% Myelocytes:    9%   Lymphocytes:   5% Metamyelocytes:     4%   Plasma cells:  11% Bands:    8% Neutrophils:   25%  M:E ratio:     2.32 Eosinophils:   6% Basophils:     0% Monocytes:     1%  Lab Data: CBC performed on 01/30/2024 shows: WBC: 8.7 k/uL  Neutrophils:   53% Hgb: 9.8 g/dL  Lymphocytes:   27% HCT: 31.8 %    Monocytes:     18% MCV: 83.7 fL   Eosinophils:   1% RDW: 17.3 %    Basophils:     1% PLT: 104 k/uL    GROSS DESCRIPTION:  A: Aspirate smear  B: The specimen is received in B plus fixative, and consists of a 12.0 x 7.0 x 2.0 mm aggregate of red-brown clotted blood.  The specimen is entirely submitted in 1 cassette.  C: The specimen is received in B plus fixative, and consists of 2 cores of red-brown bone, measuring 0.7 and 0.9 cm in length by 0.2 cm in diameter.  The specimen is entirely submitted in 1 cassette following decalcification with Immunocal.  (KL 01/30/2024)   ADDENDUM:   - Cyclin D1 and CD23 stains are negative in the lymphoid aggregates.  Controls are adequate.    ADDENDUM: - Immunohistochemical stains SOX11 performed at [facility] is positive in the neoplastic cells.  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    02/03/2024 Surgical Pathology CASE: [id number] PATIENT: ROSALYNN PIETY Flow Pathology Report     Clinical history: Lymphadenopathy     DIAGNOSIS:  - Abnormal clonal B-cell population identified.  See comment.  COMMENT: Flow cytometric analysis identified an abnormal clonal B-cell population constituting 56% of the lymphocytes.  The cells are positive for CD5, CD19, CD20, CD38 and express kappa light chains.  The findings are consistent with involvement by a CD5 positive mature B-cell lymphoma. Please refer to case [id number] for additional details.   GATING AND PHENOTYPIC ANALYSIS:  Gated population: Flow cytometric immunophenotyping is performed using antibodies to the antigens listed in the table below. Electronic gates are placed around a cell cluster displaying light scatter properties corresponding to: lymphocytes  Abnormal Cells in gated population: 56%  Phenotype of Abnormal Cells: CD5, CD19, CD20, CD38, Kappa                      Lymphoid Antigens       Myeloid Antigens Miscellaneous CD2  NEG  CD10 NEG  CD11b     ND   CD45 POS CD3  NEG  CD19 POS  CD11c     ND   HLA-Dr    ND CD4  NEG  CD20 POS  CD13 ND   CD34 NEG CD5  POS  CD22 ND   CD14 ND   CD38 NEG CD7  NEG  CD79b     ND   CD15 ND   CD138     ND CD8  NEG  CD103     ND   CD16 ND   TdT  ND CD25 ND   CD200     NEG  CD33 ND   CD123     ND TCRab     ND   sKappa    POS  CD64 ND   CD41 ND TCRgd     NEG  sLambda   NEG  CD117     ND   CD61 ND CD56 NEG  cKappa    ND   MPO  ND   CD71 ND CD57 ND   cLambda   ND        CD235aND      GROSS DESCRIPTION:  Reference Tissue case [id number].  01/30/2024 SURGICAL PATHOLOGY CASE: [id number] PATIENT: ROSALYNN PIETY Surgical Pathology Report     Clinical History: Possible lymphoma, lymphadenopathy, 1.6 cm (las)  FINAL MICROSCOPIC DIAGNOSIS:  A. LYMPH NODE, LEFT SUPRACLAVICULAR, BIOPSY: - CD5 positive  mature B-cell lymphoma. See Comment.  COMMENT: - Morphologic evaluation of the needle core biopsy reveals a lymph node with effaced architecture comprised predominantly of medium sized lymphocytes intermixed with larger, more atypical cells. Immunohistochemically the neoplastic cells are positive for CD20, Bcl-2 and show CD5 coexpression.  The cells are negative for CD23.  Cyclin D1 staining is equivocal.  The Ki-67 proliferation index appears somewhat elevated at 30%.  CD10 and BCL6 highlight residual germinal centers. Flow cytometric analysis revealed a clonal B-cell population constituting 56% of the lymphocytes.  The lymphocytes are positive for CD5, CD19, CD20, CD38 and show kappa light chain restriction.  Overall, the findings are consistent with involvement by a B-cell lymphoma. Diagnostic considerations include atypical chronic lymphocytic leukemia/small lymphocytic lymphoma and mantle cell lymphoma.  For further characterization, a low-grade B-cell lymphoma FISH panel and SOX11 immunohistochemical stain will be performed and reported separately.  GROSS DESCRIPTION:  Specimen is received fresh and consists of multiple core and core fragments of tan-white soft tissue, ranging from 0.1 cm to 1.4 cm in length by 0.1 cm in diameter.  Representative portion of tissue is placed in RPMI for possible flow cytometry, the remaining tissue is entirely submitted in 1 cassette.  SHIRLEEN 01/30/2024)     02/09/2024 Cytogenetics   RADIOGRAPHIC STUDIES: I have personally reviewed the radiological images as listed and agreed with the findings in the report. CT US  GUIDED BIOPSY Result Date: 01/30/2024 INDICATION: Diffuse lymphadenopathy and splenomegaly. Inconclusive pathology after excisional biopsy of a left axillary lymph node. The patient is at or ultrasound-guided biopsy of a hypermetabolic left supraclavicular lymph node. EXAM: ULTRASOUND GUIDED CORE BIOPSY OF LEFT SUPRACLAVICULAR LYMPH NODE  MEDICATIONS: None. ANESTHESIA/SEDATION: The procedure immediately followed a bone marrow biopsy or which the patient received 1.5 mg IV versed  and 75 mcg IV fentanyl . PROCEDURE: The procedure, risks, benefits, and alternatives were explained to the patient. Questions regarding the procedure were encouraged and answered. The patient understands and consents to the procedure. A time out was performed prior to initiating the procedure. The left neck was prepped with chlorhexidine  in a sterile fashion, and a sterile drape was applied covering the operative field. A sterile gown and sterile gloves were used for the procedure. Local anesthesia was provided with 1% Lidocaine . After localizing an enlarged left supraclavicular lymph node of, 18 gauge core biopsy device was utilized in obtaining 4 separate core biopsy samples. Samples were submitted in saline. Additional ultrasound was performed. COMPLICATIONS: None immediate. FINDINGS: An enlarged hypoechoic lymph node in the left supraclavicular region posterior to the internal jugular vein and corresponding to a hypermetabolic lymph node seen by prior PET scan measures approximately 1.6 x 1.2 x 1.0 cm by ultrasound. Solid core biopsy samples were obtained. IMPRESSION: Ultrasound-guided core biopsy performed of an enlarged left supraclavicular lymph node measuring up to 1.6 cm by ultrasound. Electronically Signed   By: Marcey Moan M.D.   On: 01/30/2024 11:28   CT BONE MARROW BIOPSY & ASPIRATION Result Date: 01/30/2024 CLINICAL DATA:  Extensive lymphadenopathy and massive splenomegaly. Concern for lymphoma versus other lymphoproliferative process. Need for bone marrow biopsy. EXAM: CT GUIDED BONE MARROW ASPIRATION AND BIOPSY ANESTHESIA/SEDATION: Moderate (conscious) sedation was employed during this procedure. A total of Versed  1.5 mg and Fentanyl  75 mcg was administered intravenously. Moderate Sedation Time: 40 minutes which included bone marrow biopsy and under  ultrasound guided lymph node biopsy. The patient's level of consciousness and  vital signs were monitored continuously by radiology nursing throughout the procedure under my direct supervision. PROCEDURE: The procedure risks, benefits, and alternatives were explained to the patient. Questions regarding the procedure were encouraged and answered. The patient understands and consents to the procedure. A time out was performed prior to initiating the procedure. The right gluteal region was prepped with chlorhexidine . Sterile gown and sterile gloves were used for the procedure. Local anesthesia was provided with 1% Lidocaine . Under CT guidance, an 11 gauge On Control bone cutting needle was advanced from a posterior approach into the right iliac bone. Needle positioning was confirmed with CT. Initial non heparinized and heparinized aspirate samples were obtained of bone marrow. Core biopsy was performed via the On Control drill needle. COMPLICATIONS: None FINDINGS: Inspection of initial aspirate did reveal visible particles. Intact core biopsy sample was obtained. IMPRESSION: CT guided bone marrow biopsy of right posterior iliac bone with both aspirate and core samples obtained. Electronically Signed   By: Marcey Moan M.D.   On: 01/30/2024 11:24   NM PET Image Initial (PI) Skull Base To Thigh (F-18 FDG) Result Date: 01/14/2024 CLINICAL DATA:  Initial treatment strategy for lymphadenopathy. EXAM: NUCLEAR MEDICINE PET SKULL BASE TO THIGH TECHNIQUE: 7.6 mCi F-18 FDG was injected intravenously. Full-ring PET imaging was performed from the skull base to thigh after the radiotracer. CT data was obtained and used for attenuation correction and anatomic localization. Fasting blood glucose: 72 mg/dl COMPARISON:  CT scan 89/85/7974 FINDINGS: Mediastinal blood pool activity: SUV max 1.4 Liver activity: SUV max 3.5 NECK: Small multistation hypermetabolic cervical lymph nodes, mainly in the lower neck and supraclavicular  regions. SUV max ranges 3.6 to 4.1. There are also multiple hypermetabolic foci in both lobes of the thyroid  but no CT correlate. SUV max is 6.0 in the right upper anterior thyroid  lobe. Incidental CT findings: None CHEST: Numerous scattered hypermetabolic supraclavicular, axillary, mediastinal and hilar lymph nodes. Index left axillary node measures 8 mm on image 60/4. SUV max is 4.8. Right paratracheal node measures 13 mm on image 66/4 and SUV max is 9.8. Lymph node in the anterior mediastinum on the right side measures 11 mm and SUV max is 8.7. Subcarinal adenopathy has an SUV max of 9.6. Severe underlying lung disease with emphysema and pulmonary fibrosis. Patchy areas of parenchymal hypermetabolism likely inflammatory or possibly superimposed infection. Incidental CT findings: Atherosclerotic calcifications involving the aorta and coronary arteries and aortic valve. Moderate-sized hiatal hernia. ABDOMEN/PELVIS: Massive splenomegaly with diffuse and marked hypermetabolism highly suggestive of lymphoma. There is also an accessory spleen near the splenic hilum which is markedly hypermetabolic. No hepatic, pancreatic, adrenal or renal lesions. Fairly extensive upper abdominal and retroperitoneal lymphadenopathy all demonstrating hypermetabolism. Index left para-aortic node on image 124/4 measures 16 mm and SUV max is 11.6. 10 mm para duodenal node on image 128/4 has an SUV max of 8.2. Pelvic sidewall and external iliac adenopathy. Index external iliac node the left measures 10 mm on image 167/4 and SUV max is 8.0. Right external iliac node measures 9 mm and SUV max is 7.2. Small minimally hypermetabolic inguinal nodes. Incidental CT findings: Dependent layering bladder calculi noted. Advanced vascular calcifications are stable. Renal calculi are noted. Status post cholecystectomy. No biliary dilatation. SKELETON: Diffuse hypermetabolic bone disease involving the axial and appendicular skeleton. Sternal lesion has  an SUV max of 5.6. Right humeral disease has an SUV max of 5.7. Diffuse spinal disease has an SUV max of 6.3. Diffuse pelvic disease has an SUV  max of 6.0. Incidental CT findings: No visible lytic or sclerotic bone lesions are identified on the CT scan. IMPRESSION: 1. Extensive hypermetabolic lymphadenopathy involving the neck, chest, abdomen and pelvis. 2. Massive splenomegaly with diffuse and marked hypermetabolism. 3. Diffuse hypermetabolic bone disease. 4. Diffuse hypermetabolic lesions in the thyroid  gland is likely lymphomatous involvement. 5. Overall findings are most consistent with lymphoma ([place] 5). 6. Severe underlying lung disease with emphysema and pulmonary fibrosis. Patchy areas of parenchymal hypermetabolism likely inflammatory or possibly superimposed infection. Aortic Atherosclerosis (ICD10-I70.0) and Emphysema (ICD10-J43.9). Electronically Signed   By: MYRTIS Stammer M.D.   On: 01/14/2024 18:20   CT ABDOMEN PELVIS WO CONTRAST Result Date: 01/06/2024 EXAM: CT ABDOMEN AND PELVIS WITHOUT CONTRAST 01/06/2024 06:12:00 PM TECHNIQUE: CT of the abdomen and pelvis was performed without the administration of intravenous contrast. Multiplanar reformatted images are provided for review. Automated exposure control, iterative reconstruction, and/or weight-based adjustment of the mA/kV was utilized to reduce the radiation dose to as low as reasonably achievable. COMPARISON: CT abdomen and pelvis 01/31/2019. CLINICAL HISTORY: Left lower quadrant abdominal pain, Unintended weight loss. FINDINGS: LOWER CHEST: Multifocal ground-glass opacities throughout the bilateral lung bases, right greater than left. Emphysematous changes are seen in the right lung base. LIVER: There is an 8 mm hypodensity in the left lobe of the liver and an 18 mm hypodensity in the left lobe of the liver. These are new from prior. GALLBLADDER AND BILE DUCTS: Gallbladder is surgically absent. No biliary ductal dilatation. SPLEEN: Spleen  is markedly enlarged measuring up to 20.8 cm in length. PANCREAS: No acute abnormality. ADRENAL GLANDS: No acute abnormality. KIDNEYS, URETERS AND BLADDER: Punctate bilateral nonobstructing renal calculi. No hydronephrosis. Mild nonspecific bilateral perinephric fat stranding. Bladder is distended. There is mild diffuse bladder wall thickening. Calcifications are seen in the dependent portion of the bladder. GI AND BOWEL: Moderate-sized hiatal hernia. Stomach demonstrates no acute abnormality. The cecum is high riding. The appendix appears within normal limits. There is no bowel obstruction. PERITONEUM AND RETROPERITONEUM: No ascites. No free air. VASCULATURE: Aorta is normal in caliber. Atherosclerotic calcifications of the aorta. LYMPH NODES: There are numerous new retroperitoneal and upper abdominal enlarged lymph nodes. Enlarged cortical lymph node measuring 3.4 x 2.2 cm. Peritoneal lymph nodes have increased in size near the splenic hilum. Numerous nonenlarged and mildly enlarged mesenteric lymph nodes measuring up to 12 mm. Enlarged retroperitoneal lymph nodes diffusely measuring up to 12 mm. Enlarged left external iliac lymph nodes measured up to 15 mm. Enlarged right external iliac lymph nodes measuring up to 12 mm. Prominent bilateral inguinal lymph nodes. Enlarged gastropancreatic lymph node measuring 11 mm. REPRODUCTIVE ORGANS: Prostate gland is enlarged. BONES AND SOFT TISSUES: No acute osseous abnormality. No focal soft tissue abnormality. IMPRESSION: 1. New multistation lymphadenopathy involving the splenic hilum, upper abdomen retroperitoneum, external iliac chains, and inguinal regions. Findings are concerning for lymphoma or metastatic disease. 2. New Marked splenomegaly measuring up to 20.8 cm. 3. New 8 mm and 18 mm hypodensities in the left hepatic lobe. 4. Moderate hiatal hernia. Electronically signed by: Greig Pique MD 01/06/2024 06:26 PM EDT RP Workstation: [id number]    ASSESSMENT & PLAN:     83 y.o. 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 FISH TP53 wild type.  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 PLAN: - Reviewed 01/30/2024 Surgical Pathology:  Morphologic evaluation of the needle core biopsy reveals a lymph node  with effaced architecture comprised predominantly of medium sized lymphocytes intermixed with larger, more atypical cells. Immunohistochemically the neoplastic cells are positive for CD20, Bcl-2 and show CD5 coexpression.  The cells are negative for CD23.  Cyclin D1 staining is equivocal.  The Ki-67 proliferation index appears somewhat elevated at 30%.  CD10 and BCL6 highlight residual germinal centers. Flow cytometric analysis revealed a clonal B-cell population constituting 56% of the lymphocytes.  The lymphocytes are positive for CD5, CD19, CD20, CD38 and show kappa light chain restriction.   Overall, the findings are consistent with involvement by a B-cell lymphoma. Diagnostic considerations include atypical chronic lymphocytic leukemia/small lymphocytic lymphoma and mantle cell lymphoma.          Discussed in details with Pathologist Dr Ilsa Pottier -- final diagnosis consistent with Cyclin D1 neg Mantle cell lymphoma.   - Reviewed 01/06/2024 CT A/P:  1. New multistation lymphadenopathy involving the splenic hilum, upper abdomen retroperitoneum, external iliac chains, and inguinal regions. Findings are concerning for lymphoma or metastatic disease.  2. New Marked splenomegaly measuring up to 20.8 cm.  3. New 8 mm and 18 mm hypodensities in the left hepatic lobe.  4. Moderate hiatal hernia.   - Reviewed 01/14/2024 PET:  1. Extensive hypermetabolic lymphadenopathy involving the neck, chest, abdomen and pelvis.    Explained that his lymphoma would be considered stage 4, discussed how staging different for lymphomas. 2. Massive splenomegaly with diffuse and marked hypermetabolism.     This should improve with treatment. 3. Diffuse hypermetabolic bone disease.  4. Diffuse hypermetabolic lesions in the thyroid  gland is likely lymphomatous involvement.  5. Overall findings are most consistent with lymphoma ([place]ville 5).  6. Severe underlying lung disease with emphysema and pulmonary fibrosis. Patchy areas of parenchymal hypermetabolism likely inflammatory or possibly superimposed infection. Aortic Atherosclerosis (ICD10-I70.0) and Emphysema (ICD10-J43.9).   -Discussed recommended treatment for his Cyclin D1 neg Mantle cell lymphoma Patient was started on steroids (Prendisone) pending final pathology results since he was getting symptomatic from significant splenomegaly and extensive LNadenopathy. He notes improvement in symptoms with prednisone .  -Discussed recommended treatment of BR x 6 cycles followed by maintenance Rituxan +/- BTKi  - Answered questions about side effects of Rituximab.   He will go thorough an education session.  The first infusions may take 4-6 hours to monitor for reactions.   - Answered questions about side effects of Bendamustine.  - Answered questions about improving his symptoms of weight loss and fatigue.  First action will be treating his lymphoma.  -Allopurinol  for TLS prophylaxis for C1 -prn zofran  and compazine  for Ctx related nausea.   FOLLOW-UP in  Chemo-counseling fro BR Scheduled to start BR C1 from 02/26/2024 (orders are in) He will need rpt labs and MD visit with 10-12 days after starting C1 of treatment for toxicity check   The total time spent in the appointment was 60 minutes* . All of the patient's questions were answered and the patient knows to call the clinic with any problems, questions, or concerns.  Emaline Saran MD MS AAHIVMS [facility] Hematology/Oncology Physician [facility]  *Total Encounter Time as defined by the Centers for Medicare and Medicaid Services includes, in addition to the face-to-face  time of a patient visit (documented in the note above) non-face-to-face time: obtaining and reviewing outside history, ordering and reviewing medications, tests or procedures, care coordination (communications with other health care professionals or caregivers) and documentation in the medical record.  I,Emily Lagle,acting as a neurosurgeon for [employer]  Onesimo, MD.,have documented all relevant documentation on the behalf of Emaline Onesimo, MD,as directed by  Emaline Onesimo, MD while in the presence of Emaline Onesimo, MD.  I have reviewed the above documentation for accuracy and completeness, and I agree with the above.  Laysha Childers, MD

## 2024-02-12 ENCOUNTER — Inpatient Hospital Stay

## 2024-02-12 ENCOUNTER — Inpatient Hospital Stay: Attending: Physician Assistant

## 2024-02-12 ENCOUNTER — Inpatient Hospital Stay: Attending: Physician Assistant | Admitting: Hematology

## 2024-02-12 VITALS — BP 130/66 | HR 64 | Temp 97.3°F | Resp 18 | Wt 143.7 lb

## 2024-02-12 DIAGNOSIS — J84112 Idiopathic pulmonary fibrosis: Secondary | ICD-10-CM | POA: Insufficient documentation

## 2024-02-12 DIAGNOSIS — D630 Anemia in neoplastic disease: Secondary | ICD-10-CM | POA: Insufficient documentation

## 2024-02-12 DIAGNOSIS — C8318 Mantle cell lymphoma, lymph nodes of multiple sites: Secondary | ICD-10-CM | POA: Insufficient documentation

## 2024-02-12 DIAGNOSIS — Z79899 Other long term (current) drug therapy: Secondary | ICD-10-CM | POA: Diagnosis not present

## 2024-02-12 DIAGNOSIS — Z7189 Other specified counseling: Secondary | ICD-10-CM

## 2024-02-12 DIAGNOSIS — N182 Chronic kidney disease, stage 2 (mild): Secondary | ICD-10-CM | POA: Insufficient documentation

## 2024-02-12 LAB — CMP (CANCER CENTER ONLY)
ALT: 29 U/L (ref 0–44)
AST: 31 U/L (ref 15–41)
Albumin: 3 g/dL — ABNORMAL LOW (ref 3.5–5.0)
Alkaline Phosphatase: 93 U/L (ref 38–126)
Anion gap: 10 (ref 5–15)
BUN: 28 mg/dL — ABNORMAL HIGH (ref 8–23)
CO2: 24 mmol/L (ref 22–32)
Calcium: 8.7 mg/dL — ABNORMAL LOW (ref 8.9–10.3)
Chloride: 100 mmol/L (ref 98–111)
Creatinine: 1.12 mg/dL (ref 0.61–1.24)
GFR, Estimated: >60 mL/min (ref >60)
Glucose, Bld: 137 mg/dL — ABNORMAL HIGH (ref 70–99)
Potassium: 4.5 mmol/L (ref 3.5–5.1)
Sodium: 134 mmol/L — ABNORMAL LOW (ref 135–145)
Total Bilirubin: 0.7 mg/dL (ref 0.0–1.2)
Total Protein: 8 g/dL (ref 6.5–8.1)

## 2024-02-12 LAB — CBC WITH DIFFERENTIAL (CANCER CENTER ONLY)
Abs Immature Granulocytes: 0.2 K/uL — ABNORMAL HIGH (ref 0.00–0.07)
Basophils Absolute: 0.2 K/uL — ABNORMAL HIGH (ref 0.0–0.1)
Basophils Relative: 2 %
Eosinophils Absolute: 0 K/uL (ref 0.0–0.5)
Eosinophils Relative: 0 %
HCT: 31.1 % — ABNORMAL LOW (ref 39.0–52.0)
Hemoglobin: 10.3 g/dL — ABNORMAL LOW (ref 13.0–17.0)
Immature Granulocytes: 2 %
Lymphocytes Relative: 17 %
Lymphs Abs: 1.7 K/uL (ref 0.7–4.0)
MCH: 28.4 pg (ref 26.0–34.0)
MCHC: 33.1 g/dL (ref 30.0–36.0)
MCV: 85.7 fL (ref 80.0–100.0)
Monocytes Absolute: 1.1 K/uL — ABNORMAL HIGH (ref 0.1–1.0)
Monocytes Relative: 11 %
Neutro Abs: 6.9 K/uL (ref 1.7–7.7)
Neutrophils Relative %: 68 %
Platelet Count: 113 K/uL — ABNORMAL LOW (ref 150–400)
RBC: 3.63 MIL/uL — ABNORMAL LOW (ref 4.22–5.81)
RDW: 22.7 % — ABNORMAL HIGH (ref 11.5–15.5)
WBC Count: 10 K/uL (ref 4.0–10.5)
nRBC: 0.6 % — ABNORMAL HIGH (ref 0.0–0.2)

## 2024-02-12 LAB — SAMPLE TO BLOOD BANK

## 2024-02-12 LAB — URIC ACID: Uric Acid, Serum: 7.4 mg/dL (ref 3.7–8.6)

## 2024-02-12 LAB — LACTATE DEHYDROGENASE: LDH: 184 U/L (ref 105–235)

## 2024-02-12 MED ORDER — ALLOPURINOL 100 MG PO TABS: 100.0000 mg | ORAL_TABLET | Freq: Two times a day (BID) | ORAL | 0 refills | Status: DC

## 2024-02-12 MED ORDER — DEXAMETHASONE 4 MG PO TABS: 8.0000 mg | ORAL_TABLET | Freq: Every day | ORAL | 1 refills | Status: AC

## 2024-02-12 MED ORDER — ONDANSETRON HCL 8 MG PO TABS: 8.0000 mg | ORAL_TABLET | Freq: Three times a day (TID) | ORAL | 1 refills | Status: AC | PRN

## 2024-02-12 MED ORDER — PROCHLORPERAZINE MALEATE 10 MG PO TABS
10.0000 mg | ORAL_TABLET | Freq: Four times a day (QID) | ORAL | 1 refills | Status: AC | PRN
Start: 2024-02-12 — End: ?

## 2024-02-12 MED ORDER — ACYCLOVIR 400 MG PO TABS: 400.0000 mg | ORAL_TABLET | Freq: Every day | ORAL | 3 refills | Status: AC

## 2024-02-12 NOTE — Progress Notes (Signed)
 START ON PATHWAY REGIMEN - Lymphoma and CLL     Cycles 1 through 6: A cycle is every 28 days:     Acalabrutinib      Rituximab-xxxx      Bendamustine    Cycles 7 through 18: A cycle is every 56 days:     Rituximab-xxxx      Acalabrutinib    Cycles 19 and beyond: A cycle is every 28 days:     Acalabrutinib   **Always confirm dose/schedule in your pharmacy ordering system**  Patient Characteristics: Mantle Cell Lymphoma, Initial Therapy, Aggressive Disease or Treatment Indicated, Stage II - IV, Unfit or Age > 70, TP53 Mutation Not Present Disease Type: Not Applicable Disease Type: Mantle Cell Lymphoma Disease Type: Not Applicable Was TP53 Mutation Testing Completed<= No, TP53 Mutation Testing Not Completed Line of Therapy: Initial Therapy Intent of Therapy: Non-Curative / Palliative Intent, Discussed with Patient

## 2024-02-13 ENCOUNTER — Other Ambulatory Visit: Payer: Self-pay

## 2024-02-13 ENCOUNTER — Encounter (HOSPITAL_COMMUNITY): Payer: Self-pay

## 2024-02-13 DIAGNOSIS — C8318 Mantle cell lymphoma, lymph nodes of multiple sites: Secondary | ICD-10-CM

## 2024-02-13 LAB — SURGICAL PATHOLOGY

## 2024-02-13 MED ORDER — PREDNISONE 20 MG PO TABS: 20.0000 mg | ORAL_TABLET | Freq: Every day | ORAL | 0 refills | Status: DC

## 2024-02-13 NOTE — Progress Notes (Signed)
 Appointment with Dr Onesimo, Landry, RN gave my card to patient d/t my being on PAL. Patient will start chemotherapy 02/26/24. Will continue to monitor.

## 2024-02-16 ENCOUNTER — Inpatient Hospital Stay

## 2024-02-16 LAB — LYMPHOID NGS

## 2024-02-18 NOTE — Progress Notes (Incomplete)
 HEMATOLOGY/ONCOLOGY CONSULTATION NOTE  Date of Service: 02/12/2024  Patient Care Team: Shayne Anes, MD as PCP - General (Internal Medicine) Dann Candyce RAMAN, MD as PCP - Cardiology (Cardiology)  CHIEF COMPLAINTS/PURPOSE OF CONSULTATION:  Evaluation of abnormal CT finding: new multistation lymphadenopathy suspicious for lymphoma vs malignancy, splenomegaly, hepatic hypodensities, and acute symptoms of abdominal pain.  HISTORY OF PRESENTING ILLNESS:   Douglas [name] is a wonderful 83 y.o. male who has been referred to us  by Dr. Shayne Anes, MD for evaluation and management of evaluation of abnormal CT finding: new multistation lymphadenopathy suspicious for lymphoma vs malignancy, splenomegaly, hepatic hypodensities, and acute symptoms of abdominal pain. is ambulating with a wheelchair , accompanied by wife   He does endorse feeling fatigued, but this has been improved since starting steroids.   Additionally, he notes his pulmonary fibrosis symptoms have improved. This was diagnosed 4-5 years as idiopathic pulmonary fibrosis. He notes a history of smoking 1 PPD x 35 years. He has not had a lung biopsy, but many scans to confirm this diagnosis. He began Prednisone  60 mg on 02/04/2024.  He is still reportedly experiencing a fair amount of night sweats.  He denies having noticed any new lymph nodes, however did notice a lump in his abdomen. Denies abdominal pain or injuries.   MEDICAL HISTORY:  Past Medical History:  Diagnosis Date  . Allergic rhinitis   . Allergy     seasonal  . Anal fissure   . Bilateral inguinal hernia   . Chest pain   . Colon polyps   . History of kidney stones   . Hyperlipidemia   . Hypertension   . Hypogonadism in male   . Hypothyroidism   . Osteoporosis   . Scrotal mass   . Sebaceous cyst   . Sleep apnea    CPAP q night - study done 8-10 yrs. ago   . Testosterone  deficiency    injections q 3 week- July 8- last injection   . Thyroid  disease    . Vitamin D  deficiency     SURGICAL HISTORY: Past Surgical History:  Procedure Laterality Date  . ANAL FISSURE REPAIR    . CHOLECYSTECTOMY    . COLONOSCOPY    . FIBULA FRACTURE SURGERY     fibula and tibia fracture   . FRACTURE SURGERY     L leg -plate & screws - ORIF- fibula  . HEMORRHOID SURGERY    . HERNIA REPAIR  04/19/2011   bilateral  . LAPAROSCOPIC INGUINAL HERNIA REPAIR  04/19/2011   bilateral  . LAPAROSCOPY  10/11/2011   Procedure: LAPAROSCOPY DIAGNOSTIC;  Surgeon: Elspeth KYM Schultze, MD;  Location: [facility] OR;  Service: General;  Laterality: N/A;  UMBILICAL EXPLORATION  . MASS EXCISION  10/11/2011   Procedure: EXCISION MASS;  Surgeon: Elspeth KYM Schultze, MD;  Location: [facility] OR;  Service: General;  Laterality: N/A;  EXCISION OF UMBILICAL MASS  . POLYPECTOMY    . right arm surgery     . SENTINEL NODE BIOPSY Left 01/16/2024   Procedure: BIOPSY, LYMPH NODE, SENTINEL;  Surgeon: Stechschulte, Deward PARAS, MD;  Location: [facility];  Service: General;  Laterality: Left;  LEFT AXILLARY LYMPH NODE  . UPPER GASTROINTESTINAL ENDOSCOPY      SOCIAL HISTORY: Social History   Socioeconomic History  . Marital status: Married    Spouse name: Not on file  . Number of children: Not on file  . Years of education: Not on file  . Highest education level: Not on  file  Occupational History  . Occupation: retired  Tobacco Use  . Smoking status: Former    Current packs/day: 0.00    Types: Cigarettes    Quit date: 06/05/1998    Years since quitting: 25.7  . Smokeless tobacco: Never  Vaping Use  . Vaping status: Never Used  Substance and Sexual Activity  . Alcohol  use: No  . Drug use: No  . Sexual activity: Not Currently  Other Topics Concern  . Not on file  Social History Narrative  . Not on file   Social Drivers of Health   Financial Resource Strain: Not on file  Food Insecurity: No Food Insecurity (02/08/2024)   Hunger Vital Sign   . Worried About Programme Researcher, Broadcasting/film/video in the Last Year:  Never true   . Ran Out of Food in the Last Year: Never true  Transportation Needs: No Transportation Needs (02/08/2024)   PRAPARE - Transportation   . Lack of Transportation (Medical): No   . Lack of Transportation (Non-Medical): No  Physical Activity: Sufficiently Active (09/10/2021)   Exercise Vital Sign   . Days of Exercise per Week: 3 days   . Minutes of Exercise per Session: 60 min  Stress: Not on file  Social Connections: Not on file  Intimate Partner Violence: Not At Risk (03/12/2023)   Humiliation, Afraid, Rape, and Kick questionnaire   . Fear of Current or Ex-Partner: No   . Emotionally Abused: No   . Physically Abused: No   . Sexually Abused: No   Social History   Social History Narrative  . Not on file    FAMILY HISTORY: Family History  Problem Relation Age of Onset  . Stroke Mother   . Stroke Father   . Colon cancer Neg Hx   . Rectal cancer Neg Hx   . Stomach cancer Neg Hx   . Esophageal cancer Neg Hx   . Colon polyps Neg Hx   . Allergic rhinitis Neg Hx   . Asthma Neg Hx   . Angioedema Neg Hx   . Atopy Neg Hx   . Eczema Neg Hx   . Immunodeficiency Neg Hx   . Urticaria Neg Hx     ALLERGIES:  is allergic to alpha-gal, bovine (beef) protein-containing drug products, other, and porcine (pork) protein-containing drug products.  MEDICATIONS:  Current Outpatient Medications  Medication Sig Dispense Refill  . cetirizine (ZYRTEC) 10 MG tablet Take 10 mg by mouth at bedtime.    . denosumab  (PROLIA ) 60 MG/ML SOSY injection Inject 60 mg into the skin every 6 (six) months.    . dorzolamide -timolol  (COSOPT ) 2-0.5 % ophthalmic solution Place 1 drop into the right eye 2 (two) times daily.    . EPINEPHrine  0.3 mg/0.3 mL IJ SOAJ injection Inject 0.3 mg into the muscle as needed for anaphylaxis.    . levothyroxine  (SYNTHROID ) 125 MCG tablet Take 125 mcg by mouth every morning.    . Magnesium 500 MG CAPS Take 500 mg by mouth at bedtime.    . metroNIDAZOLE (METROGEL) 0.75  % gel Apply 1 Application topically in the morning and at bedtime.    . Multiple Vitamin (MULTIVITAMIN WITH MINERALS) TABS tablet Take 1 tablet by mouth at bedtime.    . oxyCODONE -acetaminophen  (PERCOCET) 5-325 MG tablet Take 1 tablet by mouth every 4 (four) hours as needed for severe pain (pain score 7-10). 20 tablet 0  . pantoprazole  (PROTONIX ) 40 MG tablet Take 40 mg by mouth every evening.    SABRA  predniSONE  (DELTASONE ) 20 MG tablet Take 3 tablets (60 mg total) by mouth daily for 10 days. 30 tablet 0  . simvastatin  (ZOCOR ) 40 MG tablet Take 40 mg by mouth at bedtime.    SABRA telmisartan (MICARDIS) 40 MG tablet Take 40 mg by mouth at bedtime.    . testosterone  cypionate (DEPOTESTOSTERONE CYPIONATE) 200 MG/ML injection Inject 300 mg into the muscle every 21 ( twenty-one) days.    . TRELEGY ELLIPTA 100-62.5-25 MCG/ACT AEPB Inhale 1 puff into the lungs in the morning.     No current facility-administered medications for this visit.    REVIEW OF SYSTEMS:    10 Point review of Systems was done is negative except as noted above.  PHYSICAL EXAMINATION: ECOG PERFORMANCE STATUS: 1 - Symptomatic but completely ambulatory  Vitals:   02/12/24 0914  BP: 130/66  Pulse: 64  Resp: 18  Temp: (!) 97.3 F (36.3 C)  SpO2: 99%   Filed Weights   02/12/24 0914  Weight: 143 lb 11.2 oz (65.2 kg)   Body mass index is 21.22 kg/m.  GENERAL: alert, in no acute distress and comfortable SKIN: no acute rashes, no significant lesions EYES: conjunctiva are pink and non-injected, sclera anicteric OROPHARYNX: MMM, no exudates, no oropharyngeal erythema or ulceration NECK: supple, no JVD LYMPH: no palpable lymphadenopathy in the cervical, axillary or inguinal regions LUNGS: clear to auscultation b/l with normal respiratory effort HEART: regular rate & rhythm ABDOMEN: normoactive bowel sounds, non tender, not distended, no hepatosplenomegaly Extremity: no pedal edema PSYCH: alert & oriented x 3 with fluent  speech NEURO: no focal motor/sensory deficits  LABORATORY DATA:  I have reviewed the data as listed     Latest Ref Rng & Units 02/12/2024   10:17 AM 01/30/2024    7:33 AM 01/09/2024    2:12 PM  CBC  WBC 4.0 - 10.5 K/uL 10.0  8.7  8.2   Hemoglobin 13.0 - 17.0 g/dL 89.6  9.8  87.6   Hematocrit 39.0 - 52.0 % 31.1  31.8  37.6   Platelets 150 - 400 K/uL 113  104  97        Latest Ref Rng & Units 02/12/2024   10:17 AM 01/09/2024    2:12 PM 03/14/2023    3:01 AM  CMP  Glucose 70 - 99 mg/dL 862  88  853   BUN 8 - 23 mg/dL 28  25  27    Creatinine 0.61 - 1.24 mg/dL 8.87  8.34  8.67   Sodium 135 - 145 mmol/L 134  132  136   Potassium 3.5 - 5.1 mmol/L 4.5  3.9  4.1   Chloride 98 - 111 mmol/L 100  102  107   CO2 22 - 32 mmol/L 24  23  23    Calcium 8.9 - 10.3 mg/dL 8.7  9.5  8.6   Total Protein 6.5 - 8.1 g/dL 8.0  9.9  6.4   Total Bilirubin 0.0 - 1.2 mg/dL 0.7  0.5  0.4   Alkaline Phos 38 - 126 U/L 93  116  52   AST 15 - 41 U/L 31  68  21   ALT 0 - 44 U/L 29  39  22    BCR-ABL1 FISH  01/09/2024 01/22/2024  Specimen Type BLOOD  BLOOD  Specimen Type Comment:    Cells Counted 200  200  Cells Analyzed 200  200  FISH Result ABL1 GENE FUSION  NORMAL CLL PANEL    JAK2 V617 reflex CALR/MPL/E12-15 01/09/2024  Specimen Type BLOOD   JAK2 V617F Result NEGATIVE   REFLEX: Reflex to CALR Mutation Analysis, JAK2 Exon 12-15 Mutation Analysis,  and MPL Mutation Analysis is indicated.   Method based next generation sequencing.    CALR +MPL + E12-E15 (reflexed) 01/09/2024  CALR Result NEGATIVE   MPL Result NEGATIVE   E12-15 Result NEGATIVE    Surgical Pathology 01/30/2024 CASE: [id number] PATIENT: Corrie Lebeck Bone Marrow Report  Reason for Addendum #1:  Immunohistochemistry results  Clinical History: Lymphadenopathy     DIAGNOSIS:  BONE MARROW, ASPIRATE, CLOT, CORE: - Hypercellular bone marrow (80%) involved by CD5 positive mature B-cell lymphoma (approximately 40% of  cellularity).  See comment.  PERIPHERAL BLOOD: - Normocytic anemia - Thrombocytopenia  COMMENT: The patient's history of lymphadenopathy is noted.  The findings on the bone marrow specimen are similar to those on the lymph node specimen i.e. involvement by the patient's lymphoma to approximately 40% of bone marrow cellularity is observed.  Flow cytometric analysis performed on the bone marrow specimen identified a clonal B-cell population constituting 8% of all lymphocytes.  The cells are positive for CD5, CD19, CD20, CD38, and express kappa light chains.  Additional immunohistochemical stains CD23 and cyclin D1 are pending and will be reported in an addendum.  Correlation with pending cytogenetics, low-grade B-cell lymphoma FISH panel and SOX11 IHC is recommended for further assessment.  MICROSCOPIC DESCRIPTION:  PERIPHERAL BLOOD SMEAR: Platelets: Decreased, no platelet clumps identified Erythroid: Anisopoikilocytosis Leukocytes: Adequate, negative for dysplastic granulocytes or blasts  BONE MARROW ASPIRATE: Cellular Erythroid precursors: Erythroid precursors show a full sequence of maturation Granulocytic precursors: Granulocytic precursors show a full sequence of maturation Megakaryocytes: Typical in number and morphology Lymphocytes/plasma cells: Mildly increased plasma cells  TOUCH PREPARATIONS: Cellular, confirmatory of aspirate findings  CLOT AND BIOPSY: The bone marrow clot shows predominantly blood and scattered marrow for evaluation.  The bone marrow biopsy reveals a hypercellular bone marrow (80%) with lymphoid aggregates comprised of medium sized atypical cells with convoluted nuclear membranes and prominent nucleoli.  The lymphoid aggregates constitute approximately 40% of bone marrow cellularity.  Background trilineage hematopoiesis is present.  SPECIAL STAINS: CD3: CD3 highlights T lymphocytes and the lymphoid aggregates CD20: CD20 highlights atypical B  lymphocytes in the lymphoid aggregates CD5: CD5 highlights the atypical B lymphocytes in the lymphoid aggregates, as well as T lymphocytes Bcl-2: Bcl-2 highlights atypical B lymphocytes as well as T lymphocytes BCL6: BCL6 is negative CD138: CD138 highlights plasma cells MUM1: MUM1 highlights plasma cells Kappa/lambda ISH: Kappa/lambda ISH are polyclonal and the plasma cells Ki-67: Ki-67 appears mildly elevated to approximately 20%  IRON STAIN: Iron stains are performed on a bone marrow aspirate or touch imprint smear and section of clot. The controls stained appropriately.       Storage Iron: Scant      Ring Sideroblasts: Not identified  ADDITIONAL DATA/TESTING: Cytogenetics   CELL COUNT DATA:  Bone Marrow count performed on 500 cells shows: Blasts:   5%   Myeloid:  58% Promyelocytes: 1%   Erythroid:     25% Myelocytes:    9%   Lymphocytes:   5% Metamyelocytes:     4%   Plasma cells:  11% Bands:    8% Neutrophils:   25%  M:E ratio:     2.32 Eosinophils:   6% Basophils:     0% Monocytes:     1%  Lab Data: CBC performed on 01/30/2024 shows: WBC: 8.7 k/uL  Neutrophils:   53% Hgb: 9.8 g/dL  Lymphocytes:   27% HCT: 31.8 %    Monocytes:     18% MCV: 83.7 fL   Eosinophils:   1% RDW: 17.3 %    Basophils:     1% PLT: 104 k/uL    GROSS DESCRIPTION:  A: Aspirate smear  B: The specimen is received in B plus fixative, and consists of a 12.0 x 7.0 x 2.0 mm aggregate of red-brown clotted blood.  The specimen is entirely submitted in 1 cassette.  C: The specimen is received in B plus fixative, and consists of 2 cores of red-brown bone, measuring 0.7 and 0.9 cm in length by 0.2 cm in diameter.  The specimen is entirely submitted in 1 cassette following decalcification with Immunocal.  (KL 01/30/2024)   ADDENDUM:   - Cyclin D1 and CD23 stains are negative in the lymphoid aggregates.  Controls are adequate.    ADDENDUM: - Immunohistochemical stains SOX11 performed at  [facility] is positive in the neoplastic cells.  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    02/03/2024 Surgical Pathology CASE: [id number] PATIENT: ROSALYNN PIETY Flow Pathology Report     Clinical history: Lymphadenopathy     DIAGNOSIS:  - Abnormal clonal B-cell population identified.  See comment.  COMMENT: Flow cytometric analysis identified an abnormal clonal B-cell population constituting 56% of the lymphocytes.  The cells are positive for CD5, CD19, CD20, CD38 and express kappa light chains.  The findings are consistent with involvement by a CD5 positive mature B-cell lymphoma. Please refer to case W [id number] for additional details.   GATING AND PHENOTYPIC ANALYSIS:  Gated population: Flow cytometric immunophenotyping is performed using antibodies to the antigens listed in the table below. Electronic gates are placed around a cell cluster displaying light scatter properties corresponding to: lymphocytes  Abnormal Cells in gated population: 56%  Phenotype of Abnormal Cells: CD5, CD19, CD20, CD38, Kappa                      Lymphoid Antigens       Myeloid Antigens Miscellaneous CD2  NEG  CD10 NEG  CD11b     ND   CD45 POS CD3  NEG  CD19 POS  CD11c     ND   HLA-Dr    ND CD4  NEG  CD20 POS  CD13 ND   CD34 NEG CD5  POS  CD22 ND   CD14 ND   CD38 NEG CD7  NEG  CD79b     ND   CD15 ND   CD138     ND CD8  NEG  CD103     ND   CD16 ND   TdT  ND CD25 ND   CD200     NEG  CD33 ND   CD123     ND TCRab     ND   sKappa    POS  CD64 ND   CD41 ND TCRgd     NEG  sLambda   NEG  CD117     ND   CD61 ND CD56 NEG  cKappa    ND   MPO  ND   CD71 ND CD57 ND   cLambda   ND        CD235aND      GROSS DESCRIPTION:  Reference Tissue case [id number].  01/30/2024 SURGICAL PATHOLOGY CASE: [id number] PATIENT: ROSALYNN PIETY Surgical Pathology Report     Clinical History: Possible lymphoma, lymphadenopathy, 1.6 cm (las)  FINAL  MICROSCOPIC DIAGNOSIS:  A. LYMPH NODE, LEFT SUPRACLAVICULAR, BIOPSY: - CD5 positive mature B-cell lymphoma. See Comment.  COMMENT: - Morphologic evaluation of the needle core biopsy reveals a lymph node with effaced architecture comprised predominantly of medium sized lymphocytes intermixed with larger, more atypical cells. Immunohistochemically the neoplastic cells are positive for CD20, Bcl-2 and show CD5 coexpression.  The cells are negative for CD23.  Cyclin D1 staining is equivocal.  The Ki-67 proliferation index appears somewhat elevated at 30%.  CD10 and BCL6 highlight residual germinal centers. Flow cytometric analysis revealed a clonal B-cell population constituting 56% of the lymphocytes.  The lymphocytes are positive for CD5, CD19, CD20, CD38 and show kappa light chain restriction.  Overall, the findings are consistent with involvement by a B-cell lymphoma. Diagnostic considerations include atypical chronic lymphocytic leukemia/small lymphocytic lymphoma and mantle cell lymphoma.  For further characterization, a low-grade B-cell lymphoma FISH panel and SOX11 immunohistochemical stain will be performed and reported separately.  GROSS DESCRIPTION:  Specimen is received fresh and consists of multiple core and core fragments of tan-white soft tissue, ranging from 0.1 cm to 1.4 cm in length by 0.1 cm in diameter.  Representative portion of tissue is placed in RPMI for possible flow cytometry, the remaining tissue is entirely submitted in 1 cassette.  SHIRLEEN 01/30/2024)     02/09/2024 Cytogenetics   RADIOGRAPHIC STUDIES: I have personally reviewed the radiological images as listed and agreed with the findings in the report. CT US  GUIDED BIOPSY Result Date: 01/30/2024 INDICATION: Diffuse lymphadenopathy and splenomegaly. Inconclusive pathology after excisional biopsy of a left axillary lymph node. The patient is at or ultrasound-guided biopsy of a hypermetabolic left  supraclavicular lymph node. EXAM: ULTRASOUND GUIDED CORE BIOPSY OF LEFT SUPRACLAVICULAR LYMPH NODE MEDICATIONS: None. ANESTHESIA/SEDATION: The procedure immediately followed a bone marrow biopsy or which the patient received 1.5 mg IV versed  and 75 mcg IV fentanyl . PROCEDURE: The procedure, risks, benefits, and alternatives were explained to the patient. Questions regarding the procedure were encouraged and answered. The patient understands and consents to the procedure. A time out was performed prior to initiating the procedure. The left neck was prepped with chlorhexidine  in a sterile fashion, and a sterile drape was applied covering the operative field. A sterile gown and sterile gloves were used for the procedure. Local anesthesia was provided with 1% Lidocaine . After localizing an enlarged left supraclavicular lymph node of, 18 gauge core biopsy device was utilized in obtaining 4 separate core biopsy samples. Samples were submitted in saline. Additional ultrasound was performed. COMPLICATIONS: None immediate. FINDINGS: An enlarged hypoechoic lymph node in the left supraclavicular region posterior to the internal jugular vein and corresponding to a hypermetabolic lymph node seen by prior PET scan measures approximately 1.6 x 1.2 x 1.0 cm by ultrasound. Solid core biopsy samples were obtained. IMPRESSION: Ultrasound-guided core biopsy performed of an enlarged left supraclavicular lymph node measuring up to 1.6 cm by ultrasound. Electronically Signed   By: Marcey Moan M.D.   On: 01/30/2024 11:28   CT BONE MARROW BIOPSY & ASPIRATION Result Date: 01/30/2024 CLINICAL DATA:  Extensive lymphadenopathy and massive splenomegaly. Concern for lymphoma versus other lymphoproliferative process. Need for bone marrow biopsy. EXAM: CT GUIDED BONE MARROW ASPIRATION AND BIOPSY ANESTHESIA/SEDATION: Moderate (conscious) sedation was employed during this procedure. A total of Versed  1.5 mg and Fentanyl  75 mcg was administered  intravenously. Moderate Sedation Time: 40 minutes which included bone marrow biopsy and under ultrasound guided lymph node biopsy. The patient's level of consciousness and  vital signs were monitored continuously by radiology nursing throughout the procedure under my direct supervision. PROCEDURE: The procedure risks, benefits, and alternatives were explained to the patient. Questions regarding the procedure were encouraged and answered. The patient understands and consents to the procedure. A time out was performed prior to initiating the procedure. The right gluteal region was prepped with chlorhexidine . Sterile gown and sterile gloves were used for the procedure. Local anesthesia was provided with 1% Lidocaine . Under CT guidance, an 11 gauge On Control bone cutting needle was advanced from a posterior approach into the right iliac bone. Needle positioning was confirmed with CT. Initial non heparinized and heparinized aspirate samples were obtained of bone marrow. Core biopsy was performed via the On Control drill needle. COMPLICATIONS: None FINDINGS: Inspection of initial aspirate did reveal visible particles. Intact core biopsy sample was obtained. IMPRESSION: CT guided bone marrow biopsy of right posterior iliac bone with both aspirate and core samples obtained. Electronically Signed   By: Marcey Moan M.D.   On: 01/30/2024 11:24   NM PET Image Initial (PI) Skull Base To Thigh (F-18 FDG) Result Date: 01/14/2024 CLINICAL DATA:  Initial treatment strategy for lymphadenopathy. EXAM: NUCLEAR MEDICINE PET SKULL BASE TO THIGH TECHNIQUE: 7.6 mCi F-18 FDG was injected intravenously. Full-ring PET imaging was performed from the skull base to thigh after the radiotracer. CT data was obtained and used for attenuation correction and anatomic localization. Fasting blood glucose: 72 mg/dl COMPARISON:  CT scan 89/85/7974 FINDINGS: Mediastinal blood pool activity: SUV max 1.4 Liver activity: SUV max 3.5 NECK: Small  multistation hypermetabolic cervical lymph nodes, mainly in the lower neck and supraclavicular regions. SUV max ranges 3.6 to 4.1. There are also multiple hypermetabolic foci in both lobes of the thyroid  but no CT correlate. SUV max is 6.0 in the right upper anterior thyroid  lobe. Incidental CT findings: None CHEST: Numerous scattered hypermetabolic supraclavicular, axillary, mediastinal and hilar lymph nodes. Index left axillary node measures 8 mm on image 60/4. SUV max is 4.8. Right paratracheal node measures 13 mm on image 66/4 and SUV max is 9.8. Lymph node in the anterior mediastinum on the right side measures 11 mm and SUV max is 8.7. Subcarinal adenopathy has an SUV max of 9.6. Severe underlying lung disease with emphysema and pulmonary fibrosis. Patchy areas of parenchymal hypermetabolism likely inflammatory or possibly superimposed infection. Incidental CT findings: Atherosclerotic calcifications involving the aorta and coronary arteries and aortic valve. Moderate-sized hiatal hernia. ABDOMEN/PELVIS: Massive splenomegaly with diffuse and marked hypermetabolism highly suggestive of lymphoma. There is also an accessory spleen near the splenic hilum which is markedly hypermetabolic. No hepatic, pancreatic, adrenal or renal lesions. Fairly extensive upper abdominal and retroperitoneal lymphadenopathy all demonstrating hypermetabolism. Index left para-aortic node on image 124/4 measures 16 mm and SUV max is 11.6. 10 mm para duodenal node on image 128/4 has an SUV max of 8.2. Pelvic sidewall and external iliac adenopathy. Index external iliac node the left measures 10 mm on image 167/4 and SUV max is 8.0. Right external iliac node measures 9 mm and SUV max is 7.2. Small minimally hypermetabolic inguinal nodes. Incidental CT findings: Dependent layering bladder calculi noted. Advanced vascular calcifications are stable. Renal calculi are noted. Status post cholecystectomy. No biliary dilatation. SKELETON: Diffuse  hypermetabolic bone disease involving the axial and appendicular skeleton. Sternal lesion has an SUV max of 5.6. Right humeral disease has an SUV max of 5.7. Diffuse spinal disease has an SUV max of 6.3. Diffuse pelvic disease has an SUV  max of 6.0. Incidental CT findings: No visible lytic or sclerotic bone lesions are identified on the CT scan. IMPRESSION: 1. Extensive hypermetabolic lymphadenopathy involving the neck, chest, abdomen and pelvis. 2. Massive splenomegaly with diffuse and marked hypermetabolism. 3. Diffuse hypermetabolic bone disease. 4. Diffuse hypermetabolic lesions in the thyroid  gland is likely lymphomatous involvement. 5. Overall findings are most consistent with lymphoma ([place] 5). 6. Severe underlying lung disease with emphysema and pulmonary fibrosis. Patchy areas of parenchymal hypermetabolism likely inflammatory or possibly superimposed infection. Aortic Atherosclerosis (ICD10-I70.0) and Emphysema (ICD10-J43.9). Electronically Signed   By: MYRTIS Stammer M.D.   On: 01/14/2024 18:20   CT ABDOMEN PELVIS WO CONTRAST Result Date: 01/06/2024 EXAM: CT ABDOMEN AND PELVIS WITHOUT CONTRAST 01/06/2024 06:12:00 PM TECHNIQUE: CT of the abdomen and pelvis was performed without the administration of intravenous contrast. Multiplanar reformatted images are provided for review. Automated exposure control, iterative reconstruction, and/or weight-based adjustment of the mA/kV was utilized to reduce the radiation dose to as low as reasonably achievable. COMPARISON: CT abdomen and pelvis 01/31/2019. CLINICAL HISTORY: Left lower quadrant abdominal pain, Unintended weight loss. FINDINGS: LOWER CHEST: Multifocal ground-glass opacities throughout the bilateral lung bases, right greater than left. Emphysematous changes are seen in the right lung base. LIVER: There is an 8 mm hypodensity in the left lobe of the liver and an 18 mm hypodensity in the left lobe of the liver. These are new from prior. GALLBLADDER  AND BILE DUCTS: Gallbladder is surgically absent. No biliary ductal dilatation. SPLEEN: Spleen is markedly enlarged measuring up to 20.8 cm in length. PANCREAS: No acute abnormality. ADRENAL GLANDS: No acute abnormality. KIDNEYS, URETERS AND BLADDER: Punctate bilateral nonobstructing renal calculi. No hydronephrosis. Mild nonspecific bilateral perinephric fat stranding. Bladder is distended. There is mild diffuse bladder wall thickening. Calcifications are seen in the dependent portion of the bladder. GI AND BOWEL: Moderate-sized hiatal hernia. Stomach demonstrates no acute abnormality. The cecum is high riding. The appendix appears within normal limits. There is no bowel obstruction. PERITONEUM AND RETROPERITONEUM: No ascites. No free air. VASCULATURE: Aorta is normal in caliber. Atherosclerotic calcifications of the aorta. LYMPH NODES: There are numerous new retroperitoneal and upper abdominal enlarged lymph nodes. Enlarged cortical lymph node measuring 3.4 x 2.2 cm. Peritoneal lymph nodes have increased in size near the splenic hilum. Numerous nonenlarged and mildly enlarged mesenteric lymph nodes measuring up to 12 mm. Enlarged retroperitoneal lymph nodes diffusely measuring up to 12 mm. Enlarged left external iliac lymph nodes measured up to 15 mm. Enlarged right external iliac lymph nodes measuring up to 12 mm. Prominent bilateral inguinal lymph nodes. Enlarged gastropancreatic lymph node measuring 11 mm. REPRODUCTIVE ORGANS: Prostate gland is enlarged. BONES AND SOFT TISSUES: No acute osseous abnormality. No focal soft tissue abnormality. IMPRESSION: 1. New multistation lymphadenopathy involving the splenic hilum, upper abdomen retroperitoneum, external iliac chains, and inguinal regions. Findings are concerning for lymphoma or metastatic disease. 2. New Marked splenomegaly measuring up to 20.8 cm. 3. New 8 mm and 18 mm hypodensities in the left hepatic lobe. 4. Moderate hiatal hernia. Electronically signed  by: Greig Pique MD 01/06/2024 06:26 PM EDT RP Workstation: [id number]    ASSESSMENT & PLAN:    83 y.o. 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 FISH TP53 wild type.  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 PLAN: - Reviewed 01/30/2024 Surgical Pathology:  Morphologic evaluation of the needle core biopsy reveals a lymph node  with effaced architecture comprised predominantly of medium sized lymphocytes intermixed with larger, more atypical cells. Immunohistochemically the neoplastic cells are positive for CD20, Bcl-2 and show CD5 coexpression.  The cells are negative for CD23.  Cyclin D1 staining is equivocal.  The Ki-67 proliferation index appears somewhat elevated at 30%.  CD10 and BCL6 highlight residual germinal centers. Flow cytometric analysis revealed a clonal B-cell population constituting 56% of the lymphocytes.  The lymphocytes are positive for CD5, CD19, CD20, CD38 and show kappa light chain restriction.   Overall, the findings are consistent with involvement by a B-cell lymphoma. Diagnostic considerations include atypical chronic lymphocytic leukemia/small lymphocytic lymphoma and mantle cell lymphoma.          Discussed in details with Pathologist Dr Ilsa Pottier -- final diagnosis consistent with Cyclin D1 neg Mantle cell lymphoma.   - Reviewed 01/06/2024 CT A/P:  1. New multistation lymphadenopathy involving the splenic hilum, upper abdomen retroperitoneum, external iliac chains, and inguinal regions. Findings are concerning for lymphoma or metastatic disease.  2. New Marked splenomegaly measuring up to 20.8 cm.  3. New 8 mm and 18 mm hypodensities in the left hepatic lobe.  4. Moderate hiatal hernia.   - Reviewed 01/14/2024 PET:  1. Extensive hypermetabolic lymphadenopathy involving the neck, chest, abdomen and pelvis.    Explained that his lymphoma would be considered stage 4, discussed how staging  different for lymphomas. 2. Massive splenomegaly with diffuse and marked hypermetabolism.    This should improve with treatment. 3. Diffuse hypermetabolic bone disease.  4. Diffuse hypermetabolic lesions in the thyroid  gland is likely lymphomatous involvement.  5. Overall findings are most consistent with lymphoma (Deauville 5).  6. Severe underlying lung disease with emphysema and pulmonary fibrosis. Patchy areas of parenchymal hypermetabolism likely inflammatory or possibly superimposed infection. Aortic Atherosclerosis (ICD10-I70.0) and Emphysema (ICD10-J43.9).   -Discussed recommended treatment for his Cyclin D1 neg Mantle cell lymphoma Patient was started on steroids (Prendisone) pending final pathology results since he was getting symptomatic from significant splenomegaly and extensive LNadenopathy. He notes improvement in symptoms with prednisone .  -Discussed recommended treatment of BR x 6 cycles followed by maintenance Rituxan +/- BTKi  - Answered questions about side effects of Rituximab.   He will go thorough an education session.  The first infusions may take 4-6 hours to monitor for reactions.   - Answered questions about side effects of Bendamustine.  - Answered questions about improving his symptoms of weight loss and fatigue.  First action will be treating his lymphoma.   FOLLOW-UP in  Chemo-counseling fro BR Scheduled to start BR from 02/26/2024 (orders are in)  The total time spent in the appointment was *** minutes* . All of the patient's questions were answered and the patient knows to call the clinic with any problems, questions, or concerns.  Emaline Saran MD MS AAHIVMS [facility] Hematology/Oncology Physician [facility]  *Total Encounter Time as defined by the Centers for Medicare and Medicaid Services includes, in addition to the face-to-face time of a patient visit (documented in the note above) non-face-to-face time: obtaining and reviewing outside  history, ordering and reviewing medications, tests or procedures, care coordination (communications with other health care professionals or caregivers) and documentation in the medical record.  I,Emily Lagle,acting as a neurosurgeon for Emaline Saran, MD.,have documented all relevant documentation on the behalf of Emaline Saran, MD,as directed by  Emaline Saran, MD while in the presence of Emaline Saran, MD.  I have reviewed the above documentation for accuracy  and completeness, and I agree with the above.  Aijalon Kirtz, MD

## 2024-02-18 NOTE — Progress Notes (Signed)
 Pharmacist Chemotherapy Monitoring - Initial Assessment    Anticipated start date: 02/26/2024   The following has been reviewed per standard work regarding the patient's treatment regimen: The patient's diagnosis, treatment plan and drug doses, and organ/hematologic function Lab orders and baseline tests specific to treatment regimen  The treatment plan start date, drug sequencing, and pre-medications Prior authorization status  Patient's documented medication list, including drug-drug interaction screen and prescriptions for anti-emetics and supportive care specific to the treatment regimen The drug concentrations, fluid compatibility, administration routes, and timing of the medications to be used The patient's access for treatment and lifetime cumulative dose history, if applicable  The patient's medication allergies and previous infusion related reactions, if applicable   Changes made to treatment plan:  N/A  Follow up needed:  N/A   Douglas [name], RPH, 02/18/2024  11:55 AM

## 2024-02-19 ENCOUNTER — Encounter: Payer: Self-pay | Admitting: Hematology

## 2024-02-19 ENCOUNTER — Encounter: Payer: Self-pay | Admitting: Internal Medicine

## 2024-02-26 ENCOUNTER — Encounter: Payer: Self-pay | Admitting: Hematology

## 2024-02-26 ENCOUNTER — Inpatient Hospital Stay: Admitting: Physician Assistant

## 2024-02-26 ENCOUNTER — Inpatient Hospital Stay: Attending: Physician Assistant

## 2024-02-26 ENCOUNTER — Inpatient Hospital Stay

## 2024-02-26 ENCOUNTER — Other Ambulatory Visit: Payer: Self-pay | Admitting: Physician Assistant

## 2024-02-26 VITALS — BP 119/58 | HR 68 | Resp 17

## 2024-02-26 VITALS — BP 118/64 | HR 99 | Temp 97.8°F | Resp 18 | Wt 143.5 lb

## 2024-02-26 DIAGNOSIS — M810 Age-related osteoporosis without current pathological fracture: Secondary | ICD-10-CM | POA: Diagnosis not present

## 2024-02-26 DIAGNOSIS — Z87891 Personal history of nicotine dependence: Secondary | ICD-10-CM | POA: Diagnosis not present

## 2024-02-26 DIAGNOSIS — Z7952 Long term (current) use of systemic steroids: Secondary | ICD-10-CM | POA: Diagnosis not present

## 2024-02-26 DIAGNOSIS — N182 Chronic kidney disease, stage 2 (mild): Secondary | ICD-10-CM | POA: Diagnosis not present

## 2024-02-26 DIAGNOSIS — C8318 Mantle cell lymphoma, lymph nodes of multiple sites: Secondary | ICD-10-CM

## 2024-02-26 DIAGNOSIS — D649 Anemia, unspecified: Secondary | ICD-10-CM | POA: Insufficient documentation

## 2024-02-26 DIAGNOSIS — Z5112 Encounter for antineoplastic immunotherapy: Secondary | ICD-10-CM | POA: Insufficient documentation

## 2024-02-26 DIAGNOSIS — Z5111 Encounter for antineoplastic chemotherapy: Secondary | ICD-10-CM | POA: Diagnosis present

## 2024-02-26 DIAGNOSIS — Z79624 Long term (current) use of inhibitors of nucleotide synthesis: Secondary | ICD-10-CM | POA: Diagnosis not present

## 2024-02-26 DIAGNOSIS — E785 Hyperlipidemia, unspecified: Secondary | ICD-10-CM | POA: Diagnosis not present

## 2024-02-26 DIAGNOSIS — G4730 Sleep apnea, unspecified: Secondary | ICD-10-CM | POA: Diagnosis not present

## 2024-02-26 DIAGNOSIS — Z9049 Acquired absence of other specified parts of digestive tract: Secondary | ICD-10-CM | POA: Insufficient documentation

## 2024-02-26 DIAGNOSIS — D696 Thrombocytopenia, unspecified: Secondary | ICD-10-CM | POA: Diagnosis not present

## 2024-02-26 DIAGNOSIS — Z79890 Hormone replacement therapy (postmenopausal): Secondary | ICD-10-CM | POA: Insufficient documentation

## 2024-02-26 DIAGNOSIS — I2510 Atherosclerotic heart disease of native coronary artery without angina pectoris: Secondary | ICD-10-CM | POA: Diagnosis not present

## 2024-02-26 DIAGNOSIS — E039 Hypothyroidism, unspecified: Secondary | ICD-10-CM | POA: Diagnosis not present

## 2024-02-26 DIAGNOSIS — Z79899 Other long term (current) drug therapy: Secondary | ICD-10-CM | POA: Insufficient documentation

## 2024-02-26 DIAGNOSIS — I129 Hypertensive chronic kidney disease with stage 1 through stage 4 chronic kidney disease, or unspecified chronic kidney disease: Secondary | ICD-10-CM | POA: Diagnosis not present

## 2024-02-26 LAB — CMP (CANCER CENTER ONLY)
ALT: 30 U/L (ref 0–44)
AST: 28 U/L (ref 15–41)
Albumin: 3.2 g/dL — ABNORMAL LOW (ref 3.5–5.0)
Alkaline Phosphatase: 92 U/L (ref 38–126)
Anion gap: 11 (ref 5–15)
BUN: 14 mg/dL (ref 8–23)
CO2: 25 mmol/L (ref 22–32)
Calcium: 8.8 mg/dL — ABNORMAL LOW (ref 8.9–10.3)
Chloride: 101 mmol/L (ref 98–111)
Creatinine: 0.99 mg/dL (ref 0.61–1.24)
GFR, Estimated: >60 mL/min (ref >60)
Glucose, Bld: 87 mg/dL (ref 70–99)
Potassium: 3.9 mmol/L (ref 3.5–5.1)
Sodium: 136 mmol/L (ref 135–145)
Total Bilirubin: 0.4 mg/dL (ref 0.0–1.2)
Total Protein: 6.8 g/dL (ref 6.5–8.1)

## 2024-02-26 LAB — CBC WITH DIFFERENTIAL (CANCER CENTER ONLY)
Abs Immature Granulocytes: 0.01 K/uL (ref 0.00–0.07)
Basophils Absolute: 0 K/uL (ref 0.0–0.1)
Basophils Relative: 1 %
Eosinophils Absolute: 0.2 K/uL (ref 0.0–0.5)
Eosinophils Relative: 3 %
HCT: 31.4 % — ABNORMAL LOW (ref 39.0–52.0)
Hemoglobin: 9.8 g/dL — ABNORMAL LOW (ref 13.0–17.0)
Immature Granulocytes: 0 %
Lymphocytes Relative: 31 %
Lymphs Abs: 1.9 K/uL (ref 0.7–4.0)
MCH: 29.7 pg (ref 26.0–34.0)
MCHC: 31.2 g/dL (ref 30.0–36.0)
MCV: 95.2 fL (ref 80.0–100.0)
Monocytes Absolute: 0.6 K/uL (ref 0.1–1.0)
Monocytes Relative: 10 %
Neutro Abs: 3.5 K/uL (ref 1.7–7.7)
Neutrophils Relative %: 55 %
Platelet Count: 33 K/uL — ABNORMAL LOW (ref 150–400)
RBC: 3.3 MIL/uL — ABNORMAL LOW (ref 4.22–5.81)
Smear Review: NORMAL
WBC Count: 6.2 K/uL (ref 4.0–10.5)
nRBC: 0 % (ref 0.0–0.2)

## 2024-02-26 MED ORDER — SODIUM CHLORIDE 0.9 % IV SOLN: INTRAVENOUS | Status: DC

## 2024-02-26 MED ORDER — FAMOTIDINE IN NACL 20-0.9 MG/50ML-% IV SOLN
20.0000 mg | Freq: Once | INTRAVENOUS | Status: AC | PRN
  Administered 2024-02-26: 20 mg via INTRAVENOUS

## 2024-02-26 MED ORDER — METHYLPREDNISOLONE SODIUM SUCC 125 MG IJ SOLR
125.0000 mg | Freq: Once | INTRAMUSCULAR | Status: AC | PRN
  Administered 2024-02-26: 125 mg via INTRAVENOUS

## 2024-02-26 MED ORDER — SODIUM CHLORIDE 0.9 % IV SOLN
375.0000 mg/m2 | Freq: Once | INTRAVENOUS | Status: AC
  Administered 2024-02-26: 700 mg via INTRAVENOUS
  Filled 2024-02-26: qty 50

## 2024-02-26 MED ORDER — ACETAMINOPHEN 325 MG PO TABS
650.0000 mg | ORAL_TABLET | Freq: Once | ORAL | Status: AC
  Administered 2024-02-26: 650 mg via ORAL
  Filled 2024-02-26: qty 2

## 2024-02-26 MED ORDER — MORPHINE SULFATE (PF) 2 MG/ML IV SOLN
2.0000 mg | Freq: Once | INTRAVENOUS | Status: AC
  Administered 2024-02-26: 2 mg via INTRAVENOUS

## 2024-02-26 MED ORDER — DIPHENHYDRAMINE HCL 25 MG PO CAPS
50.0000 mg | ORAL_CAPSULE | Freq: Once | ORAL | Status: AC
  Administered 2024-02-26: 50 mg via ORAL
  Filled 2024-02-26: qty 2

## 2024-02-26 MED ORDER — SODIUM CHLORIDE 0.9 % IV SOLN: Freq: Once | INTRAVENOUS | Status: DC | PRN

## 2024-02-26 NOTE — Patient Instructions (Signed)
 [facility] - A DEPT OF [facility]  Discharge Instructions: Thank you for choosing [facility] to provide your oncology and hematology care.   If you have a lab appointment with the Cancer Center, please go directly to the Cancer Center and check in at the registration area.   Wear comfortable clothing and clothing appropriate for easy access to any Portacath or PICC line.   We strive to give you quality time with your provider. You may need to reschedule your appointment if you arrive late (15 or more minutes).  Arriving late affects you and other patients whose appointments are after yours.  Also, if you miss three or more appointments without notifying the office, you may be dismissed from the clinic at the provider's discretion.      For prescription refill requests, have your pharmacy contact our office and allow 72 hours for refills to be completed.    Today you received the following chemotherapy and/or immunotherapy agents Rituximab-pvvr      To help prevent nausea and vomiting after your treatment, we encourage you to take your nausea medication as directed.  BELOW ARE SYMPTOMS THAT SHOULD BE REPORTED IMMEDIATELY: *FEVER GREATER THAN 100.4 F (38 C) OR HIGHER *CHILLS OR SWEATING *NAUSEA AND VOMITING THAT IS NOT CONTROLLED WITH YOUR NAUSEA MEDICATION *UNUSUAL SHORTNESS OF BREATH *UNUSUAL BRUISING OR BLEEDING *URINARY PROBLEMS (pain or burning when urinating, or frequent urination) *BOWEL PROBLEMS (unusual diarrhea, constipation, pain near the anus) TENDERNESS IN MOUTH AND THROAT WITH OR WITHOUT PRESENCE OF ULCERS (sore throat, sores in mouth, or a toothache) UNUSUAL RASH, SWELLING OR PAIN  UNUSUAL VAGINAL DISCHARGE OR ITCHING   Items with * indicate a potential emergency and should be followed up as soon as possible or go to the Emergency Department if any problems should occur.  Please show the CHEMOTHERAPY ALERT CARD or  IMMUNOTHERAPY ALERT CARD at check-in to the Emergency Department and triage nurse.  Should you have questions after your visit or need to cancel or reschedule your appointment, please contact [facility] - A DEPT OF [facility]  Dept: [phone]  and follow the prompts.  Office hours are 8:00 a.m. to 4:30 p.m. Monday - Friday. Please note that voicemails left after 4:00 p.m. may not be returned until the following business day.  We are closed weekends and major holidays. You have access to a nurse at all times for urgent questions. Please call the main number to the clinic Dept: [phone] and follow the prompts.   For any non-urgent questions, you may also contact your provider using MyChart. We now offer e-Visits for anyone 20 and older to request care online for non-urgent symptoms. For details visit [email or web address].   Also download the MyChart app! Go to the app store, search MyChart, open the app, select [facility], and log in with your MyChart username and password.  Rituximab Injection What is this medication? RITUXIMAB (ri TUX i mab) treats leukemia and lymphoma. It works by blocking a protein that causes cancer cells to grow and multiply. This helps to slow or stop the spread of cancer cells. It may also be used to treat autoimmune conditions, such as arthritis. It works by slowing down an overactive immune system. It is a monoclonal antibody. This medicine may be used for other purposes; ask your health care provider or pharmacist if you have questions. COMMON BRAND NAME(S): RIABNI, Rituxan, RUXIENCE, truxima  What should I tell my care team before I take this medication? They need to know if you have any of these conditions: Chest pain Heart disease Immune system problems Infection, such as chickenpox, cold sores, hepatitis B, herpes Irregular heartbeat or rhythm Kidney disease Low blood counts, such as low white cells, platelets, red  cells Lung disease Recent or upcoming vaccine An unusual or allergic reaction to rituximab, other medications, foods, dyes, or preservatives Pregnant or trying to get pregnant Breast-feeding How should I use this medication? This medication is injected into a vein. It is given by a care team in a hospital or clinic setting. A special MedGuide will be given to you before each treatment. Be sure to read this information carefully each time. Talk to your care team about the use of this medication in children. While this medication may be prescribed for children as young as 6 months for selected conditions, precautions do apply. Overdosage: If you think you have taken too much of this medicine contact a poison control center or emergency room at once. NOTE: This medicine is only for you. Do not share this medicine with others. What if I miss a dose? Keep appointments for follow-up doses. It is important not to miss your dose. Call your care team if you are unable to keep an appointment. What may interact with this medication? Do not take this medication with any of the following: Live vaccines This medication may also interact with the following: Cisplatin This list may not describe all possible interactions. Give your health care provider a list of all the medicines, herbs, non-prescription drugs, or dietary supplements you use. Also tell them if you smoke, drink alcohol , or use illegal drugs. Some items may interact with your medicine. What should I watch for while using this medication? Your condition will be monitored carefully while you are receiving this medication. You may need blood work while taking this medication. This medication can cause serious infusion reactions. To reduce the risk your care team may give you other medications to take before receiving this one. Be sure to follow the directions from your care team. This medication may increase your risk of getting an infection. Call  your care team for advice if you get a fever, chills, sore throat, or other symptoms of a cold or flu. Do not treat yourself. Try to avoid being around people who are sick. Call your care team if you are around anyone with measles, chickenpox, or if you develop sores or blisters that do not heal properly. Avoid taking medications that contain aspirin, acetaminophen , ibuprofen, naproxen, or ketoprofen unless instructed by your care team. These medications may hide a fever. This medication may cause serious skin reactions. They can happen weeks to months after starting the medication. Contact your care team right away if you notice fevers or flu-like symptoms with a rash. The rash may be red or purple and then turn into blisters or peeling of the skin. You may also notice a red rash with swelling of the face, lips, or lymph nodes in your neck or under your arms. In some patients, this medication may cause a serious brain infection that may cause death. If you have any problems seeing, thinking, speaking, walking, or standing, tell your care team right away. If you cannot reach your care team, urgently seek another source of medical care. Talk to your care team if you may be pregnant. Serious birth defects can occur if you take this medication during pregnancy  and for 12 months after the last dose. You will need a negative pregnancy test before starting this medication. Contraception is recommended while taking this medication and for 12 months after the last dose. Your care team can help you find the option that works for you. Do not breastfeed while taking this medication and for at least 6 months after the last dose. What side effects may I notice from receiving this medication? Side effects that you should report to your care team as soon as possible: Allergic reactions or angioedema--skin rash, itching or hives, swelling of the face, eyes, lips, tongue, arms, or legs, trouble swallowing or  breathing Bowel blockage--stomach cramping, unable to have a bowel movement or pass gas, loss of appetite, vomiting Dizziness, loss of balance or coordination, confusion or trouble speaking Heart attack--pain or tightness in the chest, shoulders, arms, or jaw, nausea, shortness of breath, cold or clammy skin, feeling faint or lightheaded Heart rhythm changes--fast or irregular heartbeat, dizziness, feeling faint or lightheaded, chest pain, trouble breathing Infection--fever, chills, cough, sore throat, wounds that don't heal, pain or trouble when passing urine, general feeling of discomfort or being unwell Infusion reactions--chest pain, shortness of breath or trouble breathing, feeling faint or lightheaded Kidney injury--decrease in the amount of urine, swelling of the ankles, hands, or feet Liver injury--right upper belly pain, loss of appetite, nausea, light-colored stool, dark yellow or brown urine, yellowing skin or eyes, unusual weakness or fatigue Redness, blistering, peeling, or loosening of the skin, including inside the mouth Stomach pain that is severe, does not go away, or gets worse Tumor lysis syndrome (TLS)--nausea, vomiting, diarrhea, decrease in the amount of urine, dark urine, unusual weakness or fatigue, confusion, muscle pain or cramps, fast or irregular heartbeat, joint pain Side effects that usually do not require medical attention (report to your care team if they continue or are bothersome): Headache Joint pain Nausea Runny or stuffy nose Unusual weakness or fatigue This list may not describe all possible side effects. Call your doctor for medical advice about side effects. You may report side effects to FDA at 1-800-FDA-1088. Where should I keep my medication? This medication is given in a hospital or clinic. It will not be stored at home. NOTE: This sheet is a summary. It may not cover all possible information. If you have questions about this medicine, talk to your  doctor, pharmacist, or health care provider.  2024 Elsevier/Gold Standard (2021-08-02 00:00:00)

## 2024-02-26 NOTE — Progress Notes (Signed)
 Due to drop in platelet count of 33K, discussed with Dr. Onesimo who recommends to proceed with rituximab only today. We will defer R-Benda by one week with possible addditon of Nplate if needed.

## 2024-02-26 NOTE — Progress Notes (Unsigned)
    DATE:  02/26/24                                        X CHEMO/IMMUNOTHERAPY REACTION             MD: Onesimo   AGENT/BLOOD PRODUCT RECEIVING TODAY:              Rituxan    AGENT/BLOOD PRODUCT RECEIVING IMMEDIATELY PRIOR TO REACTION:          Rituxan     Vitals:   02/26/24 1317 02/26/24 1326  BP: 130/63 (!) 119/58  Pulse: 70 68  Resp: 17 17  SpO2: 95% 96%      REACTION(S):           flushing, rigors   PREMEDS:     Benadryl  50 mg PO, Tylenol  650 mg PO, morphine  2 mg IV   INTERVENTION: Pepcid  20 mg IV, 62 mg IV solu-medrol    Review of Systems  Review of Systems  Skin:  Positive for color change.  Neurological:  Positive for tremors.  All other systems reviewed and are negative.    Physical Exam  Physical Exam Vitals and nursing note reviewed.  Constitutional:      Appearance: He is not ill-appearing or toxic-appearing.     Comments: Facial flushing. Rigors  HENT:     Head: Normocephalic.  Eyes:     Conjunctiva/sclera: Conjunctivae normal.  Cardiovascular:     Rate and Rhythm: Normal rate and regular rhythm.     Pulses: Normal pulses.     Heart sounds: Normal heart sounds.  Pulmonary:     Effort: Pulmonary effort is normal.     Breath sounds: Normal breath sounds.  Abdominal:     General: There is no distension.  Musculoskeletal:     Cervical back: Normal range of motion.  Skin:    General: Skin is warm and dry.  Neurological:     Mental Status: He is alert.     OUTCOME:                  Patient became symptomatic near end of first bump of during first time Rituxan  infusion. Emergency medications were administered as documented above. Patient returned to baseline. Oncologist will be notified. Patient tolerated remainder of treatment. Will add pepcid  as pre medication for next treatment.  I have spent a total of 20 minutes minutes of face-to-face and non-face-to-face time preparing to see the patient, performing a medically appropriate examination,  counseling and educating the patient, ordering medications, documenting clinical information in the electronic health record, and care coordination.

## 2024-02-26 NOTE — Progress Notes (Signed)
 Hypersensitivity Reaction note  Date of event: 02/26/24 Time of event: 1317 Generic name of drug involved: Rituximab Name of provider notified of the hypersensitivity reaction: Mallie Combes, PA; Johnston Police, PA Was agent that likely caused hypersensitivity reaction added to Allergies List within EMR? Yes Chain of events including reaction signs/symptoms, treatment administered, and outcome (e.g., drug resumed; drug discontinued; sent to Emergency Department; etc.)  1317: Patient c/o face feeling hot; displayed facial flushing.  Infusion stopped; IVF hung to gravity.  Mallie Combes, PA, contacted, en route.  See Flowsheets for VS.  See MAR for medication details.  1322: IV Pepcid  given per PA, at chairside.  See Flowsheets for vitals.  See MAR for med details.  1338: Patient reported lessening of facial flushing.  See Flowsheets for VS.  62 mg Solu-Medrol  given per PA.  See MAR for details.  1346: Patient began experienced rigors.  2 mg IV morphine given.  See MAR for details.  1408: Patient continued to experience rigors.  Additional 62 mg of Solu-Medrol  given.  1426: Patient reported lessening of symptoms.  See Flowsheets for VS.  1435: Patient reported return to baseline.  Infusion restarted at initial rate per PA.  Pepcid  added as premedication for next infusion.  Katrinka DELENA Converse, RN 02/26/2024 2:46 PM

## 2024-02-27 ENCOUNTER — Encounter: Payer: Self-pay | Admitting: Internal Medicine

## 2024-02-27 ENCOUNTER — Encounter: Payer: Self-pay | Admitting: Hematology

## 2024-02-27 ENCOUNTER — Encounter: Payer: Self-pay | Admitting: Physician Assistant

## 2024-02-27 ENCOUNTER — Inpatient Hospital Stay

## 2024-03-02 ENCOUNTER — Inpatient Hospital Stay

## 2024-03-02 ENCOUNTER — Other Ambulatory Visit: Payer: Self-pay | Admitting: Hematology

## 2024-03-02 ENCOUNTER — Encounter: Payer: Self-pay | Admitting: Hematology

## 2024-03-02 VITALS — BP 141/69 | HR 97 | Temp 97.6°F | Resp 17 | Wt 144.5 lb

## 2024-03-02 DIAGNOSIS — C8318 Mantle cell lymphoma, lymph nodes of multiple sites: Secondary | ICD-10-CM

## 2024-03-02 DIAGNOSIS — Z5112 Encounter for antineoplastic immunotherapy: Secondary | ICD-10-CM | POA: Diagnosis not present

## 2024-03-02 LAB — CMP (CANCER CENTER ONLY)
ALT: 114 U/L — ABNORMAL HIGH (ref 0–44)
AST: 80 U/L — ABNORMAL HIGH (ref 15–41)
Albumin: 3.4 g/dL — ABNORMAL LOW (ref 3.5–5.0)
Alkaline Phosphatase: 212 U/L — ABNORMAL HIGH (ref 38–126)
Anion gap: 13 (ref 5–15)
BUN: 15 mg/dL (ref 8–23)
CO2: 22 mmol/L (ref 22–32)
Calcium: 9.1 mg/dL (ref 8.9–10.3)
Chloride: 100 mmol/L (ref 98–111)
Creatinine: 1.02 mg/dL (ref 0.61–1.24)
GFR, Estimated: >60 mL/min (ref >60)
Glucose, Bld: 135 mg/dL — ABNORMAL HIGH (ref 70–99)
Potassium: 4.1 mmol/L (ref 3.5–5.1)
Sodium: 135 mmol/L (ref 135–145)
Total Bilirubin: 0.6 mg/dL (ref 0.0–1.2)
Total Protein: 7.7 g/dL (ref 6.5–8.1)

## 2024-03-02 LAB — CBC WITH DIFFERENTIAL (CANCER CENTER ONLY)
Abs Immature Granulocytes: 0.12 K/uL — ABNORMAL HIGH (ref 0.00–0.07)
Basophils Absolute: 0.1 K/uL (ref 0.0–0.1)
Basophils Relative: 1 %
Eosinophils Absolute: 0.4 K/uL (ref 0.0–0.5)
Eosinophils Relative: 5 %
HCT: 32.7 % — ABNORMAL LOW (ref 39.0–52.0)
Hemoglobin: 10.2 g/dL — ABNORMAL LOW (ref 13.0–17.0)
Immature Granulocytes: 2 %
Lymphocytes Relative: 26 %
Lymphs Abs: 1.8 K/uL (ref 0.7–4.0)
MCH: 30.2 pg (ref 26.0–34.0)
MCHC: 31.2 g/dL (ref 30.0–36.0)
MCV: 96.7 fL (ref 80.0–100.0)
Monocytes Absolute: 0.7 K/uL (ref 0.1–1.0)
Monocytes Relative: 10 %
Neutro Abs: 3.7 K/uL (ref 1.7–7.7)
Neutrophils Relative %: 56 %
Platelet Count: 97 K/uL — ABNORMAL LOW (ref 150–400)
RBC: 3.38 MIL/uL — ABNORMAL LOW (ref 4.22–5.81)
WBC Count: 6.7 K/uL (ref 4.0–10.5)
nRBC: 0 % (ref 0.0–0.2)

## 2024-03-02 MED ORDER — DEXAMETHASONE SOD PHOSPHATE PF 10 MG/ML IJ SOLN
10.0000 mg | Freq: Once | INTRAMUSCULAR | Status: AC
  Administered 2024-03-02: 10 mg via INTRAVENOUS

## 2024-03-02 MED ORDER — SODIUM CHLORIDE 0.9 % IV SOLN
70.0000 mg/m2 | Freq: Once | INTRAVENOUS | Status: AC
  Administered 2024-03-02: 125 mg via INTRAVENOUS
  Filled 2024-03-02: qty 5

## 2024-03-02 MED ORDER — SODIUM CHLORIDE 0.9 % IV SOLN: INTRAVENOUS | Status: DC

## 2024-03-02 MED ORDER — PALONOSETRON HCL INJECTION 0.25 MG/5ML
0.2500 mg | Freq: Once | INTRAVENOUS | Status: AC
  Administered 2024-03-02: 0.25 mg via INTRAVENOUS
  Filled 2024-03-02: qty 5

## 2024-03-02 NOTE — Patient Instructions (Addendum)
 [facility] - A DEPT OF [facility]  Discharge Instructions: Thank you for choosing [facility] to provide your oncology and hematology care.   If you have a lab appointment with the Cancer Center, please go directly to the Cancer Center and check in at the registration area.   Wear comfortable clothing and clothing appropriate for easy access to any Portacath or PICC line.   We strive to give you quality time with your provider. You may need to reschedule your appointment if you arrive late (15 or more minutes).  Arriving late affects you and other patients whose appointments are after yours.  Also, if you miss three or more appointments without notifying the office, you may be dismissed from the clinic at the provider's discretion.      For prescription refill requests, have your pharmacy contact our office and allow 72 hours for refills to be completed.    Today you received the following chemotherapy and/or immunotherapy agents: Bendamustine       To help prevent nausea and vomiting after your treatment, we encourage you to take your nausea medication as directed.  BELOW ARE SYMPTOMS THAT SHOULD BE REPORTED IMMEDIATELY: *FEVER GREATER THAN 100.4 F (38 C) OR HIGHER *CHILLS OR SWEATING *NAUSEA AND VOMITING THAT IS NOT CONTROLLED WITH YOUR NAUSEA MEDICATION *UNUSUAL SHORTNESS OF BREATH *UNUSUAL BRUISING OR BLEEDING *URINARY PROBLEMS (pain or burning when urinating, or frequent urination) *BOWEL PROBLEMS (unusual diarrhea, constipation, pain near the anus) TENDERNESS IN MOUTH AND THROAT WITH OR WITHOUT PRESENCE OF ULCERS (sore throat, sores in mouth, or a toothache) UNUSUAL RASH, SWELLING OR PAIN  UNUSUAL VAGINAL DISCHARGE OR ITCHING   Items with * indicate a potential emergency and should be followed up as soon as possible or go to the Emergency Department if any problems should occur.  Please show the CHEMOTHERAPY ALERT CARD or  IMMUNOTHERAPY ALERT CARD at check-in to the Emergency Department and triage nurse.  Should you have questions after your visit or need to cancel or reschedule your appointment, please contact [facility] - A DEPT OF [facility]  Dept: [phone]  and follow the prompts.  Office hours are 8:00 a.m. to 4:30 p.m. Monday - Friday. Please note that voicemails left after 4:00 p.m. may not be returned until the following business day.  We are closed weekends and major holidays. You have access to a nurse at all times for urgent questions. Please call the main number to the clinic Dept: [phone] and follow the prompts.   For any non-urgent questions, you may also contact your provider using MyChart. We now offer e-Visits for anyone 9 and older to request care online for non-urgent symptoms. For details visit [email or web address].   Also download the MyChart app! Go to the app store, search MyChart, open the app, select [place], and log in with your MyChart username and password.  Bendamustine  Injection What is this medication? BENDAMUSTINE  (BEN da MUS teen) treats leukemia and lymphoma. It works by slowing down the growth of cancer cells. This medicine may be used for other purposes; ask your health care provider or pharmacist if you have questions. COMMON BRAND NAME(S): BELRAPZO , BENDEKA , Treanda , VIVIMUSTA  What should I tell my care team before I take this medication? They need to know if you have any of these conditions: Infection, especially a viral infection, such as chickenpox, cold sores, herpes Kidney disease Liver disease An unusual or  allergic reaction to bendamustine , mannitol, other medications, foods, dyes, or preservatives Pregnant or trying to get pregnant Breast-feeding How should I use this medication? This medication is injected into a vein. It is given by your care team in a hospital or clinic setting. Talk to your care team about the  use of this medication in children. Special care may be needed. Overdosage: If you think you have taken too much of this medicine contact a poison control center or emergency room at once. NOTE: This medicine is only for you. Do not share this medicine with others. What if I miss a dose? Keep appointments for follow-up doses. It is important not to miss your dose. Call your care team if you are unable to keep an appointment. What may interact with this medication? Do not take this medication with any of the following: Clozapine This medication may also interact with the following: Atazanavir Cimetidine Ciprofloxacin Enoxacin Fluvoxamine Medications for seizures, such as carbamazepine, phenobarbital Mexiletine Rifampin Tacrine Thiabendazole Zileuton This list may not describe all possible interactions. Give your health care provider a list of all the medicines, herbs, non-prescription drugs, or dietary supplements you use. Also tell them if you smoke, drink alcohol , or use illegal drugs. Some items may interact with your medicine. What should I watch for while using this medication? Visit your care team for regular checks on your progress. This medication may make you feel generally unwell. This is not uncommon, as chemotherapy can affect healthy cells as well as cancer cells. Report any side effects. Continue your course of treatment even though you feel ill unless your care team tells you to stop. You may need blood work while taking this medication. This medication may increase your risk of getting an infection. Call your care team for advice if you get a fever, chills, sore throat, or other symptoms of a cold or flu. Do not treat yourself. Try to avoid being around people who are sick. This medication may cause serious skin reactions. They can happen weeks to months after starting the medication. Contact your care team right away if you notice fevers or flu-like symptoms with a rash. The  rash may be red or purple and then turn into blisters or peeling of the skin. You may also notice a red rash with swelling of the face, lips, or lymph nodes in your neck or under your arms. In some patients, this medication may cause a serious brain infection that may cause death. If you have any problems seeing, thinking, speaking, walking, or standing, tell your care team right away. If you cannot reach your care team, urgently seek other source of medical care. This medication may increase your risk to bruise or bleed. Call your care team if you notice any unusual bleeding. Talk to your care team about your risk of cancer. You may be more at risk for certain types of cancer if you take this medication. Talk to your care team about your risk of skin cancer. You may be more at risk for skin cancer if you take this medication. Talk to your care team if you or your partner wish to become pregnant or think either of you might be pregnant. This medication can cause serious birth defects if taken during pregnancy or for up to 6 months after the last dose. A negative pregnancy test is required before starting this medication. A reliable form of contraception is recommended while taking this medication and for 6 months after the last dose. Talk  to your care team about reliable forms of contraception. Wear a condom while taking this medication and for at least 3 months after the last dose. Do not breast-feed while taking this medication or for at least 1 week after the last dose. This medication may cause infertility. Talk to your care team if you are concerned about your fertility. What side effects may I notice from receiving this medication? Side effects that you should report to your care team as soon as possible: Allergic reactions--skin rash, itching, hives, swelling of the face, lips, tongue, or throat Infection--fever, chills, cough, sore throat, wounds that don't heal, pain or trouble when passing  urine, general feeling of discomfort or being unwell Infusion reactions--chest pain, shortness of breath or trouble breathing, feeling faint or lightheaded Liver injury--right upper belly pain, loss of appetite, nausea, light-colored stool, dark yellow or brown urine, yellowing skin or eyes, unusual weakness or fatigue Low red blood cell level--unusual weakness or fatigue, dizziness, headache, trouble breathing Painful swelling, warmth, or redness of the skin, blisters or sores at the infusion site Rash, fever, and swollen lymph nodes Redness, blistering, peeling, or loosening of the skin, including inside the mouth Tumor lysis syndrome (TLS)--nausea, vomiting, diarrhea, decrease in the amount of urine, dark urine, unusual weakness or fatigue, confusion, muscle pain or cramps, fast or irregular heartbeat, joint pain Unusual bruising or bleeding Side effects that usually do not require medical attention (report to your care team if they continue or are bothersome): Diarrhea Fatigue Headache Loss of appetite Nausea Vomiting This list may not describe all possible side effects. Call your doctor for medical advice about side effects. You may report side effects to FDA at 1-800-FDA-1088. Where should I keep my medication? This medication is given in a hospital or clinic. It will not be stored at home. NOTE: This sheet is a summary. It may not cover all possible information. If you have questions about this medicine, talk to your doctor, pharmacist, or health care provider.  2024 Elsevier/Gold Standard (2021-07-03 00:00:00)

## 2024-03-02 NOTE — Progress Notes (Signed)
 No Nplate today per Irene, PA-C.  Harlene Nasuti, PharmD Oncology Infusion Pharmacist 03/02/2024 3:41 PM

## 2024-03-03 ENCOUNTER — Inpatient Hospital Stay

## 2024-03-03 VITALS — BP 115/68 | HR 69 | Temp 97.4°F | Resp 16 | Ht 69.0 in | Wt 145.0 lb

## 2024-03-03 DIAGNOSIS — Z5112 Encounter for antineoplastic immunotherapy: Secondary | ICD-10-CM | POA: Diagnosis not present

## 2024-03-03 DIAGNOSIS — C8318 Mantle cell lymphoma, lymph nodes of multiple sites: Secondary | ICD-10-CM

## 2024-03-03 MED ORDER — SODIUM CHLORIDE 0.9 % IV SOLN
70.0000 mg/m2 | Freq: Once | INTRAVENOUS | Status: AC
  Administered 2024-03-03: 125 mg via INTRAVENOUS
  Filled 2024-03-03: qty 5

## 2024-03-03 MED ORDER — SODIUM CHLORIDE 0.9 % IV SOLN: INTRAVENOUS | Status: DC

## 2024-03-03 MED ORDER — DEXAMETHASONE SOD PHOSPHATE PF 10 MG/ML IJ SOLN
10.0000 mg | Freq: Once | INTRAMUSCULAR | Status: AC
  Administered 2024-03-03: 10 mg via INTRAVENOUS

## 2024-03-03 NOTE — Patient Instructions (Signed)
 [facility] - A DEPT OF [facility]  Discharge Instructions: Thank you for choosing [facility] to provide your oncology and hematology care.   If you have a lab appointment with the Cancer Center, please go directly to the Cancer Center and check in at the registration area.   Wear comfortable clothing and clothing appropriate for easy access to any Portacath or PICC line.   We strive to give you quality time with your provider. You may need to reschedule your appointment if you arrive late (15 or more minutes).  Arriving late affects you and other patients whose appointments are after yours.  Also, if you miss three or more appointments without notifying the office, you may be dismissed from the clinic at the provider's discretion.      For prescription refill requests, have your pharmacy contact our office and allow 72 hours for refills to be completed.    Today you received the following chemotherapy and/or immunotherapy agents Bendamustine      To help prevent nausea and vomiting after your treatment, we encourage you to take your nausea medication as directed.  BELOW ARE SYMPTOMS THAT SHOULD BE REPORTED IMMEDIATELY: *FEVER GREATER THAN 100.4 F (38 C) OR HIGHER *CHILLS OR SWEATING *NAUSEA AND VOMITING THAT IS NOT CONTROLLED WITH YOUR NAUSEA MEDICATION *UNUSUAL SHORTNESS OF BREATH *UNUSUAL BRUISING OR BLEEDING *URINARY PROBLEMS (pain or burning when urinating, or frequent urination) *BOWEL PROBLEMS (unusual diarrhea, constipation, pain near the anus) TENDERNESS IN MOUTH AND THROAT WITH OR WITHOUT PRESENCE OF ULCERS (sore throat, sores in mouth, or a toothache) UNUSUAL RASH, SWELLING OR PAIN  UNUSUAL VAGINAL DISCHARGE OR ITCHING   Items with * indicate a potential emergency and should be followed up as soon as possible or go to the Emergency Department if any problems should occur.  Please show the CHEMOTHERAPY ALERT CARD or  IMMUNOTHERAPY ALERT CARD at check-in to the Emergency Department and triage nurse.  Should you have questions after your visit or need to cancel or reschedule your appointment, please contact [facility]  Dept: [phone]  and follow the prompts.  Office hours are 8:00 a.m. to 4:30 p.m. Monday - Friday. Please note that voicemails left after 4:00 p.m. may not be returned until the following business day.  We are closed weekends and major holidays. You have access to a nurse at all times for urgent questions. Please call the main number to the clinic Dept: [phone] and follow the prompts.   For any non-urgent questions, you may also contact your provider using MyChart. We now offer e-Visits for anyone 55 and older to request care online for non-urgent symptoms. For details visit [email or web address].   Also download the MyChart app! Go to the app store, search "MyChart", open the app, select [place], and log in with your MyChart username and password.

## 2024-03-04 ENCOUNTER — Telehealth: Payer: Self-pay

## 2024-03-04 NOTE — Telephone Encounter (Signed)
 Dr. Onesimo - first time Bendeka  f/u call - pt tolerated well Received: 2 days ago  Call patient [place] Katrinka LABOR, RN  P Onc Triage Nurse [facility]

## 2024-03-10 ENCOUNTER — Other Ambulatory Visit: Payer: Self-pay

## 2024-03-10 DIAGNOSIS — C8318 Mantle cell lymphoma, lymph nodes of multiple sites: Secondary | ICD-10-CM

## 2024-03-11 ENCOUNTER — Inpatient Hospital Stay (HOSPITAL_BASED_OUTPATIENT_CLINIC_OR_DEPARTMENT_OTHER): Admitting: Hematology

## 2024-03-11 ENCOUNTER — Inpatient Hospital Stay

## 2024-03-11 VITALS — BP 118/61 | HR 76 | Temp 97.7°F | Resp 18 | Wt 142.6 lb

## 2024-03-11 DIAGNOSIS — C8318 Mantle cell lymphoma, lymph nodes of multiple sites: Secondary | ICD-10-CM

## 2024-03-11 DIAGNOSIS — Z5112 Encounter for antineoplastic immunotherapy: Secondary | ICD-10-CM | POA: Diagnosis not present

## 2024-03-11 LAB — CBC WITH DIFFERENTIAL (CANCER CENTER ONLY)
Abs Immature Granulocytes: 0.04 K/uL (ref 0.00–0.07)
Basophils Absolute: 0 K/uL (ref 0.0–0.1)
Basophils Relative: 1 %
Eosinophils Absolute: 0.1 K/uL (ref 0.0–0.5)
Eosinophils Relative: 3 %
HCT: 32.2 % — ABNORMAL LOW (ref 39.0–52.0)
Hemoglobin: 10.3 g/dL — ABNORMAL LOW (ref 13.0–17.0)
Immature Granulocytes: 1 %
Lymphocytes Relative: 5 %
Lymphs Abs: 0.2 K/uL — ABNORMAL LOW (ref 0.7–4.0)
MCH: 31.2 pg (ref 26.0–34.0)
MCHC: 32 g/dL (ref 30.0–36.0)
MCV: 97.6 fL (ref 80.0–100.0)
Monocytes Absolute: 0.5 K/uL (ref 0.1–1.0)
Monocytes Relative: 14 %
Neutro Abs: 3 K/uL (ref 1.7–7.7)
Neutrophils Relative %: 76 %
Platelet Count: 137 K/uL — ABNORMAL LOW (ref 150–400)
RBC: 3.3 MIL/uL — ABNORMAL LOW (ref 4.22–5.81)
RDW: 23 % — ABNORMAL HIGH (ref 11.5–15.5)
WBC Count: 3.9 K/uL — ABNORMAL LOW (ref 4.0–10.5)
nRBC: 0 % (ref 0.0–0.2)

## 2024-03-11 LAB — CMP (CANCER CENTER ONLY)
ALT: 32 U/L (ref 0–44)
AST: 27 U/L (ref 15–41)
Albumin: 3.6 g/dL (ref 3.5–5.0)
Alkaline Phosphatase: 101 U/L (ref 38–126)
Anion gap: 9 (ref 5–15)
BUN: 16 mg/dL (ref 8–23)
CO2: 23 mmol/L (ref 22–32)
Calcium: 8.8 mg/dL — ABNORMAL LOW (ref 8.9–10.3)
Chloride: 102 mmol/L (ref 98–111)
Creatinine: 1.21 mg/dL (ref 0.61–1.24)
GFR, Estimated: >60 mL/min (ref >60)
Glucose, Bld: 131 mg/dL — ABNORMAL HIGH (ref 70–99)
Potassium: 4.5 mmol/L (ref 3.5–5.1)
Sodium: 133 mmol/L — ABNORMAL LOW (ref 135–145)
Total Bilirubin: 0.5 mg/dL (ref 0.0–1.2)
Total Protein: 7.2 g/dL (ref 6.5–8.1)

## 2024-03-11 NOTE — Progress Notes (Signed)
 Emaline Saran, MD HEMATOLOGY ONCOLOGY PROGRESS NOTE  Date of service: 03/11/2024 Patient Care Team: Shayne Anes, MD as PCP - General (Internal Medicine) Dann Candyce RAMAN, MD as PCP - Cardiology (Cardiology) Elana Montie CROME, RN as Oncology Nurse Navigator  CHIEF COMPLAINT/PURPOSE OF CONSULTATION: Follow-up for continued evaluation and management of Newly Diagnosed Stage IV Cyclin D1 neg Mantle Cell Lymphoma.   HISTORY OF PRESENTING ILLNESS:  (02/12/2024) Douglas [name] is a wonderful 83 y.o. male who has been referred to us  by Dr. Shayne Anes, MD for evaluation and management of evaluation of abnormal CT finding: new multistation lymphadenopathy suspicious for lymphoma vs malignancy, splenomegaly, hepatic hypodensities, and acute symptoms of abdominal pain. is ambulating with a wheelchair , accompanied by wife    He does endorse feeling fatigued, but this has been improved since starting steroids.    Additionally, he notes his pulmonary fibrosis symptoms have improved. This was diagnosed 4-5 years as idiopathic pulmonary fibrosis. He notes a history of smoking 1 PPD x 35 years. He has not had a lung biopsy, but many scans to confirm this diagnosis. He began Prednisone  60 mg on 02/04/2024.  He is still reportedly experiencing a fair amount of night sweats.   He denies having noticed any new lymph nodes, however did notice a lump in his abdomen. Denies abdominal pain or injuries.    SUMMARY OF ONCOLOGIC HISTORY: Oncology History  Mantle cell lymphoma of lymph nodes of multiple regions (HCC)  02/12/2024 Initial Diagnosis   Mantle cell lymphoma of lymph nodes of multiple regions (HCC)   02/26/2024 -  Chemotherapy   Patient is on Treatment Plan : NON-HODGKINS LYMPHOMA Rituximab  D1 + Bendamustine  D1,2 q28d x 6 cycles      02/26/2024 - First Cycle of Rituxan  with reaction of flushing and rigors. Returned to baseline following administration of emergency medication. Pepcid  added to  pre-medications.   INTERVAL HISTORY: Douglas [name] is a 83 y.o. male who is here today for continued evaluation and management of Stage IV Cyclin D1 neg Mantle Cell Lymphoma. is ambulating with a wheelchair , accompanied by wife   he was last seen by me on 02/12/2024.   Seen by Walisiewicz, Kaitlyn E, PA-C on 02/26/2024 following a medication reaction near the end of his first Rituxan  infusion.   Today, he reports the reaction he had following his first infusion of Rixtuxan. He tolerated Bendamustine  without any side effects. He notes some fatigue and constipation, but is eating well and increasing his hydration. Denies any abdominal pain or leg swelling. He does note some cramping in his fingers. He says that he has definitely noticed some improvement in his splenomegaly.   He reports he is up to date on RSV, Influenza, Shingrix, and PNA vaccinations. He has not updated his Covid-19 since 2024.   REVIEW OF SYSTEMS:   10 Point review of systems of done and is negative except as noted above.  MEDICAL HISTORY Past Medical History:  Diagnosis Date   Allergic rhinitis    Allergy     seasonal   Anal fissure    Bilateral inguinal hernia    Chest pain    Colon polyps    History of kidney stones    Hyperlipidemia    Hypertension    Hypogonadism in male    Hypothyroidism    Osteoporosis    Scrotal mass    Sebaceous cyst    Sleep apnea    CPAP q night - study done 8-10 yrs. ago  Testosterone  deficiency    injections q 3 week- July 8- last injection    Thyroid  disease    Vitamin D  deficiency    IMMUNIZATION HISTORY: Immunization History  Administered Date(s) Administered   PFIZER(Purple Top)SARS-COV-2 Vaccination 04/05/2019, 04/25/2019   Zoster Recombinant(Shingrix) 07/25/2017   SURGICAL HISTORY Past Surgical History:  Procedure Laterality Date   ANAL FISSURE REPAIR     CHOLECYSTECTOMY     COLONOSCOPY     FIBULA FRACTURE SURGERY     fibula and tibia fracture    FRACTURE  SURGERY     L leg -plate & screws - ORIF- fibula   HEMORRHOID SURGERY     HERNIA REPAIR  04/19/2011   bilateral   LAPAROSCOPIC INGUINAL HERNIA REPAIR  04/19/2011   bilateral   LAPAROSCOPY  10/11/2011   Procedure: LAPAROSCOPY DIAGNOSTIC;  Surgeon: Elspeth KYM Schultze, MD;  Location: [facility] OR;  Service: General;  Laterality: N/A;  UMBILICAL EXPLORATION   MASS EXCISION  10/11/2011   Procedure: EXCISION MASS;  Surgeon: Elspeth KYM Schultze, MD;  Location: [facility] OR;  Service: General;  Laterality: N/A;  EXCISION OF UMBILICAL MASS   POLYPECTOMY     right arm surgery      SENTINEL NODE BIOPSY Left 01/16/2024   Procedure: BIOPSY, LYMPH NODE, SENTINEL;  Surgeon: Lyndel Deward PARAS, MD;  Location: [facility];  Service: General;  Laterality: Left;  LEFT AXILLARY LYMPH NODE   UPPER GASTROINTESTINAL ENDOSCOPY      SOCIAL HISTORY Social History[1]  Social History   Social History Narrative   Not on file    SOCIAL DRIVERS OF HEALTH SDOH Screenings   Food Insecurity: No Food Insecurity (02/12/2024)  Housing: Low Risk (02/12/2024)  Transportation Needs: No Transportation Needs (02/12/2024)  Utilities: Not At Risk (02/12/2024)  Alcohol  Screen: Low Risk (09/10/2021)  Depression (PHQ2-9): Low Risk (02/26/2024)  Physical Activity: Sufficiently Active (09/10/2021)  Tobacco Use: Medium Risk (01/30/2024)     FAMILY HISTORY Family History  Problem Relation Age of Onset   Stroke Mother    Stroke Father    Colon cancer Neg Hx    Rectal cancer Neg Hx    Stomach cancer Neg Hx    Esophageal cancer Neg Hx    Colon polyps Neg Hx    Allergic rhinitis Neg Hx    Asthma Neg Hx    Angioedema Neg Hx    Atopy Neg Hx    Eczema Neg Hx    Immunodeficiency Neg Hx    Urticaria Neg Hx      ALLERGIES: is allergic to alpha-gal, rituximab , bovine (beef) protein-containing drug products, other, and porcine (pork) protein-containing drug products.  MEDICATIONS  Current Outpatient Medications  Medication Sig Dispense  Refill   acyclovir  (ZOVIRAX ) 400 MG tablet Take 1 tablet (400 mg total) by mouth daily. 30 tablet 3   allopurinol  (ZYLOPRIM ) 100 MG tablet Take 1 tablet (100 mg total) by mouth 2 (two) times daily. 60 tablet 0   cetirizine (ZYRTEC) 10 MG tablet Take 10 mg by mouth at bedtime.     denosumab  (PROLIA ) 60 MG/ML SOSY injection Inject 60 mg into the skin every 6 (six) months.     dexamethasone  (DECADRON ) 4 MG tablet Take 2 tablets (8 mg total) by mouth daily. Start the day after bendamustine  chemotherapy for 2 days. Take with food. 30 tablet 1   dorzolamide -timolol  (COSOPT ) 2-0.5 % ophthalmic solution Place 1 drop into the right eye 2 (two) times daily.     EPINEPHrine  0.3 mg/0.3 mL IJ SOAJ  injection Inject 0.3 mg into the muscle as needed for anaphylaxis.     levothyroxine  (SYNTHROID ) 125 MCG tablet Take 125 mcg by mouth every morning.     Magnesium 500 MG CAPS Take 500 mg by mouth at bedtime.     metroNIDAZOLE (METROGEL) 0.75 % gel Apply 1 Application topically in the morning and at bedtime.     Multiple Vitamin (MULTIVITAMIN WITH MINERALS) TABS tablet Take 1 tablet by mouth at bedtime.     ondansetron  (ZOFRAN ) 8 MG tablet Take 1 tablet (8 mg total) by mouth every 8 (eight) hours as needed for nausea or vomiting. Start on the third day after chemotherapy. 30 tablet 1   oxyCODONE -acetaminophen  (PERCOCET) 5-325 MG tablet Take 1 tablet by mouth every 4 (four) hours as needed for severe pain (pain score 7-10). 20 tablet 0   pantoprazole  (PROTONIX ) 40 MG tablet Take 40 mg by mouth every evening.     predniSONE  (DELTASONE ) 20 MG tablet Take 1 tablet (20 mg total) by mouth daily with breakfast. 7 tablet 0   prochlorperazine  (COMPAZINE ) 10 MG tablet Take 1 tablet (10 mg total) by mouth every 6 (six) hours as needed for nausea or vomiting. 30 tablet 1   simvastatin  (ZOCOR ) 40 MG tablet Take 40 mg by mouth at bedtime.     telmisartan (MICARDIS) 40 MG tablet Take 40 mg by mouth at bedtime.     testosterone   cypionate (DEPOTESTOSTERONE CYPIONATE) 200 MG/ML injection Inject 300 mg into the muscle every 21 ( twenty-one) days.     TRELEGY ELLIPTA 100-62.5-25 MCG/ACT AEPB Inhale 1 puff into the lungs in the morning.     No current facility-administered medications for this visit.    PHYSICAL EXAMINATION: ECOG PERFORMANCE STATUS: 1 - Symptomatic but completely ambulatory VITALS: Vitals:   03/11/24 1146  BP: 118/61  Pulse: 76  Resp: 18  Temp: 97.7 F (36.5 C)  SpO2: 96%   Filed Weights   03/11/24 1146  Weight: 142 lb 9.6 oz (64.7 kg)   Body mass index is 21.06 kg/m.  GENERAL: alert, in no acute distress and comfortable SKIN: no acute rashes, no significant lesions EYES: conjunctiva are pink and non-injected, sclera anicteric OROPHARYNX: MMM, no exudates, no oropharyngeal erythema or ulceration NECK: supple, no JVD LYMPH:  no palpable lymphadenopathy in the cervical, axillary or inguinal regions LUNGS: clear to auscultation b/l with normal respiratory effort HEART: regular rate & rhythm ABDOMEN:  normoactive bowel sounds , non tender, not distended, no hepatosplenomegaly Extremity: no pedal edema PSYCH: alert & oriented x 3 with fluent speech NEURO: no focal motor/sensory deficits  LABORATORY DATA:   I have reviewed the data as listed     Latest Ref Rng & Units 03/11/2024   10:29 AM 03/02/2024    1:20 PM 02/26/2024   10:01 AM  CBC EXTENDED  WBC 4.0 - 10.5 K/uL 3.9  6.7  6.2   RBC 4.22 - 5.81 MIL/uL 3.30  3.38  3.30   Hemoglobin 13.0 - 17.0 g/dL 89.6  89.7  9.8   HCT 60.9 - 52.0 % 32.2  32.7  31.4   Platelets 150 - 400 K/uL 137  97  33   NEUT# 1.7 - 7.7 K/uL 3.0  3.7  3.5   Lymph# 0.7 - 4.0 K/uL 0.2  1.8  1.9       Latest Ref Rng & Units 03/11/2024   10:29 AM 03/02/2024    1:20 PM 02/26/2024   10:01 AM  CMP  Glucose 70 -  99 mg/dL 868  864  87   BUN 8 - 23 mg/dL 16  15  14    Creatinine 0.61 - 1.24 mg/dL 8.78  8.97  9.00   Sodium 135 - 145 mmol/L 133  135  136    Potassium 3.5 - 5.1 mmol/L 4.5  4.1  3.9   Chloride 98 - 111 mmol/L 102  100  101   CO2 22 - 32 mmol/L 23  22  25    Calcium 8.9 - 10.3 mg/dL 8.8  9.1  8.8   Total Protein 6.5 - 8.1 g/dL 7.2  7.7  6.8   Total Bilirubin 0.0 - 1.2 mg/dL 0.5  0.6  0.4   Alkaline Phos 38 - 126 U/L 101  212  92   AST 15 - 41 U/L 27  80  28   ALT 0 - 44 U/L 32  114  30    02/13/2024 FISH   Surgical Pathology 01/30/2024 CASE: [id number] PATIENT: Helios Antone Bone Marrow Report  Reason for Addendum #1:  Immunohistochemistry results  Clinical History: Lymphadenopathy     DIAGNOSIS:  BONE MARROW, ASPIRATE, CLOT, CORE: - Hypercellular bone marrow (80%) involved by CD5 positive mature B-cell lymphoma (approximately 40% of cellularity).  See comment.  PERIPHERAL BLOOD: - Normocytic anemia - Thrombocytopenia  COMMENT: The patient's history of lymphadenopathy is noted.  The findings on the bone marrow specimen are similar to those on the lymph node specimen i.e. involvement by the patient's lymphoma to approximately 40% of bone marrow cellularity is observed.  Flow cytometric analysis performed on the bone marrow specimen identified a clonal B-cell population constituting 8% of all lymphocytes.  The cells are positive for CD5, CD19, CD20, CD38, and express kappa light chains.  Additional immunohistochemical stains CD23 and cyclin D1 are pending and will be reported in an addendum.  Correlation with pending cytogenetics, low-grade B-cell lymphoma FISH panel and SOX11 IHC is recommended for further assessment.  MICROSCOPIC DESCRIPTION:  PERIPHERAL BLOOD SMEAR: Platelets: Decreased, no platelet clumps identified Erythroid: Anisopoikilocytosis Leukocytes: Adequate, negative for dysplastic granulocytes or blasts  BONE MARROW ASPIRATE: Cellular Erythroid precursors: Erythroid precursors show a full sequence of maturation Granulocytic precursors: Granulocytic precursors show a full sequence  of maturation Megakaryocytes: Typical in number and morphology Lymphocytes/plasma cells: Mildly increased plasma cells  TOUCH PREPARATIONS: Cellular, confirmatory of aspirate findings  CLOT AND BIOPSY: The bone marrow clot shows predominantly blood and scattered marrow for evaluation.  The bone marrow biopsy reveals a hypercellular bone marrow (80%) with lymphoid aggregates comprised of medium sized atypical cells with convoluted nuclear membranes and prominent nucleoli.  The lymphoid aggregates constitute approximately 40% of bone marrow cellularity.  Background trilineage hematopoiesis is present.  SPECIAL STAINS: CD3: CD3 highlights T lymphocytes and the lymphoid aggregates CD20: CD20 highlights atypical B lymphocytes in the lymphoid aggregates CD5: CD5 highlights the atypical B lymphocytes in the lymphoid aggregates, as well as T lymphocytes Bcl-2: Bcl-2 highlights atypical B lymphocytes as well as T lymphocytes BCL6: BCL6 is negative CD138: CD138 highlights plasma cells MUM1: MUM1 highlights plasma cells Kappa/lambda ISH: Kappa/lambda ISH are polyclonal and the plasma cells Ki-67: Ki-67 appears mildly elevated to approximately 20%  IRON STAIN: Iron stains are performed on a bone marrow aspirate or touch imprint smear and section of clot. The controls stained appropriately.       Storage Iron: Scant      Ring Sideroblasts: Not identified  ADDITIONAL DATA/TESTING: Cytogenetics   CELL COUNT DATA:  Bone Marrow count performed  on 500 cells shows: Blasts:   5%   Myeloid:  58% Promyelocytes: 1%   Erythroid:     25% Myelocytes:    9%   Lymphocytes:   5% Metamyelocytes:     4%   Plasma cells:  11% Bands:    8% Neutrophils:   25%  M:E ratio:     2.32 Eosinophils:   6% Basophils:     0% Monocytes:     1%  Lab Data: CBC performed on 01/30/2024 shows: WBC: 8.7 k/uL  Neutrophils:   53% Hgb: 9.8 g/dL  Lymphocytes:   72% HCT: 31.8 %    Monocytes:     18% MCV: 83.7 fL    Eosinophils:   1% RDW: 17.3 %    Basophils:     1% PLT: 104 k/uL    GROSS DESCRIPTION:  A: Aspirate smear  B: The specimen is received in B plus fixative, and consists of a 12.0 x 7.0 x 2.0 mm aggregate of red-brown clotted blood.  The specimen is entirely submitted in 1 cassette.  C: The specimen is received in B plus fixative, and consists of 2 cores of red-brown bone, measuring 0.7 and 0.9 cm in length by 0.2 cm in diameter.  The specimen is entirely submitted in 1 cassette following decalcification with Immunocal.  (KL 01/30/2024)    ADDENDUM:   - Cyclin D1 and CD23 stains are negative in the lymphoid aggregates.  Controls are adequate.    ADDENDUM: - Immunohistochemical stains SOX11 performed at [facility] is positive in the neoplastic cells.  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     02/03/2024 Surgical Pathology CASE: [id number] PATIENT: ROSALYNN PIETY Flow Pathology Report     Clinical history: Lymphadenopathy     DIAGNOSIS:  - Abnormal clonal B-cell population identified.  See comment.  COMMENT: Flow cytometric analysis identified an abnormal clonal B-cell population constituting 56% of the lymphocytes.  The cells are positive for CD5, CD19, CD20, CD38 and express kappa light chains.  The findings are consistent with involvement by a CD5 positive mature B-cell lymphoma. Please refer to case [id number] for additional details.   GATING AND PHENOTYPIC ANALYSIS:  Gated population: Flow cytometric immunophenotyping is performed using antibodies to the antigens listed in the table below. Electronic gates are placed around a cell cluster displaying light scatter properties corresponding to: lymphocytes  Abnormal Cells in gated population: 56%  Phenotype of Abnormal Cells: CD5, CD19, CD20, CD38, Kappa                      Lymphoid Antigens       Myeloid Antigens Miscellaneous CD2  NEG  CD10 NEG  CD11b      ND   CD45 POS CD3  NEG  CD19 POS  CD11c     ND   HLA-Dr    ND CD4  NEG  CD20 POS  CD13 ND   CD34 NEG CD5  POS  CD22 ND   CD14 ND   CD38 NEG CD7  NEG  CD79b     ND   CD15 ND   CD138     ND CD8  NEG  CD103     ND   CD16 ND   TdT  ND CD25 ND   CD200     NEG  CD33 ND   CD123     ND TCRab     ND   sKappa  POS  CD64 ND   CD41 ND TCRgd     NEG  sLambda   NEG  CD117     ND   CD61 ND CD56 NEG  cKappa    ND   MPO  ND   CD71 ND CD57 ND   cLambda   ND        CD235aND      GROSS DESCRIPTION:  Reference Tissue case [id number].  01/30/2024 SURGICAL PATHOLOGY CASE: [id number] PATIENT: ROSALYNN PIETY Surgical Pathology Report     Clinical History: Possible lymphoma, lymphadenopathy, 1.6 cm (las)     FINAL MICROSCOPIC DIAGNOSIS:  A. LYMPH NODE, LEFT SUPRACLAVICULAR, BIOPSY: - CD5 positive mature B-cell lymphoma. See Comment.  COMMENT: - Morphologic evaluation of the needle core biopsy reveals a lymph node with effaced architecture comprised predominantly of medium sized lymphocytes intermixed with larger, more atypical cells. Immunohistochemically the neoplastic cells are positive for CD20, Bcl-2 and show CD5 coexpression.  The cells are negative for CD23.  Cyclin D1 staining is equivocal.  The Ki-67 proliferation index appears somewhat elevated at 30%.  CD10 and BCL6 highlight residual germinal centers. Flow cytometric analysis revealed a clonal B-cell population constituting 56% of the lymphocytes.  The lymphocytes are positive for CD5, CD19, CD20, CD38 and show kappa light chain restriction.  Overall, the findings are consistent with involvement by a B-cell lymphoma. Diagnostic considerations include atypical chronic lymphocytic leukemia/small lymphocytic lymphoma and mantle cell lymphoma.  For further characterization, a low-grade B-cell lymphoma FISH panel and SOX11 immunohistochemical stain will be performed and reported separately.  GROSS DESCRIPTION:  Specimen is  received fresh and consists of multiple core and core fragments of tan-white soft tissue, ranging from 0.1 cm to 1.4 cm in length by 0.1 cm in diameter.  Representative portion of tissue is placed in RPMI for possible flow cytometry, the remaining tissue is entirely submitted in 1 cassette.  SHIRLEEN 01/30/2024)      02/09/2024 Cytogenetics    RADIOGRAPHIC STUDIES: I have personally reviewed the radiological images as listed and agreed with the findings in the report. CT US  GUIDED BIOPSY Result Date: 01/30/2024 INDICATION: Diffuse lymphadenopathy and splenomegaly. Inconclusive pathology after excisional biopsy of a left axillary lymph node. The patient is at or ultrasound-guided biopsy of a hypermetabolic left supraclavicular lymph node. EXAM: ULTRASOUND GUIDED CORE BIOPSY OF LEFT SUPRACLAVICULAR LYMPH NODE MEDICATIONS: None. ANESTHESIA/SEDATION: The procedure immediately followed a bone marrow biopsy or which the patient received 1.5 mg IV versed  and 75 mcg IV fentanyl . PROCEDURE: The procedure, risks, benefits, and alternatives were explained to the patient. Questions regarding the procedure were encouraged and answered. The patient understands and consents to the procedure. A time out was performed prior to initiating the procedure. The left neck was prepped with chlorhexidine  in a sterile fashion, and a sterile drape was applied covering the operative field. A sterile gown and sterile gloves were used for the procedure. Local anesthesia was provided with 1% Lidocaine . After localizing an enlarged left supraclavicular lymph node of, 18 gauge core biopsy device was utilized in obtaining 4 separate core biopsy samples. Samples were submitted in saline. Additional ultrasound was performed. COMPLICATIONS: None immediate. FINDINGS: An enlarged hypoechoic lymph node in the left supraclavicular region posterior to the internal jugular vein and corresponding to a hypermetabolic lymph node seen by prior PET scan  measures approximately 1.6 x 1.2 x 1.0 cm by ultrasound. Solid core biopsy samples were obtained. IMPRESSION: Ultrasound-guided core biopsy performed of an enlarged left supraclavicular lymph node measuring  up to 1.6 cm by ultrasound. Electronically Signed   By: Marcey Moan M.D.   On: 01/30/2024 11:28   CT BONE MARROW BIOPSY & ASPIRATION Result Date: 01/30/2024 CLINICAL DATA:  Extensive lymphadenopathy and massive splenomegaly. Concern for lymphoma versus other lymphoproliferative process. Need for bone marrow biopsy. EXAM: CT GUIDED BONE MARROW ASPIRATION AND BIOPSY ANESTHESIA/SEDATION: Moderate (conscious) sedation was employed during this procedure. A total of Versed  1.5 mg and Fentanyl  75 mcg was administered intravenously. Moderate Sedation Time: 40 minutes which included bone marrow biopsy and under ultrasound guided lymph node biopsy. The patient's level of consciousness and vital signs were monitored continuously by radiology nursing throughout the procedure under my direct supervision. PROCEDURE: The procedure risks, benefits, and alternatives were explained to the patient. Questions regarding the procedure were encouraged and answered. The patient understands and consents to the procedure. A time out was performed prior to initiating the procedure. The right gluteal region was prepped with chlorhexidine . Sterile gown and sterile gloves were used for the procedure. Local anesthesia was provided with 1% Lidocaine . Under CT guidance, an 11 gauge On Control bone cutting needle was advanced from a posterior approach into the right iliac bone. Needle positioning was confirmed with CT. Initial non heparinized and heparinized aspirate samples were obtained of bone marrow. Core biopsy was performed via the On Control drill needle. COMPLICATIONS: None FINDINGS: Inspection of initial aspirate did reveal visible particles. Intact core biopsy sample was obtained. IMPRESSION: CT guided bone marrow biopsy of right  posterior iliac bone with both aspirate and core samples obtained. Electronically Signed   By: Marcey Moan M.D.   On: 01/30/2024 11:24   NM PET Image Initial (PI) Skull Base To Thigh (F-18 FDG) Result Date: 01/14/2024 CLINICAL DATA:  Initial treatment strategy for lymphadenopathy. EXAM: NUCLEAR MEDICINE PET SKULL BASE TO THIGH TECHNIQUE: 7.6 mCi F-18 FDG was injected intravenously. Full-ring PET imaging was performed from the skull base to thigh after the radiotracer. CT data was obtained and used for attenuation correction and anatomic localization. Fasting blood glucose: 72 mg/dl COMPARISON:  CT scan 89/85/7974 FINDINGS: Mediastinal blood pool activity: SUV max 1.4 Liver activity: SUV max 3.5 NECK: Small multistation hypermetabolic cervical lymph nodes, mainly in the lower neck and supraclavicular regions. SUV max ranges 3.6 to 4.1. There are also multiple hypermetabolic foci in both lobes of the thyroid  but no CT correlate. SUV max is 6.0 in the right upper anterior thyroid  lobe. Incidental CT findings: None CHEST: Numerous scattered hypermetabolic supraclavicular, axillary, mediastinal and hilar lymph nodes. Index left axillary node measures 8 mm on image 60/4. SUV max is 4.8. Right paratracheal node measures 13 mm on image 66/4 and SUV max is 9.8. Lymph node in the anterior mediastinum on the right side measures 11 mm and SUV max is 8.7. Subcarinal adenopathy has an SUV max of 9.6. Severe underlying lung disease with emphysema and pulmonary fibrosis. Patchy areas of parenchymal hypermetabolism likely inflammatory or possibly superimposed infection. Incidental CT findings: Atherosclerotic calcifications involving the aorta and coronary arteries and aortic valve. Moderate-sized hiatal hernia. ABDOMEN/PELVIS: Massive splenomegaly with diffuse and marked hypermetabolism highly suggestive of lymphoma. There is also an accessory spleen near the splenic hilum which is markedly hypermetabolic. No hepatic,  pancreatic, adrenal or renal lesions. Fairly extensive upper abdominal and retroperitoneal lymphadenopathy all demonstrating hypermetabolism. Index left para-aortic node on image 124/4 measures 16 mm and SUV max is 11.6. 10 mm para duodenal node on image 128/4 has an SUV max of 8.2. Pelvic sidewall  and external iliac adenopathy. Index external iliac node the left measures 10 mm on image 167/4 and SUV max is 8.0. Right external iliac node measures 9 mm and SUV max is 7.2. Small minimally hypermetabolic inguinal nodes. Incidental CT findings: Dependent layering bladder calculi noted. Advanced vascular calcifications are stable. Renal calculi are noted. Status post cholecystectomy. No biliary dilatation. SKELETON: Diffuse hypermetabolic bone disease involving the axial and appendicular skeleton. Sternal lesion has an SUV max of 5.6. Right humeral disease has an SUV max of 5.7. Diffuse spinal disease has an SUV max of 6.3. Diffuse pelvic disease has an SUV max of 6.0. Incidental CT findings: No visible lytic or sclerotic bone lesions are identified on the CT scan. IMPRESSION: 1. Extensive hypermetabolic lymphadenopathy involving the neck, chest, abdomen and pelvis. 2. Massive splenomegaly with diffuse and marked hypermetabolism. 3. Diffuse hypermetabolic bone disease. 4. Diffuse hypermetabolic lesions in the thyroid  gland is likely lymphomatous involvement. 5. Overall findings are most consistent with lymphoma ([place]auville 5). 6. Severe underlying lung disease with emphysema and pulmonary fibrosis. Patchy areas of parenchymal hypermetabolism likely inflammatory or possibly superimposed infection. Aortic Atherosclerosis (ICD10-I70.0) and Emphysema (ICD10-J43.9). Electronically Signed   By: MYRTIS Stammer M.D.   On: 01/14/2024 18:20   CT ABDOMEN PELVIS WO CONTRAST Result Date: 01/06/2024 EXAM: CT ABDOMEN AND PELVIS WITHOUT CONTRAST 01/06/2024 06:12:00 PM TECHNIQUE: CT of the abdomen and pelvis was performed without the  administration of intravenous contrast. Multiplanar reformatted images are provided for review. Automated exposure control, iterative reconstruction, and/or weight-based adjustment of the mA/kV was utilized to reduce the radiation dose to as low as reasonably achievable. COMPARISON: CT abdomen and pelvis 01/31/2019. CLINICAL HISTORY: Left lower quadrant abdominal pain, Unintended weight loss. FINDINGS: LOWER CHEST: Multifocal ground-glass opacities throughout the bilateral lung bases, right greater than left. Emphysematous changes are seen in the right lung base. LIVER: There is an 8 mm hypodensity in the left lobe of the liver and an 18 mm hypodensity in the left lobe of the liver. These are new from prior. GALLBLADDER AND BILE DUCTS: Gallbladder is surgically absent. No biliary ductal dilatation. SPLEEN: Spleen is markedly enlarged measuring up to 20.8 cm in length. PANCREAS: No acute abnormality. ADRENAL GLANDS: No acute abnormality. KIDNEYS, URETERS AND BLADDER: Punctate bilateral nonobstructing renal calculi. No hydronephrosis. Mild nonspecific bilateral perinephric fat stranding. Bladder is distended. There is mild diffuse bladder wall thickening. Calcifications are seen in the dependent portion of the bladder. GI AND BOWEL: Moderate-sized hiatal hernia. Stomach demonstrates no acute abnormality. The cecum is high riding. The appendix appears within normal limits. There is no bowel obstruction. PERITONEUM AND RETROPERITONEUM: No ascites. No free air. VASCULATURE: Aorta is normal in caliber. Atherosclerotic calcifications of the aorta. LYMPH NODES: There are numerous new retroperitoneal and upper abdominal enlarged lymph nodes. Enlarged cortical lymph node measuring 3.4 x 2.2 cm. Peritoneal lymph nodes have increased in size near the splenic hilum. Numerous nonenlarged and mildly enlarged mesenteric lymph nodes measuring up to 12 mm. Enlarged retroperitoneal lymph nodes diffusely measuring up to 12 mm. Enlarged  left external iliac lymph nodes measured up to 15 mm. Enlarged right external iliac lymph nodes measuring up to 12 mm. Prominent bilateral inguinal lymph nodes. Enlarged gastropancreatic lymph node measuring 11 mm. REPRODUCTIVE ORGANS: Prostate gland is enlarged. BONES AND SOFT TISSUES: No acute osseous abnormality. No focal soft tissue abnormality. IMPRESSION: 1. New multistation lymphadenopathy involving the splenic hilum, upper abdomen retroperitoneum, external iliac chains, and inguinal regions. Findings are concerning for lymphoma or metastatic  disease. 2. New Marked splenomegaly measuring up to 20.8 cm. 3. New 8 mm and 18 mm hypodensities in the left hepatic lobe. 4. Moderate hiatal hernia. Electronically signed by: Greig Pique MD 01/06/2024 06:26 PM EDT RP Workstation: [id number]    ASSESSMENT & PLAN:  83 y.o. 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 - FISH TP53 wild type.   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  PLAN: - Discussed lab results on 03/11/2024 in detail with patient: CBC showed WBC of 3.9K decreased from 6.7K, Hemoglobin of 10.3 increased from 10.2, and PLTs of 137K increased from 97K. CMP with Glucose 131 decreased from 135, Sodium 133 decreased from 135, Calcium 8.8 decreased from 9.1, and LFTs improved.   - Cramping could be related to hydration.   Stay well hydrated.   Drinks with caffeine, carbonation or sugar, and alcohol  do not contribute to hydration.  - Vaccine counseling: encourage updating Covid-19 immunization, preferably following his upcoming PET scan.  -Rituximab - reaction -- adjusted premedication.  FOLLOW-UP for cycle 2 of Bendamustine  Rituxan  as scheduled with labs and MD visit  The total time spent in the appointment was 30 minutes* .  All of the patient's questions were answered and the patient knows to call the clinic with any problems, questions, or concerns.  Emaline Saran MD MS AAHIVMS [facility] Hematology/Oncology Physician [facility]  *Total Encounter Time as defined by the Centers for Medicare and Medicaid Services includes, in addition to the face-to-face time of a patient visit (documented in the note above) non-face-to-face time: obtaining and reviewing outside history, ordering and reviewing medications, tests or procedures, care coordination (communications with other health care professionals or caregivers) and documentation in the medical record.  I,Emily Lagle,acting as a neurosurgeon for Emaline Saran, MD.,have documented all relevant documentation on the behalf of Emaline Saran, MD,as directed by  Emaline Saran, MD while in the presence of Emaline Saran, MD.  I have reviewed the above documentation for accuracy and completeness, and I agree with the above.  Emaline Saran, MD     [1]  Social History Tobacco Use   Smoking status: Former    Current packs/day: 0.00    Types: Cigarettes    Quit date: 06/05/1998    Years since quitting: 25.7   Smokeless tobacco: Never  Vaping Use   Vaping status: Never Used  Substance Use Topics   Alcohol  use: No   Drug use: No

## 2024-03-22 ENCOUNTER — Other Ambulatory Visit: Payer: Self-pay | Admitting: Hematology

## 2024-03-22 DIAGNOSIS — C8318 Mantle cell lymphoma, lymph nodes of multiple sites: Secondary | ICD-10-CM

## 2024-03-23 ENCOUNTER — Encounter: Payer: Self-pay | Admitting: Physician Assistant

## 2024-03-23 ENCOUNTER — Encounter: Payer: Self-pay | Admitting: Internal Medicine

## 2024-03-23 ENCOUNTER — Encounter: Payer: Self-pay | Admitting: Hematology

## 2024-03-31 ENCOUNTER — Other Ambulatory Visit: Payer: Self-pay

## 2024-03-31 ENCOUNTER — Inpatient Hospital Stay: Admitting: Dietician

## 2024-03-31 ENCOUNTER — Inpatient Hospital Stay

## 2024-03-31 ENCOUNTER — Inpatient Hospital Stay: Attending: Physician Assistant

## 2024-03-31 ENCOUNTER — Inpatient Hospital Stay: Admitting: Hematology

## 2024-03-31 VITALS — BP 117/70 | HR 82 | Temp 97.9°F | Resp 18 | Wt 145.3 lb

## 2024-03-31 VITALS — BP 103/65 | HR 74 | Temp 98.0°F | Resp 18

## 2024-03-31 DIAGNOSIS — D649 Anemia, unspecified: Secondary | ICD-10-CM | POA: Insufficient documentation

## 2024-03-31 DIAGNOSIS — Z5111 Encounter for antineoplastic chemotherapy: Secondary | ICD-10-CM | POA: Diagnosis present

## 2024-03-31 DIAGNOSIS — Z87891 Personal history of nicotine dependence: Secondary | ICD-10-CM | POA: Insufficient documentation

## 2024-03-31 DIAGNOSIS — C8318 Mantle cell lymphoma, lymph nodes of multiple sites: Secondary | ICD-10-CM

## 2024-03-31 DIAGNOSIS — Z5112 Encounter for antineoplastic immunotherapy: Secondary | ICD-10-CM | POA: Insufficient documentation

## 2024-03-31 DIAGNOSIS — R350 Frequency of micturition: Secondary | ICD-10-CM | POA: Diagnosis not present

## 2024-03-31 DIAGNOSIS — Z79899 Other long term (current) drug therapy: Secondary | ICD-10-CM | POA: Insufficient documentation

## 2024-03-31 DIAGNOSIS — D696 Thrombocytopenia, unspecified: Secondary | ICD-10-CM | POA: Insufficient documentation

## 2024-03-31 DIAGNOSIS — M810 Age-related osteoporosis without current pathological fracture: Secondary | ICD-10-CM | POA: Insufficient documentation

## 2024-03-31 LAB — CMP (CANCER CENTER ONLY)
ALT: 21 U/L (ref 0–44)
AST: 29 U/L (ref 15–41)
Albumin: 3.8 g/dL (ref 3.5–5.0)
Alkaline Phosphatase: 101 U/L (ref 38–126)
Anion gap: 10 (ref 5–15)
BUN: 16 mg/dL (ref 8–23)
CO2: 23 mmol/L (ref 22–32)
Calcium: 9.3 mg/dL (ref 8.9–10.3)
Chloride: 103 mmol/L (ref 98–111)
Creatinine: 1.15 mg/dL (ref 0.61–1.24)
GFR, Estimated: >60 mL/min
Glucose, Bld: 100 mg/dL — ABNORMAL HIGH (ref 70–99)
Potassium: 4.2 mmol/L (ref 3.5–5.1)
Sodium: 137 mmol/L (ref 135–145)
Total Bilirubin: 0.4 mg/dL (ref 0.0–1.2)
Total Protein: 7.1 g/dL (ref 6.5–8.1)

## 2024-03-31 LAB — CBC WITH DIFFERENTIAL (CANCER CENTER ONLY)
Abs Immature Granulocytes: 0.03 K/uL (ref 0.00–0.07)
Basophils Absolute: 0 K/uL (ref 0.0–0.1)
Basophils Relative: 1 %
Eosinophils Absolute: 0.2 K/uL (ref 0.0–0.5)
Eosinophils Relative: 3 %
HCT: 29.1 % — ABNORMAL LOW (ref 39.0–52.0)
Hemoglobin: 9.3 g/dL — ABNORMAL LOW (ref 13.0–17.0)
Immature Granulocytes: 1 %
Lymphocytes Relative: 14 %
Lymphs Abs: 0.9 K/uL (ref 0.7–4.0)
MCH: 31.1 pg (ref 26.0–34.0)
MCHC: 32 g/dL (ref 30.0–36.0)
MCV: 97.3 fL (ref 80.0–100.0)
Monocytes Absolute: 1 K/uL (ref 0.1–1.0)
Monocytes Relative: 15 %
Neutro Abs: 4.3 K/uL (ref 1.7–7.7)
Neutrophils Relative %: 66 %
Platelet Count: 204 K/uL (ref 150–400)
RBC: 2.99 MIL/uL — ABNORMAL LOW (ref 4.22–5.81)
RDW: 19.3 % — ABNORMAL HIGH (ref 11.5–15.5)
WBC Count: 6.5 K/uL (ref 4.0–10.5)
nRBC: 0 % (ref 0.0–0.2)

## 2024-03-31 LAB — URINALYSIS, COMPLETE (UACMP) WITH MICROSCOPIC
Bacteria, UA: NONE SEEN
Bilirubin Urine: NEGATIVE
Glucose, UA: NEGATIVE mg/dL
Hgb urine dipstick: NEGATIVE
Ketones, ur: NEGATIVE mg/dL
Leukocytes,Ua: NEGATIVE
Nitrite: NEGATIVE
Protein, ur: NEGATIVE mg/dL
Specific Gravity, Urine: 1.01 (ref 1.005–1.030)
pH: 6 (ref 5.0–8.0)

## 2024-03-31 MED ORDER — MORPHINE SULFATE (PF) 2 MG/ML IV SOLN
2.0000 mg | Freq: Once | INTRAVENOUS | Status: AC
  Administered 2024-03-31: 2 mg via INTRAVENOUS
  Filled 2024-03-31: qty 1

## 2024-03-31 MED ORDER — MONTELUKAST SODIUM 10 MG PO TABS
10.0000 mg | ORAL_TABLET | Freq: Once | ORAL | Status: AC
  Administered 2024-03-31: 10 mg via ORAL
  Filled 2024-03-31: qty 1

## 2024-03-31 MED ORDER — TAMSULOSIN HCL 0.4 MG PO CAPS: 0.4000 mg | ORAL_CAPSULE | Freq: Every day | ORAL | 2 refills | Status: AC

## 2024-03-31 MED ORDER — DIPHENHYDRAMINE HCL 25 MG PO CAPS
50.0000 mg | ORAL_CAPSULE | Freq: Once | ORAL | Status: AC
  Administered 2024-03-31: 50 mg via ORAL
  Filled 2024-03-31: qty 2

## 2024-03-31 MED ORDER — SODIUM CHLORIDE 0.9 % IV SOLN
375.0000 mg/m2 | Freq: Once | INTRAVENOUS | Status: AC
  Administered 2024-03-31: 700 mg via INTRAVENOUS
  Filled 2024-03-31: qty 20

## 2024-03-31 MED ORDER — FAMOTIDINE IN NACL 20-0.9 MG/50ML-% IV SOLN
20.0000 mg | Freq: Once | INTRAVENOUS | Status: AC
  Administered 2024-03-31: 20 mg via INTRAVENOUS
  Filled 2024-03-31: qty 50

## 2024-03-31 MED ORDER — METHYLPREDNISOLONE SODIUM SUCC 125 MG IJ SOLR
125.0000 mg | Freq: Once | INTRAMUSCULAR | Status: AC
  Administered 2024-03-31: 125 mg via INTRAVENOUS
  Filled 2024-03-31: qty 2

## 2024-03-31 MED ORDER — ACETAMINOPHEN 500 MG PO TABS
1000.0000 mg | ORAL_TABLET | Freq: Once | ORAL | Status: AC
  Administered 2024-03-31: 1000 mg via ORAL
  Filled 2024-03-31: qty 2

## 2024-03-31 MED ORDER — SODIUM CHLORIDE 0.9 % IV SOLN: INTRAVENOUS | Status: DC

## 2024-03-31 MED ORDER — PALONOSETRON HCL INJECTION 0.25 MG/5ML
0.2500 mg | Freq: Once | INTRAVENOUS | Status: AC
  Administered 2024-03-31: 0.25 mg via INTRAVENOUS
  Filled 2024-03-31: qty 5

## 2024-03-31 MED ORDER — SODIUM CHLORIDE 0.9 % IV SOLN
70.0000 mg/m2 | Freq: Once | INTRAVENOUS | Status: AC
  Administered 2024-03-31: 125 mg via INTRAVENOUS
  Filled 2024-03-31: qty 5

## 2024-03-31 NOTE — Progress Notes (Signed)
 " HEMATOLOGY ONCOLOGY PROGRESS NOTE  Date of service: 03/31/2024  Patient Care Team: Shayne Anes, MD as PCP - General (Internal Medicine) Dann Candyce RAMAN, MD as PCP - Cardiology (Cardiology) Elana Montie CROME, RN as Oncology Nurse Navigator  CHIEF COMPLAINT/PURPOSE OF CONSULTATION: Follow-up for continued evaluation and management of  Newly Diagnosed Stage IV Cyclin D1 neg Mantle Cell Lymphoma.   HISTORY OF PRESENTING ILLNESS: (02/12/2024) Douglas [name] is a wonderful 84 y.o. male who has been referred to us  by Dr. Shayne Anes, MD for evaluation and management of evaluation of abnormal CT finding: new multistation lymphadenopathy suspicious for lymphoma vs malignancy, splenomegaly, hepatic hypodensities, and acute symptoms of abdominal pain. is ambulating with a wheelchair , accompanied by wife    He does endorse feeling fatigued, but this has been improved since starting steroids.    Additionally, he notes his pulmonary fibrosis symptoms have improved. This was diagnosed 4-5 years as idiopathic pulmonary fibrosis. He notes a history of smoking 1 PPD x 35 years. He has not had a lung biopsy, but many scans to confirm this diagnosis. He began Prednisone  60 mg on 02/04/2024.  He is still reportedly experiencing a fair amount of night sweats.   He denies having noticed any new lymph nodes, however did notice a lump in his abdomen. Denies abdominal pain or injuries.    SUMMARY OF ONCOLOGIC HISTORY:    Oncology History  Mantle cell lymphoma of lymph nodes of multiple regions (HCC)  02/12/2024 Initial Diagnosis    Mantle cell lymphoma of lymph nodes of multiple regions (HCC)    02/26/2024 -  Chemotherapy    Patient is on Treatment Plan : NON-HODGKINS LYMPHOMA Rituximab  D1 + Bendamustine  D1,2 q28d x 6 cycles       02/26/2024 - First Cycle of Rituxan  with reaction of flushing and rigors. Returned to baseline following administration of emergency medication. Pepcid  added to  pre-medications.      SUMMARY OF ONCOLOGIC HISTORY: Oncology History  Mantle cell lymphoma of lymph nodes of multiple regions (HCC)  02/12/2024 Initial Diagnosis   Mantle cell lymphoma of lymph nodes of multiple regions (HCC)   02/26/2024 -  Chemotherapy   Patient is on Treatment Plan : NON-HODGKINS LYMPHOMA Rituximab  D1 + Bendamustine  D1,2 q28d x 6 cycles       INTERVAL HISTORY:  Douglas [name] is a 84 y.o. male who is here today for continued evaluation and management of  Newly Diagnosed Stage IV Cyclin D1 neg Mantle Cell Lymphoma. He is accompanied by his wife.   he was last seen by me on 03/11/2024; at the time he mentioned experiencing a reaction following his first Rituxan  infusion. He also noted fatigue and constipation.  Today, he states that he still experiences fatigue.   His wife notes that he has to get up during the night to use the bathroom. He notes that his urine stream is occasionally interrupted and that it does not always feel like he is fully emptying his bladder after each urination.  He has not seen a urologist.  He is still taking a testosterone  medication.   He is eating well.  Denies new lumps/bumps, black stools, blood in stools, blood in urine, abdominal pain, nausea, and vomiting.    REVIEW OF SYSTEMS:   10 Point review of systems of done and is negative except as noted above.  MEDICAL HISTORY Past Medical History:  Diagnosis Date   Allergic rhinitis    Allergy     seasonal  Anal fissure    Bilateral inguinal hernia    Chest pain    Colon polyps    History of kidney stones    Hyperlipidemia    Hypertension    Hypogonadism in male    Hypothyroidism    Osteoporosis    Scrotal mass    Sebaceous cyst    Sleep apnea    CPAP q night - study done 8-10 yrs. ago    Testosterone  deficiency    injections q 3 week- July 8- last injection    Thyroid  disease    Vitamin D  deficiency     SURGICAL HISTORY Past Surgical History:  Procedure  Laterality Date   ANAL FISSURE REPAIR     CHOLECYSTECTOMY     COLONOSCOPY     FIBULA FRACTURE SURGERY     fibula and tibia fracture    FRACTURE SURGERY     L leg -plate & screws - ORIF- fibula   HEMORRHOID SURGERY     HERNIA REPAIR  04/19/2011   bilateral   LAPAROSCOPIC INGUINAL HERNIA REPAIR  04/19/2011   bilateral   LAPAROSCOPY  10/11/2011   Procedure: LAPAROSCOPY DIAGNOSTIC;  Surgeon: Elspeth KYM Schultze, MD;  Location: [facility] OR;  Service: General;  Laterality: N/A;  UMBILICAL EXPLORATION   MASS EXCISION  10/11/2011   Procedure: EXCISION MASS;  Surgeon: Elspeth KYM Schultze, MD;  Location: [facility] OR;  Service: General;  Laterality: N/A;  EXCISION OF UMBILICAL MASS   POLYPECTOMY     right arm surgery      SENTINEL NODE BIOPSY Left 01/16/2024   Procedure: BIOPSY, LYMPH NODE, SENTINEL;  Surgeon: Lyndel Deward PARAS, MD;  Location: [facility];  Service: General;  Laterality: Left;  LEFT AXILLARY LYMPH NODE   UPPER GASTROINTESTINAL ENDOSCOPY      SOCIAL HISTORY Social History[1]  Social History   Social History Narrative   Not on file    SOCIAL DRIVERS OF HEALTH SDOH Screenings   Food Insecurity: No Food Insecurity (02/12/2024)  Housing: Low Risk (02/12/2024)  Transportation Needs: No Transportation Needs (02/12/2024)  Utilities: Not At Risk (02/12/2024)  Alcohol  Screen: Low Risk (09/10/2021)  Depression (PHQ2-9): Low Risk (02/26/2024)  Physical Activity: Sufficiently Active (09/10/2021)  Tobacco Use: Medium Risk (01/30/2024)     FAMILY HISTORY Family History  Problem Relation Age of Onset   Stroke Mother    Stroke Father    Colon cancer Neg Hx    Rectal cancer Neg Hx    Stomach cancer Neg Hx    Esophageal cancer Neg Hx    Colon polyps Neg Hx    Allergic rhinitis Neg Hx    Asthma Neg Hx    Angioedema Neg Hx    Atopy Neg Hx    Eczema Neg Hx    Immunodeficiency Neg Hx    Urticaria Neg Hx      ALLERGIES: is allergic to alpha-gal, rituximab , bovine (beef) protein-containing drug  products, other, and porcine (pork) protein-containing drug products.  MEDICATIONS  Current Outpatient Medications  Medication Sig Dispense Refill   acyclovir  (ZOVIRAX ) 400 MG tablet Take 1 tablet (400 mg total) by mouth daily. 30 tablet 3   allopurinol  (ZYLOPRIM ) 100 MG tablet Take 1 tablet (100 mg total) by mouth 2 (two) times daily. 60 tablet 0   cetirizine (ZYRTEC) 10 MG tablet Take 10 mg by mouth at bedtime.     denosumab  (PROLIA ) 60 MG/ML SOSY injection Inject 60 mg into the skin every 6 (six) months.     dexamethasone  (DECADRON )  4 MG tablet Take 2 tablets (8 mg total) by mouth daily. Start the day after bendamustine  chemotherapy for 2 days. Take with food. 30 tablet 1   dorzolamide -timolol  (COSOPT ) 2-0.5 % ophthalmic solution Place 1 drop into the right eye 2 (two) times daily.     EPINEPHrine  0.3 mg/0.3 mL IJ SOAJ injection Inject 0.3 mg into the muscle as needed for anaphylaxis.     levothyroxine  (SYNTHROID ) 125 MCG tablet Take 125 mcg by mouth every morning.     Magnesium 500 MG CAPS Take 500 mg by mouth at bedtime.     metroNIDAZOLE (METROGEL) 0.75 % gel Apply 1 Application topically in the morning and at bedtime.     Multiple Vitamin (MULTIVITAMIN WITH MINERALS) TABS tablet Take 1 tablet by mouth at bedtime.     ondansetron  (ZOFRAN ) 8 MG tablet Take 1 tablet (8 mg total) by mouth every 8 (eight) hours as needed for nausea or vomiting. Start on the third day after chemotherapy. 30 tablet 1   oxyCODONE -acetaminophen  (PERCOCET) 5-325 MG tablet Take 1 tablet by mouth every 4 (four) hours as needed for severe pain (pain score 7-10). 20 tablet 0   pantoprazole  (PROTONIX ) 40 MG tablet Take 40 mg by mouth every evening.     predniSONE  (DELTASONE ) 20 MG tablet Take 1 tablet (20 mg total) by mouth daily with breakfast. 7 tablet 0   prochlorperazine  (COMPAZINE ) 10 MG tablet Take 1 tablet (10 mg total) by mouth every 6 (six) hours as needed for nausea or vomiting. 30 tablet 1   simvastatin   (ZOCOR ) 40 MG tablet Take 40 mg by mouth at bedtime.     telmisartan (MICARDIS) 40 MG tablet Take 40 mg by mouth at bedtime.     testosterone  cypionate (DEPOTESTOSTERONE CYPIONATE) 200 MG/ML injection Inject 300 mg into the muscle every 21 ( twenty-one) days.     TRELEGY ELLIPTA 100-62.5-25 MCG/ACT AEPB Inhale 1 puff into the lungs in the morning.     No current facility-administered medications for this visit.    PHYSICAL EXAMINATION: ECOG PERFORMANCE STATUS: 2 - Symptomatic, <50% confined to bed VITALS: Vitals:   03/31/24 0920  BP: 117/70  Pulse: 82  Resp: 18  Temp: 97.9 F (36.6 C)  SpO2: 96%   Filed Weights   03/31/24 0920  Weight: 145 lb 4.8 oz (65.9 kg)   Body mass index is 21.46 kg/m.  GENERAL: alert, in no acute distress and comfortable SKIN: no acute rashes, no significant lesions EYES: conjunctiva are pink and non-injected, sclera anicteric OROPHARYNX: MMM, no exudates, no oropharyngeal erythema or ulceration NECK: supple, no JVD LYMPH:  no palpable lymphadenopathy in the cervical, axillary or inguinal regions LUNGS: clear to auscultation b/l with normal respiratory effort HEART: regular rate & rhythm ABDOMEN:  normoactive bowel sounds , non tender, not distended, no hepatosplenomegaly Extremity: no pedal edema PSYCH: alert & oriented x 3 with fluent speech NEURO: no focal motor/sensory deficits  LABORATORY DATA:   I have reviewed the data as listed     Latest Ref Rng & Units 03/31/2024    8:57 AM 03/11/2024   10:29 AM 03/02/2024    1:20 PM  CBC EXTENDED  WBC 4.0 - 10.5 K/uL 6.5  3.9  6.7   RBC 4.22 - 5.81 MIL/uL 2.99  3.30  3.38   Hemoglobin 13.0 - 17.0 g/dL 9.3  89.6  89.7   HCT 60.9 - 52.0 % 29.1  32.2  32.7   Platelets 150 - 400 K/uL 204  137  97   NEUT# 1.7 - 7.7 K/uL 4.3  3.0  3.7   Lymph# 0.7 - 4.0 K/uL 0.9  0.2  1.8         Latest Ref Rng & Units 03/31/2024    8:57 AM 03/11/2024   10:29 AM 03/02/2024    1:20 PM  CMP  Glucose 70 - 99 mg/dL  899  868  864   BUN 8 - 23 mg/dL 16  16  15    Creatinine 0.61 - 1.24 mg/dL 8.84  8.78  8.97   Sodium 135 - 145 mmol/L 137  133  135   Potassium 3.5 - 5.1 mmol/L 4.2  4.5  4.1   Chloride 98 - 111 mmol/L 103  102  100   CO2 22 - 32 mmol/L 23  23  22    Calcium 8.9 - 10.3 mg/dL 9.3  8.8  9.1   Total Protein 6.5 - 8.1 g/dL 7.1  7.2  7.7   Total Bilirubin 0.0 - 1.2 mg/dL 0.4  0.5  0.6   Alkaline Phos 38 - 126 U/L 101  101  212   AST 15 - 41 U/L 29  27  80   ALT 0 - 44 U/L 21  32  114        02/13/2024 FISH       Surgical Pathology 01/30/2024 CASE: [id number] PATIENT: Kayn Behrle Bone Marrow Report  Reason for Addendum #1:  Immunohistochemistry results  Clinical History: Lymphadenopathy     DIAGNOSIS:  BONE MARROW, ASPIRATE, CLOT, CORE: - Hypercellular bone marrow (80%) involved by CD5 positive mature B-cell lymphoma (approximately 40% of cellularity).  See comment.  PERIPHERAL BLOOD: - Normocytic anemia - Thrombocytopenia  COMMENT: The patient's history of lymphadenopathy is noted.  The findings on the bone marrow specimen are similar to those on the lymph node specimen i.e. involvement by the patient's lymphoma to approximately 40% of bone marrow cellularity is observed.  Flow cytometric analysis performed on the bone marrow specimen identified a clonal B-cell population constituting 8% of all lymphocytes.  The cells are positive for CD5, CD19, CD20, CD38, and express kappa light chains.  Additional immunohistochemical stains CD23 and cyclin D1 are pending and will be reported in an addendum.  Correlation with pending cytogenetics, low-grade B-cell lymphoma FISH panel and SOX11 IHC is recommended for further assessment.  MICROSCOPIC DESCRIPTION:  PERIPHERAL BLOOD SMEAR: Platelets: Decreased, no platelet clumps identified Erythroid: Anisopoikilocytosis Leukocytes: Adequate, negative for dysplastic granulocytes or blasts  BONE MARROW ASPIRATE:  Cellular Erythroid precursors: Erythroid precursors show a full sequence of maturation Granulocytic precursors: Granulocytic precursors show a full sequence of maturation Megakaryocytes: Typical in number and morphology Lymphocytes/plasma cells: Mildly increased plasma cells  TOUCH PREPARATIONS: Cellular, confirmatory of aspirate findings  CLOT AND BIOPSY: The bone marrow clot shows predominantly blood and scattered marrow for evaluation.  The bone marrow biopsy reveals a hypercellular bone marrow (80%) with lymphoid aggregates comprised of medium sized atypical cells with convoluted nuclear membranes and prominent nucleoli.  The lymphoid aggregates constitute approximately 40% of bone marrow cellularity.  Background trilineage hematopoiesis is present.  SPECIAL STAINS: CD3: CD3 highlights T lymphocytes and the lymphoid aggregates CD20: CD20 highlights atypical B lymphocytes in the lymphoid aggregates CD5: CD5 highlights the atypical B lymphocytes in the lymphoid aggregates, as well as T lymphocytes Bcl-2: Bcl-2 highlights atypical B lymphocytes as well as T lymphocytes BCL6: BCL6 is negative CD138: CD138 highlights plasma cells MUM1: MUM1 highlights plasma cells Kappa/lambda ISH: Kappa/lambda ISH are  polyclonal and the plasma cells Ki-67: Ki-67 appears mildly elevated to approximately 20%  IRON STAIN: Iron stains are performed on a bone marrow aspirate or touch imprint smear and section of clot. The controls stained appropriately.       Storage Iron: Scant      Ring Sideroblasts: Not identified  ADDITIONAL DATA/TESTING: Cytogenetics   CELL COUNT DATA:  Bone Marrow count performed on 500 cells shows: Blasts:   5%   Myeloid:  58% Promyelocytes: 1%   Erythroid:     25% Myelocytes:    9%   Lymphocytes:   5% Metamyelocytes:     4%   Plasma cells:  11% Bands:    8% Neutrophils:   25%  M:E ratio:     2.32 Eosinophils:   6% Basophils:     0% Monocytes:     1%  Lab Data:  CBC performed on 01/30/2024 shows: WBC: 8.7 k/uL  Neutrophils:   53% Hgb: 9.8 g/dL  Lymphocytes:   72% HCT: 31.8 %    Monocytes:     18% MCV: 83.7 fL   Eosinophils:   1% RDW: 17.3 %    Basophils:     1% PLT: 104 k/uL    GROSS DESCRIPTION:  A: Aspirate smear  B: The specimen is received in B plus fixative, and consists of a 12.0 x 7.0 x 2.0 mm aggregate of red-brown clotted blood.  The specimen is entirely submitted in 1 cassette.  C: The specimen is received in B plus fixative, and consists of 2 cores of red-brown bone, measuring 0.7 and 0.9 cm in length by 0.2 cm in diameter.  The specimen is entirely submitted in 1 cassette following decalcification with Immunocal.  (KL 01/30/2024)    ADDENDUM:   - Cyclin D1 and CD23 stains are negative in the lymphoid aggregates.  Controls are adequate.    ADDENDUM: - Immunohistochemical stains SOX11 performed at [facility] is positive in the neoplastic cells.  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      02/03/2024 Surgical Pathology CASE: [id number] PATIENT: ROSALYNN PIETY Flow Pathology Report     Clinical history: Lymphadenopathy     DIAGNOSIS:  - Abnormal clonal B-cell population identified.  See comment.  COMMENT: Flow cytometric analysis identified an abnormal clonal B-cell population constituting 56% of the lymphocytes.  The cells are positive for CD5, CD19, CD20, CD38 and express kappa light chains.  The findings are consistent with involvement by a CD5 positive mature B-cell lymphoma. Please refer to case [id number] for additional details.   GATING AND PHENOTYPIC ANALYSIS:  Gated population: Flow cytometric immunophenotyping is performed using antibodies to the antigens listed in the table below. Electronic gates are placed around a cell cluster displaying light scatter properties corresponding to: lymphocytes  Abnormal Cells in gated population: 56%  Phenotype of  Abnormal Cells: CD5, CD19, CD20, CD38, Kappa                      Lymphoid Antigens       Myeloid Antigens Miscellaneous CD2  NEG  CD10 NEG  CD11b     ND   CD45 POS CD3  NEG  CD19 POS  CD11c     ND   HLA-Dr    ND CD4  NEG  CD20 POS  CD13 ND   CD34 NEG CD5  POS  CD22 ND   CD14 ND   CD38 NEG CD7  NEG  CD79b     ND   CD15 ND   CD138     ND CD8  NEG  CD103     ND   CD16 ND   TdT  ND CD25 ND   CD200     NEG  CD33 ND   CD123     ND TCRab     ND   sKappa    POS  CD64 ND   CD41 ND TCRgd     NEG  sLambda   NEG  CD117     ND   CD61 ND CD56 NEG  cKappa    ND   MPO  ND   CD71 ND CD57 ND   cLambda   ND        CD235aND      GROSS DESCRIPTION:  Reference Tissue case [id number].   01/30/2024 SURGICAL PATHOLOGY CASE: [id number] PATIENT: ROSALYNN PIETY Surgical Pathology Report     Clinical History: Possible lymphoma, lymphadenopathy, 1.6 cm (las)     FINAL MICROSCOPIC DIAGNOSIS:  A. LYMPH NODE, LEFT SUPRACLAVICULAR, BIOPSY: - CD5 positive mature B-cell lymphoma. See Comment.  COMMENT: - Morphologic evaluation of the needle core biopsy reveals a lymph node with effaced architecture comprised predominantly of medium sized lymphocytes intermixed with larger, more atypical cells. Immunohistochemically the neoplastic cells are positive for CD20, Bcl-2 and show CD5 coexpression.  The cells are negative for CD23.  Cyclin D1 staining is equivocal.  The Ki-67 proliferation index appears somewhat elevated at 30%.  CD10 and BCL6 highlight residual germinal centers. Flow cytometric analysis revealed a clonal B-cell population constituting 56% of the lymphocytes.  The lymphocytes are positive for CD5, CD19, CD20, CD38 and show kappa light chain restriction.  Overall, the findings are consistent with involvement by a B-cell lymphoma. Diagnostic considerations include atypical chronic lymphocytic leukemia/small lymphocytic lymphoma and mantle cell lymphoma.  For further characterization, a  low-grade B-cell lymphoma FISH panel and SOX11 immunohistochemical stain will be performed and reported separately.  GROSS DESCRIPTION:  Specimen is received fresh and consists of multiple core and core fragments of tan-white soft tissue, ranging from 0.1 cm to 1.4 cm in length by 0.1 cm in diameter.  Representative portion of tissue is placed in RPMI for possible flow cytometry, the remaining tissue is entirely submitted in 1 cassette.  SHIRLEEN 01/30/2024)      02/09/2024 Cytogenetics     RADIOGRAPHIC STUDIES: I have personally reviewed the radiological images as listed and agreed with the findings in the report. CT US  GUIDED BIOPSY Result Date: 01/30/2024 INDICATION: Diffuse lymphadenopathy and splenomegaly. Inconclusive pathology after excisional biopsy of a left axillary lymph node. The patient is at or ultrasound-guided biopsy of a hypermetabolic left supraclavicular lymph node. EXAM: ULTRASOUND GUIDED CORE BIOPSY OF LEFT SUPRACLAVICULAR LYMPH NODE MEDICATIONS: None. ANESTHESIA/SEDATION: The procedure immediately followed a bone marrow biopsy or which the patient received 1.5 mg IV versed  and 75 mcg IV fentanyl . PROCEDURE: The procedure, risks, benefits, and alternatives were explained to the patient. Questions regarding the procedure were encouraged and answered. The patient understands and consents to the procedure. A time out was performed prior to initiating the procedure. The left neck was prepped with chlorhexidine  in a sterile fashion, and a sterile drape was applied covering the operative field. A sterile gown and sterile gloves were used for the procedure. Local anesthesia was provided with 1% Lidocaine . After localizing an enlarged left supraclavicular lymph node of, 18 gauge core biopsy device was utilized in obtaining 4 separate core biopsy  samples. Samples were submitted in saline. Additional ultrasound was performed. COMPLICATIONS: None immediate. FINDINGS: An enlarged hypoechoic  lymph node in the left supraclavicular region posterior to the internal jugular vein and corresponding to a hypermetabolic lymph node seen by prior PET scan measures approximately 1.6 x 1.2 x 1.0 cm by ultrasound. Solid core biopsy samples were obtained. IMPRESSION: Ultrasound-guided core biopsy performed of an enlarged left supraclavicular lymph node measuring up to 1.6 cm by ultrasound. Electronically Signed   By: Marcey Moan M.D.   On: 01/30/2024 11:28   CT BONE MARROW BIOPSY & ASPIRATION Result Date: 01/30/2024 CLINICAL DATA:  Extensive lymphadenopathy and massive splenomegaly. Concern for lymphoma versus other lymphoproliferative process. Need for bone marrow biopsy. EXAM: CT GUIDED BONE MARROW ASPIRATION AND BIOPSY ANESTHESIA/SEDATION: Moderate (conscious) sedation was employed during this procedure. A total of Versed  1.5 mg and Fentanyl  75 mcg was administered intravenously. Moderate Sedation Time: 40 minutes which included bone marrow biopsy and under ultrasound guided lymph node biopsy. The patient's level of consciousness and vital signs were monitored continuously by radiology nursing throughout the procedure under my direct supervision. PROCEDURE: The procedure risks, benefits, and alternatives were explained to the patient. Questions regarding the procedure were encouraged and answered. The patient understands and consents to the procedure. A time out was performed prior to initiating the procedure. The right gluteal region was prepped with chlorhexidine . Sterile gown and sterile gloves were used for the procedure. Local anesthesia was provided with 1% Lidocaine . Under CT guidance, an 11 gauge On Control bone cutting needle was advanced from a posterior approach into the right iliac bone. Needle positioning was confirmed with CT. Initial non heparinized and heparinized aspirate samples were obtained of bone marrow. Core biopsy was performed via the On Control drill needle. COMPLICATIONS: None  FINDINGS: Inspection of initial aspirate did reveal visible particles. Intact core biopsy sample was obtained. IMPRESSION: CT guided bone marrow biopsy of right posterior iliac bone with both aspirate and core samples obtained. Electronically Signed   By: Marcey Moan M.D.   On: 01/30/2024 11:24   NM PET Image Initial (PI) Skull Base To Thigh (F-18 FDG) Result Date: 01/14/2024 CLINICAL DATA:  Initial treatment strategy for lymphadenopathy. EXAM: NUCLEAR MEDICINE PET SKULL BASE TO THIGH TECHNIQUE: 7.6 mCi F-18 FDG was injected intravenously. Full-ring PET imaging was performed from the skull base to thigh after the radiotracer. CT data was obtained and used for attenuation correction and anatomic localization. Fasting blood glucose: 72 mg/dl COMPARISON:  CT scan 89/85/7974 FINDINGS: Mediastinal blood pool activity: SUV max 1.4 Liver activity: SUV max 3.5 NECK: Small multistation hypermetabolic cervical lymph nodes, mainly in the lower neck and supraclavicular regions. SUV max ranges 3.6 to 4.1. There are also multiple hypermetabolic foci in both lobes of the thyroid  but no CT correlate. SUV max is 6.0 in the right upper anterior thyroid  lobe. Incidental CT findings: None CHEST: Numerous scattered hypermetabolic supraclavicular, axillary, mediastinal and hilar lymph nodes. Index left axillary node measures 8 mm on image 60/4. SUV max is 4.8. Right paratracheal node measures 13 mm on image 66/4 and SUV max is 9.8. Lymph node in the anterior mediastinum on the right side measures 11 mm and SUV max is 8.7. Subcarinal adenopathy has an SUV max of 9.6. Severe underlying lung disease with emphysema and pulmonary fibrosis. Patchy areas of parenchymal hypermetabolism likely inflammatory or possibly superimposed infection. Incidental CT findings: Atherosclerotic calcifications involving the aorta and coronary arteries and aortic valve. Moderate-sized hiatal hernia. ABDOMEN/PELVIS: Massive splenomegaly  with diffuse and  marked hypermetabolism highly suggestive of lymphoma. There is also an accessory spleen near the splenic hilum which is markedly hypermetabolic. No hepatic, pancreatic, adrenal or renal lesions. Fairly extensive upper abdominal and retroperitoneal lymphadenopathy all demonstrating hypermetabolism. Index left para-aortic node on image 124/4 measures 16 mm and SUV max is 11.6. 10 mm para duodenal node on image 128/4 has an SUV max of 8.2. Pelvic sidewall and external iliac adenopathy. Index external iliac node the left measures 10 mm on image 167/4 and SUV max is 8.0. Right external iliac node measures 9 mm and SUV max is 7.2. Small minimally hypermetabolic inguinal nodes. Incidental CT findings: Dependent layering bladder calculi noted. Advanced vascular calcifications are stable. Renal calculi are noted. Status post cholecystectomy. No biliary dilatation. SKELETON: Diffuse hypermetabolic bone disease involving the axial and appendicular skeleton. Sternal lesion has an SUV max of 5.6. Right humeral disease has an SUV max of 5.7. Diffuse spinal disease has an SUV max of 6.3. Diffuse pelvic disease has an SUV max of 6.0. Incidental CT findings: No visible lytic or sclerotic bone lesions are identified on the CT scan. IMPRESSION: 1. Extensive hypermetabolic lymphadenopathy involving the neck, chest, abdomen and pelvis. 2. Massive splenomegaly with diffuse and marked hypermetabolism. 3. Diffuse hypermetabolic bone disease. 4. Diffuse hypermetabolic lesions in the thyroid  gland is likely lymphomatous involvement. 5. Overall findings are most consistent with lymphoma (Deauville 5). 6. Severe underlying lung disease with emphysema and pulmonary fibrosis. Patchy areas of parenchymal hypermetabolism likely inflammatory or possibly superimposed infection. Aortic Atherosclerosis (ICD10-I70.0) and Emphysema (ICD10-J43.9). Electronically Signed   By: MYRTIS Stammer M.D.   On: 01/14/2024 18:20   CT ABDOMEN PELVIS WO  CONTRAST Result Date: 01/06/2024 EXAM: CT ABDOMEN AND PELVIS WITHOUT CONTRAST 01/06/2024 06:12:00 PM TECHNIQUE: CT of the abdomen and pelvis was performed without the administration of intravenous contrast. Multiplanar reformatted images are provided for review. Automated exposure control, iterative reconstruction, and/or weight-based adjustment of the mA/kV was utilized to reduce the radiation dose to as low as reasonably achievable. COMPARISON: CT abdomen and pelvis 01/31/2019. CLINICAL HISTORY: Left lower quadrant abdominal pain, Unintended weight loss. FINDINGS: LOWER CHEST: Multifocal ground-glass opacities throughout the bilateral lung bases, right greater than left. Emphysematous changes are seen in the right lung base. LIVER: There is an 8 mm hypodensity in the left lobe of the liver and an 18 mm hypodensity in the left lobe of the liver. These are new from prior. GALLBLADDER AND BILE DUCTS: Gallbladder is surgically absent. No biliary ductal dilatation. SPLEEN: Spleen is markedly enlarged measuring up to 20.8 cm in length. PANCREAS: No acute abnormality. ADRENAL GLANDS: No acute abnormality. KIDNEYS, URETERS AND BLADDER: Punctate bilateral nonobstructing renal calculi. No hydronephrosis. Mild nonspecific bilateral perinephric fat stranding. Bladder is distended. There is mild diffuse bladder wall thickening. Calcifications are seen in the dependent portion of the bladder. GI AND BOWEL: Moderate-sized hiatal hernia. Stomach demonstrates no acute abnormality. The cecum is high riding. The appendix appears within normal limits. There is no bowel obstruction. PERITONEUM AND RETROPERITONEUM: No ascites. No free air. VASCULATURE: Aorta is normal in caliber. Atherosclerotic calcifications of the aorta. LYMPH NODES: There are numerous new retroperitoneal and upper abdominal enlarged lymph nodes. Enlarged cortical lymph node measuring 3.4 x 2.2 cm. Peritoneal lymph nodes have increased in size near the splenic  hilum. Numerous nonenlarged and mildly enlarged mesenteric lymph nodes measuring up to 12 mm. Enlarged retroperitoneal lymph nodes diffusely measuring up to 12 mm. Enlarged left external iliac lymph nodes  measured up to 15 mm. Enlarged right external iliac lymph nodes measuring up to 12 mm. Prominent bilateral inguinal lymph nodes. Enlarged gastropancreatic lymph node measuring 11 mm. REPRODUCTIVE ORGANS: Prostate gland is enlarged. BONES AND SOFT TISSUES: No acute osseous abnormality. No focal soft tissue abnormality. IMPRESSION: 1. New multistation lymphadenopathy involving the splenic hilum, upper abdomen retroperitoneum, external iliac chains, and inguinal regions. Findings are concerning for lymphoma or metastatic disease. 2. New Marked splenomegaly measuring up to 20.8 cm. 3. New 8 mm and 18 mm hypodensities in the left hepatic lobe. 4. Moderate hiatal hernia. Electronically signed by: Greig Pique MD 01/06/2024 06:26 PM EDT RP Workstation: [id number]    ASSESSMENT & PLAN:  84 y.o. 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 - FISH TP53 wild type.   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    PLAN: - Discussed lab results on 03/31/2024 in detail with patient: - CMP   - Creatinine:  1.15 - Calcium: 9.3  - CBC  - Hemoglobin: 9.3  - WBC: 6.5  - Platelets: 204  - Discussed CT results with patient  - scan found bladder stones - Stop taking allopurinol  (ZYLOPRIM ) -- low subsequent TLS risk at this time. - Receive day 1 cycle 2 of treatment today - Order urine sample  - Rituxan  as per integrated scheduling in 4 weeks  FOLLOW-UP with Dr. Onesimo with labs withcycle 3 Bendamustine    The total time spent in the appointment was 30 minutes* .  All of the patient's questions were answered and the patient knows to call the clinic with any problems, questions, or concerns.  Emaline Onesimo MD MS AAHIVMS [facility]  [facility] Hematology/Oncology Physician [facility]  *Total Encounter Time as defined by the Centers for Medicare and Medicaid Services includes, in addition to the face-to-face time of a patient visit (documented in the note above) non-face-to-face time: obtaining and reviewing outside history, ordering and reviewing medications, tests or procedures, care coordination (communications with other health care professionals or caregivers) and documentation in the medical record.  I, Marijo Sharps, acting as a neurosurgeon for Emaline Onesimo, MD.,have documented all relevant documentation on the behalf of Emaline Onesimo, MD,as directed by  Emaline Onesimo, MD while in the presence of Emaline Onesimo, MD.  I have reviewed the above documentation for accuracy and completeness, and I agree with the above.  Emaline Onesimo, MD      [1]  Social History Tobacco Use   Smoking status: Former    Current packs/day: 0.00    Types: Cigarettes    Quit date: 06/05/1998    Years since quitting: 25.8   Smokeless tobacco: Never  Vaping Use   Vaping status: Never Used  Substance Use Topics   Alcohol  use: No   Drug use: No   "

## 2024-03-31 NOTE — Patient Instructions (Signed)
 [facility] - A DEPT OF [facility]  Discharge Instructions: Thank you for choosing [facility] to provide your oncology and hematology care.   If you have a lab appointment with the Cancer Center, please go directly to the Cancer Center and check in at the registration area.   Wear comfortable clothing and clothing appropriate for easy access to any Portacath or PICC line.   We strive to give you quality time with your provider. You may need to reschedule your appointment if you arrive late (15 or more minutes).  Arriving late affects you and other patients whose appointments are after yours.  Also, if you miss three or more appointments without notifying the office, you may be dismissed from the clinic at the providers discretion.      For prescription refill requests, have your pharmacy contact our office and allow 72 hours for refills to be completed.    Today you received the following chemotherapy and/or immunotherapy agents: bendamustine  (BENDEKA ), rituximab       To help prevent nausea and vomiting after your treatment, we encourage you to take your nausea medication as directed.  BELOW ARE SYMPTOMS THAT SHOULD BE REPORTED IMMEDIATELY: *FEVER GREATER THAN 100.4 F (38 C) OR HIGHER *CHILLS OR SWEATING *NAUSEA AND VOMITING THAT IS NOT CONTROLLED WITH YOUR NAUSEA MEDICATION *UNUSUAL SHORTNESS OF BREATH *UNUSUAL BRUISING OR BLEEDING *URINARY PROBLEMS (pain or burning when urinating, or frequent urination) *BOWEL PROBLEMS (unusual diarrhea, constipation, pain near the anus) TENDERNESS IN MOUTH AND THROAT WITH OR WITHOUT PRESENCE OF ULCERS (sore throat, sores in mouth, or a toothache) UNUSUAL RASH, SWELLING OR PAIN  UNUSUAL VAGINAL DISCHARGE OR ITCHING   Items with * indicate a potential emergency and should be followed up as soon as possible or go to the Emergency Department if any problems should occur.  Please show the CHEMOTHERAPY  ALERT CARD or IMMUNOTHERAPY ALERT CARD at check-in to the Emergency Department and triage nurse.  Should you have questions after your visit or need to cancel or reschedule your appointment, please contact [facility]  Dept: [phone]  and follow the prompts.  Office hours are 8:00 a.m. to 4:30 p.m. Monday - Friday. Please note that voicemails left after 4:00 p.m. may not be returned until the following business day.  We are closed weekends and major holidays. You have access to a nurse at all times for urgent questions. Please call the main number to the clinic Dept: [phone] and follow the prompts.   For any non-urgent questions, you may also contact your provider using MyChart. We now offer e-Visits for anyone 87 and older to request care online for non-urgent symptoms. For details visit [email or web address].   Also download the MyChart app! Go to the app store, search MyChart, open the app, select [facility], and log in with your MyChart username and password.

## 2024-03-31 NOTE — Progress Notes (Signed)
 Due to patient being a difficult stick, Dr. Onesimo gave OK to leave pts PIV intact for tomorrows (04/02/2023) treatment. This nurse educated pt and wife of the potential for PIV to become dislodged. Pt and wife were educated to apply pressure to site to stop bleeding. PIV was capped with green cap and covered with a PICC line sleeve and loosely wrapped with coban prior to discharge. During pts tx, port placement was discussed but pt stated that he would consider a port if his amount of treatments were increased.

## 2024-04-01 ENCOUNTER — Inpatient Hospital Stay

## 2024-04-01 VITALS — BP 113/60 | HR 70 | Temp 97.5°F | Resp 18

## 2024-04-01 DIAGNOSIS — C8318 Mantle cell lymphoma, lymph nodes of multiple sites: Secondary | ICD-10-CM

## 2024-04-01 DIAGNOSIS — Z5112 Encounter for antineoplastic immunotherapy: Secondary | ICD-10-CM | POA: Diagnosis not present

## 2024-04-01 LAB — URINE CULTURE: Culture: NO GROWTH

## 2024-04-01 MED ORDER — DEXAMETHASONE SOD PHOSPHATE PF 10 MG/ML IJ SOLN
10.0000 mg | Freq: Once | INTRAMUSCULAR | Status: AC
  Administered 2024-04-01: 10 mg via INTRAVENOUS
  Filled 2024-04-01: qty 1

## 2024-04-01 MED ORDER — SODIUM CHLORIDE 0.9 % IV SOLN: INTRAVENOUS | Status: DC

## 2024-04-01 MED ORDER — SODIUM CHLORIDE 0.9 % IV SOLN
70.0000 mg/m2 | Freq: Once | INTRAVENOUS | Status: AC
  Administered 2024-04-01: 125 mg via INTRAVENOUS
  Filled 2024-04-01: qty 5

## 2024-04-01 NOTE — Patient Instructions (Signed)
 [facility] - A DEPT OF [facility]  Discharge Instructions: Thank you for choosing [facility] to provide your oncology and hematology care.   If you have a lab appointment with the Cancer Center, please go directly to the Cancer Center and check in at the registration area.   Wear comfortable clothing and clothing appropriate for easy access to any Portacath or PICC line.   We strive to give you quality time with your provider. You may need to reschedule your appointment if you arrive late (15 or more minutes).  Arriving late affects you and other patients whose appointments are after yours.  Also, if you miss three or more appointments without notifying the office, you may be dismissed from the clinic at the provider's discretion.      For prescription refill requests, have your pharmacy contact our office and allow 72 hours for refills to be completed.    Today you received the following chemotherapy and/or immunotherapy agents: bendamustine [facility])  To help prevent nausea and vomiting after your treatment, we encourage you to take your nausea medication as directed.  BELOW ARE SYMPTOMS THAT SHOULD BE REPORTED IMMEDIATELY: *FEVER GREATER THAN 100.4 F (38 C) OR HIGHER *CHILLS OR SWEATING *NAUSEA AND VOMITING THAT IS NOT CONTROLLED WITH YOUR NAUSEA MEDICATION *UNUSUAL SHORTNESS OF BREATH *UNUSUAL BRUISING OR BLEEDING *URINARY PROBLEMS (pain or burning when urinating, or frequent urination) *BOWEL PROBLEMS (unusual diarrhea, constipation, pain near the anus) TENDERNESS IN MOUTH AND THROAT WITH OR WITHOUT PRESENCE OF ULCERS (sore throat, sores in mouth, or a toothache) UNUSUAL RASH, SWELLING OR PAIN  UNUSUAL VAGINAL DISCHARGE OR ITCHING   Items with * indicate a potential emergency and should be followed up as soon as possible or go to the Emergency Department if any problems should occur.  Please show the CHEMOTHERAPY ALERT CARD or  IMMUNOTHERAPY ALERT CARD at check-in to the Emergency Department and triage nurse.  Should you have questions after your visit or need to cancel or reschedule your appointment, please contact [facility]  Dept: [phone]  and follow the prompts.  Office hours are 8:00 a.m. to 4:30 p.m. Monday - Friday. Please note that voicemails left after 4:00 p.m. may not be returned until the following business day.  We are closed weekends and major holidays. You have access to a nurse at all times for urgent questions. Please call the main number to the clinic Dept: [phone] and follow the prompts.   For any non-urgent questions, you may also contact your provider using MyChart. We now offer e-Visits for anyone 46 and older to request care online for non-urgent symptoms. For details visit [email or web address].   Also download the MyChart app! Go to the app store, search MyChart, open the app, select [facility], and log in with your MyChart username and password.

## 2024-04-06 ENCOUNTER — Encounter: Payer: Self-pay | Admitting: Hematology

## 2024-04-06 ENCOUNTER — Encounter: Payer: Self-pay | Admitting: Internal Medicine

## 2024-04-06 ENCOUNTER — Encounter: Payer: Self-pay | Admitting: Physician Assistant

## 2024-04-28 ENCOUNTER — Inpatient Hospital Stay: Admitting: Dietician

## 2024-04-28 ENCOUNTER — Inpatient Hospital Stay: Admitting: Hematology

## 2024-04-28 ENCOUNTER — Inpatient Hospital Stay

## 2024-04-28 ENCOUNTER — Inpatient Hospital Stay: Attending: Physician Assistant

## 2024-04-28 VITALS — BP 127/69 | HR 54 | Temp 97.7°F | Resp 18 | Wt 155.4 lb

## 2024-04-28 VITALS — BP 139/60 | HR 59 | Temp 98.1°F | Resp 16

## 2024-04-28 DIAGNOSIS — C8318 Mantle cell lymphoma, lymph nodes of multiple sites: Secondary | ICD-10-CM

## 2024-04-28 DIAGNOSIS — Z5111 Encounter for antineoplastic chemotherapy: Secondary | ICD-10-CM

## 2024-04-28 LAB — CBC WITH DIFFERENTIAL (CANCER CENTER ONLY)
Abs Immature Granulocytes: 0.01 10*3/uL (ref 0.00–0.07)
Basophils Absolute: 0 10*3/uL (ref 0.0–0.1)
Basophils Relative: 1 %
Eosinophils Absolute: 0.3 10*3/uL (ref 0.0–0.5)
Eosinophils Relative: 4 %
HCT: 40.7 % (ref 39.0–52.0)
Hemoglobin: 13.4 g/dL (ref 13.0–17.0)
Immature Granulocytes: 0 %
Lymphocytes Relative: 5 %
Lymphs Abs: 0.3 10*3/uL — ABNORMAL LOW (ref 0.7–4.0)
MCH: 32.2 pg (ref 26.0–34.0)
MCHC: 32.9 g/dL (ref 30.0–36.0)
MCV: 97.8 fL (ref 80.0–100.0)
Monocytes Absolute: 0.7 10*3/uL (ref 0.1–1.0)
Monocytes Relative: 11 %
Neutro Abs: 4.8 10*3/uL (ref 1.7–7.7)
Neutrophils Relative %: 79 %
Platelet Count: 145 10*3/uL — ABNORMAL LOW (ref 150–400)
RBC: 4.16 MIL/uL — ABNORMAL LOW (ref 4.22–5.81)
RDW: 15.5 % (ref 11.5–15.5)
WBC Count: 6.1 10*3/uL (ref 4.0–10.5)
nRBC: 0 % (ref 0.0–0.2)

## 2024-04-28 LAB — CMP (CANCER CENTER ONLY)
ALT: 27 U/L (ref 0–44)
AST: 29 U/L (ref 15–41)
Albumin: 4.2 g/dL (ref 3.5–5.0)
Alkaline Phosphatase: 87 U/L (ref 38–126)
Anion gap: 9 (ref 5–15)
BUN: 14 mg/dL (ref 8–23)
CO2: 26 mmol/L (ref 22–32)
Calcium: 9.5 mg/dL (ref 8.9–10.3)
Chloride: 105 mmol/L (ref 98–111)
Creatinine: 1.15 mg/dL (ref 0.61–1.24)
GFR, Estimated: >60 mL/min
Glucose, Bld: 82 mg/dL (ref 70–99)
Potassium: 4.1 mmol/L (ref 3.5–5.1)
Sodium: 139 mmol/L (ref 135–145)
Total Bilirubin: 0.4 mg/dL (ref 0.0–1.2)
Total Protein: 7.1 g/dL (ref 6.5–8.1)

## 2024-04-28 MED ORDER — SODIUM CHLORIDE 0.9 % IV SOLN
70.0000 mg/m2 | Freq: Once | INTRAVENOUS | Status: AC
  Administered 2024-04-28: 125 mg via INTRAVENOUS
  Filled 2024-04-28: qty 5

## 2024-04-28 MED ORDER — PALONOSETRON HCL INJECTION 0.25 MG/5ML
0.2500 mg | Freq: Once | INTRAVENOUS | Status: AC
  Administered 2024-04-28: 0.25 mg via INTRAVENOUS
  Filled 2024-04-28: qty 5

## 2024-04-28 MED ORDER — METHYLPREDNISOLONE SODIUM SUCC 125 MG IJ SOLR
125.0000 mg | Freq: Once | INTRAMUSCULAR | Status: AC
  Administered 2024-04-28: 125 mg via INTRAVENOUS
  Filled 2024-04-28: qty 2

## 2024-04-28 MED ORDER — SODIUM CHLORIDE 0.9 % IV SOLN
375.0000 mg/m2 | Freq: Once | INTRAVENOUS | Status: AC
  Administered 2024-04-28: 700 mg via INTRAVENOUS
  Filled 2024-04-28: qty 50

## 2024-04-28 MED ORDER — DIPHENHYDRAMINE HCL 25 MG PO CAPS
50.0000 mg | ORAL_CAPSULE | Freq: Once | ORAL | Status: AC
  Administered 2024-04-28: 50 mg via ORAL
  Filled 2024-04-28: qty 2

## 2024-04-28 MED ORDER — MONTELUKAST SODIUM 10 MG PO TABS
10.0000 mg | ORAL_TABLET | Freq: Once | ORAL | Status: AC
  Administered 2024-04-28: 10 mg via ORAL
  Filled 2024-04-28: qty 1

## 2024-04-28 MED ORDER — ACETAMINOPHEN 500 MG PO TABS
1000.0000 mg | ORAL_TABLET | Freq: Once | ORAL | Status: AC
  Administered 2024-04-28: 1000 mg via ORAL
  Filled 2024-04-28: qty 2

## 2024-04-28 MED ORDER — MORPHINE SULFATE (PF) 2 MG/ML IV SOLN
2.0000 mg | Freq: Once | INTRAVENOUS | Status: AC
  Administered 2024-04-28: 2 mg via INTRAVENOUS
  Filled 2024-04-28: qty 1

## 2024-04-28 MED ORDER — SODIUM CHLORIDE 0.9 % IV SOLN: INTRAVENOUS | Status: DC

## 2024-04-28 MED ORDER — FAMOTIDINE IN NACL 20-0.9 MG/50ML-% IV SOLN
20.0000 mg | Freq: Once | INTRAVENOUS | Status: AC
  Administered 2024-04-28: 20 mg via INTRAVENOUS
  Filled 2024-04-28: qty 50

## 2024-04-28 NOTE — Progress Notes (Signed)
 Patient is a difficult stick, required multiple attempts at IV placement today. IV team was able to get an IV in the R anterior upper arm. Per Dr Onesimo, since today's IV was so difficult, okay to leave PIV in place for treatment tomorrow.  IV was flushed, secured with curos cap and wrapped with gauze and breathable mesh cover. Patient and spouse given instructions on infection prevention.  Port placement was discussed with patient and spouse again today, stating they will wait until after his upcoming scan to discuss with MD.

## 2024-04-28 NOTE — Patient Instructions (Signed)
 [facility] - A DEPT OF [facility]  Discharge Instructions: Thank you for choosing [facility] to provide your oncology and hematology care.   If you have a lab appointment with the Cancer Center, please go directly to the Cancer Center and check in at the registration area.   Wear comfortable clothing and clothing appropriate for easy access to any Portacath or PICC line.   We strive to give you quality time with your provider. You may need to reschedule your appointment if you arrive late (15 or more minutes).  Arriving late affects you and other patients whose appointments are after yours.  Also, if you miss three or more appointments without notifying the office, you may be dismissed from the clinic at the providers discretion.      For prescription refill requests, have your pharmacy contact our office and allow 72 hours for refills to be completed.    Today you received the following chemotherapy and/or immunotherapy agents: bendamustine  (BENDEKA ), rituximab       To help prevent nausea and vomiting after your treatment, we encourage you to take your nausea medication as directed.  BELOW ARE SYMPTOMS THAT SHOULD BE REPORTED IMMEDIATELY: *FEVER GREATER THAN 100.4 F (38 C) OR HIGHER *CHILLS OR SWEATING *NAUSEA AND VOMITING THAT IS NOT CONTROLLED WITH YOUR NAUSEA MEDICATION *UNUSUAL SHORTNESS OF BREATH *UNUSUAL BRUISING OR BLEEDING *URINARY PROBLEMS (pain or burning when urinating, or frequent urination) *BOWEL PROBLEMS (unusual diarrhea, constipation, pain near the anus) TENDERNESS IN MOUTH AND THROAT WITH OR WITHOUT PRESENCE OF ULCERS (sore throat, sores in mouth, or a toothache) UNUSUAL RASH, SWELLING OR PAIN  UNUSUAL VAGINAL DISCHARGE OR ITCHING   Items with * indicate a potential emergency and should be followed up as soon as possible or go to the Emergency Department if any problems should occur.  Please show the CHEMOTHERAPY  ALERT CARD or IMMUNOTHERAPY ALERT CARD at check-in to the Emergency Department and triage nurse.  Should you have questions after your visit or need to cancel or reschedule your appointment, please contact [facility]  Dept: [phone]  and follow the prompts.  Office hours are 8:00 a.m. to 4:30 p.m. Monday - Friday. Please note that voicemails left after 4:00 p.m. may not be returned until the following business day.  We are closed weekends and major holidays. You have access to a nurse at all times for urgent questions. Please call the main number to the clinic Dept: [phone] and follow the prompts.   For any non-urgent questions, you may also contact your provider using MyChart. We now offer e-Visits for anyone 87 and older to request care online for non-urgent symptoms. For details visit [email or web address].   Also download the MyChart app! Go to the app store, search MyChart, open the app, select [facility], and log in with your MyChart username and password.

## 2024-04-28 NOTE — Progress Notes (Signed)
 Nutrition Assessment   Reason for Assessment: +MST   ASSESSMENT: 84 year old male with mantle cell lymphoma of lymph nodes of multiple regions. He is receiving rituximab  + bendamustine  q28d. Patient is under the care of Dr. Onesimo  Past medical history includes hiatal hernia, interstitial pulmonary fibrosis, OSA, GERD, dysphagia, diastasis recti, osteoporosis, IDA, ITP, alpha-gal  Met with patient and wife in infusion. He is eating subway at visit. Appetite has come back around and eating well. He has gained 10 lbs in the last 4 weeks which they are pleased with. Wife is pushing protein foods (eggs, cheese, fish, chicken, turkey, beans). Due to alpha-gal pt does not consume red meat/pork. Patient eating 3 meals and snacks which include fruit or cookies. He denies NIS at this time.    Nutrition Focused Physical Exam: deferred   Medications: acyclovir , prolia , decadron , synthroid , mg, MVI, zofran , percocet, protonix , compazine , zocor , flomax , micardis, testosterone  cypionate, trelegy   Labs: reviewed    Anthropometrics:   Height: 5'9 Weight: 155 lb 6.4 oz  UBW: 180 lb (per wife prior to diagnosis) BMI: 22.95   NUTRITION DIAGNOSIS: Food and nutrition related knowledge deficit related to cancer as evidenced by no prior need for associated nutrition information    INTERVENTION:  Continue strategies for increasing calories and protein with smaller meals more often Offered suggestions for adding protein to snacks (peanut butter/cottage cheese with fruit, glass of milk with cookies) Contact information provided    MONITORING, EVALUATION, GOAL: Patient will tolerate adequate calories and protein to support wt maintenance    Next Visit: No follow-up scheduled. Encouraged to contact should nutrition questions/concerns arise

## 2024-04-28 NOTE — Progress Notes (Shared)
 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   HISTORY OF PRESENTING ILLNESS: (02/12/2024) Douglas [name] is a wonderful 84 y.o. male who has been referred to us  by Dr. Shayne Anes, MD for evaluation and management of evaluation of abnormal CT finding: new multistation lymphadenopathy suspicious for lymphoma vs malignancy, splenomegaly, hepatic hypodensities, and acute symptoms of abdominal pain. is ambulating with a wheelchair , accompanied by wife    He does endorse feeling fatigued, but this has been improved since starting steroids.    Additionally, he notes his pulmonary fibrosis symptoms have improved. This was diagnosed 4-5 years as idiopathic pulmonary fibrosis. He notes a history of smoking 1 PPD x 35 years. He has not had a lung biopsy, but many scans to confirm this diagnosis. He began Prednisone  60 mg on 02/04/2024.  He is still reportedly experiencing a fair amount of night sweats.   He denies having noticed any new lymph nodes, however did notice a lump in his abdomen. Denies abdominal pain or injuries.    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 He  is accompanied by his wife.   he was last seen by me on 03/31/2024; at the time he mentioned experiencing fatigue.  Today, he states that he is dragging his left foot and is feeling numbness in the same foot since he fell roughly 3 weeks ago. He states that he fell because a toe flipped underneath his foot and he tripped.   His wife notes that he has exhibited having more energy recently. She also reports that his urine flow has improved.  He is not experiencing any issues with his treatment medication.  He is still taking a B complex vitamin.   He states that his fibrosis is improving.   Denies lumps/bumps, fevers, chills, night sweats, back pain, nausea, constipation, and weight loss.   REVIEW OF SYSTEMS:   10 Point review of systems of done and is negative except as noted above.  MEDICAL HISTORY Past Medical History:  Diagnosis Date   Allergic rhinitis    Allergy     seasonal   Anal fissure    Bilateral inguinal hernia    Chest pain    Colon polyps    History of kidney stones    Hyperlipidemia    Hypertension    Hypogonadism in male    Hypothyroidism    Osteoporosis    Scrotal mass    Sebaceous cyst    Sleep apnea    CPAP q night - study done 8-10 yrs. ago    Testosterone  deficiency    injections q 3 week- July 8- last injection    Thyroid  disease    Vitamin  D deficiency     SURGICAL HISTORY Past Surgical History:  Procedure Laterality Date   ANAL FISSURE REPAIR     CHOLECYSTECTOMY     COLONOSCOPY     FIBULA FRACTURE SURGERY     fibula and tibia fracture    FRACTURE SURGERY     L leg -plate & screws - ORIF- fibula   HEMORRHOID SURGERY     HERNIA REPAIR  04/19/2011   bilateral   LAPAROSCOPIC INGUINAL HERNIA REPAIR  04/19/2011   bilateral   LAPAROSCOPY  10/11/2011   Procedure: LAPAROSCOPY DIAGNOSTIC;  Surgeon: Elspeth KYM Schultze, MD;  Location: [facility] OR;  Service: General;  Laterality: N/A;  UMBILICAL EXPLORATION   MASS EXCISION  10/11/2011   Procedure: EXCISION  MASS;  Surgeon: Elspeth KYM Schultze, MD;  Location: [facility] OR;  Service: General;  Laterality: N/A;  EXCISION OF UMBILICAL MASS   POLYPECTOMY     right arm surgery      SENTINEL NODE BIOPSY Left 01/16/2024   Procedure: BIOPSY, LYMPH NODE, SENTINEL;  Surgeon: Lyndel Deward PARAS, MD;  Location: [facility];  Service: General;  Laterality: Left;  LEFT AXILLARY LYMPH NODE   UPPER GASTROINTESTINAL ENDOSCOPY      SOCIAL HISTORY Social History[1]  Social History   Social History Narrative   Not on file    SOCIAL DRIVERS OF HEALTH SDOH Screenings   Food Insecurity: No Food Insecurity (02/12/2024)  Housing: Low Risk (02/12/2024)  Transportation Needs: No Transportation Needs (02/12/2024)  Utilities: Not At Risk (02/12/2024)  Alcohol  Screen: Low Risk (09/10/2021)  Depression (PHQ2-9): Low Risk (04/01/2024)  Physical Activity: Sufficiently Active (09/10/2021)  Tobacco Use: Medium Risk (01/30/2024)     FAMILY HISTORY Family History  Problem Relation Age of Onset   Stroke Mother    Stroke Father    Colon cancer Neg Hx    Rectal cancer Neg Hx    Stomach cancer Neg Hx    Esophageal cancer Neg Hx    Colon polyps Neg Hx    Allergic rhinitis Neg Hx    Asthma Neg Hx    Angioedema Neg Hx    Atopy Neg Hx    Eczema Neg Hx    Immunodeficiency Neg Hx    Urticaria Neg Hx      ALLERGIES: is allergic to alpha-gal, rituximab , bovine (beef) protein-containing drug products, other, and porcine (pork) protein-containing drug products.  MEDICATIONS  Current Outpatient Medications  Medication Sig Dispense Refill   acyclovir  (ZOVIRAX ) 400 MG tablet Take 1 tablet (400 mg total) by mouth daily. 30 tablet 3   cetirizine (ZYRTEC) 10 MG tablet Take 10 mg by mouth at bedtime.     denosumab  (PROLIA ) 60 MG/ML SOSY injection Inject 60 mg into the skin every 6 (six) months.     dexamethasone  (DECADRON ) 4 MG tablet Take 2 tablets (8 mg total) by mouth daily. Start the day after bendamustine  chemotherapy for 2 days.  Take with food. 30 tablet 1   dorzolamide -timolol  (COSOPT ) 2-0.5 % ophthalmic solution Place 1 drop into the right eye 2 (two) times daily.     EPINEPHrine  0.3 mg/0.3 mL IJ SOAJ injection Inject 0.3 mg into the muscle as needed for anaphylaxis.     levothyroxine  (SYNTHROID ) 125 MCG tablet Take 125 mcg by mouth every morning.     Magnesium 500 MG CAPS Take 500 mg by mouth at bedtime.     metroNIDAZOLE (METROGEL) 0.75 % gel Apply 1 Application topically in the morning and at bedtime.  Multiple Vitamin (MULTIVITAMIN WITH MINERALS) TABS tablet Take 1 tablet by mouth at bedtime.     ondansetron  (ZOFRAN ) 8 MG tablet Take 1 tablet (8 mg total) by mouth every 8 (eight) hours as needed for nausea or vomiting. Start on the third day after chemotherapy. 30 tablet 1   oxyCODONE -acetaminophen  (PERCOCET) 5-325 MG tablet Take 1 tablet by mouth every 4 (four) hours as needed for severe pain (pain score 7-10). (Patient not taking: Reported on 03/31/2024) 20 tablet 0   pantoprazole  (PROTONIX ) 40 MG tablet Take 40 mg by mouth every evening.     prochlorperazine  (COMPAZINE ) 10 MG tablet Take 1 tablet (10 mg total) by mouth every 6 (six) hours as needed for nausea or vomiting. 30 tablet 1   simvastatin  (ZOCOR ) 40 MG tablet Take 40 mg by mouth at bedtime.     tamsulosin  (FLOMAX ) 0.4 MG CAPS capsule Take 1 capsule (0.4 mg total) by mouth daily after supper. 30 capsule 2   telmisartan (MICARDIS) 40 MG tablet Take 40 mg by mouth at bedtime.     testosterone  cypionate (DEPOTESTOSTERONE CYPIONATE) 200 MG/ML injection Inject 300 mg into the muscle every 21 ( twenty-one) days.     TRELEGY ELLIPTA 100-62.5-25 MCG/ACT AEPB Inhale 1 puff into the lungs in the morning.     No current facility-administered medications for this visit.    PHYSICAL EXAMINATION: ECOG PERFORMANCE STATUS: 0 - Asymptomatic VITALS: There were no vitals filed for this visit. There were no vitals filed for this visit. There is no height or weight on  file to calculate BMI.  GENERAL: alert, in no acute distress and comfortable SKIN: no acute rashes, no significant lesions EYES: conjunctiva are pink and non-injected, sclera anicteric OROPHARYNX: MMM, no exudates, no oropharyngeal erythema or ulceration NECK: supple, no JVD LYMPH:  no palpable lymphadenopathy in the cervical, axillary or inguinal regions LUNGS: clear to auscultation b/l with normal respiratory effort HEART: regular rate & rhythm ABDOMEN:  normoactive bowel sounds , non tender, not distended, no hepatosplenomegaly Extremity: no pedal edema PSYCH: alert & oriented x 3 with fluent speech NEURO: no focal motor/sensory deficits  LABORATORY DATA:   I have reviewed the data as listed     Latest Ref Rng & Units 04/28/2024    8:49 AM 03/31/2024    8:57 AM 03/11/2024   10:29 AM  CBC EXTENDED  WBC 4.0 - 10.5 K/uL 6.1  6.5  3.9   RBC 4.22 - 5.81 MIL/uL 4.16  2.99  3.30   Hemoglobin 13.0 - 17.0 g/dL 86.5  9.3  89.6   HCT 60.9 - 52.0 % 40.7  29.1  32.2   Platelets 150 - 400 K/uL 145  204  137   NEUT# 1.7 - 7.7 K/uL 4.8  4.3  3.0   Lymph# 0.7 - 4.0 K/uL 0.3  0.9  0.2         Latest Ref Rng & Units 04/28/2024    8:49 AM 03/31/2024    8:57 AM 03/11/2024   10:29 AM  CMP  Glucose 70 - 99 mg/dL 82  899  868   BUN 8 - 23 mg/dL 14  16  16    Creatinine 0.61 - 1.24 mg/dL 8.84  8.84  8.78   Sodium 135 - 145 mmol/L 139  137  133   Potassium 3.5 - 5.1 mmol/L 4.1  4.2  4.5   Chloride 98 - 111 mmol/L 105  103  102   CO2 22 - 32 mmol/L 26  23  23   Calcium 8.9 - 10.3 mg/dL 9.5  9.3  8.8   Total Protein 6.5 - 8.1 g/dL 7.1  7.1  7.2   Total Bilirubin 0.0 - 1.2 mg/dL 0.4  0.4  0.5   Alkaline Phos 38 - 126 U/L 87  101  101   AST 15 - 41 U/L 29  29  27    ALT 0 - 44 U/L 27  21  32     02/13/2024 FISH         Surgical Pathology 01/30/2024 CASE: [id number] PATIENT: Martie Hannis Bone Marrow Report  Reason for Addendum #1:  Immunohistochemistry results  Clinical History:  Lymphadenopathy     DIAGNOSIS:  BONE MARROW, ASPIRATE, CLOT, CORE: - Hypercellular bone marrow (80%) involved by CD5 positive mature B-cell lymphoma (approximately 40% of cellularity).  See comment.  PERIPHERAL BLOOD: - Normocytic anemia - Thrombocytopenia  COMMENT: The patient's history of lymphadenopathy is noted.  The findings on the bone marrow specimen are similar to those on the lymph node specimen i.e. involvement by the patient's lymphoma to approximately 40% of bone marrow cellularity is observed.  Flow cytometric analysis performed on the bone marrow specimen identified a clonal B-cell population constituting 8% of all lymphocytes.  The cells are positive for CD5, CD19, CD20, CD38, and express kappa light chains.  Additional immunohistochemical stains CD23 and cyclin D1 are pending and will be reported in an addendum.  Correlation with pending cytogenetics, low-grade B-cell lymphoma FISH panel and SOX11 IHC is recommended for further assessment.  MICROSCOPIC DESCRIPTION:  PERIPHERAL BLOOD SMEAR: Platelets: Decreased, no platelet clumps identified Erythroid: Anisopoikilocytosis Leukocytes: Adequate, negative for dysplastic granulocytes or blasts  BONE MARROW ASPIRATE: Cellular Erythroid precursors: Erythroid precursors show a full sequence of maturation Granulocytic precursors: Granulocytic precursors show a full sequence of maturation Megakaryocytes: Typical in number and morphology Lymphocytes/plasma cells: Mildly increased plasma cells  TOUCH PREPARATIONS: Cellular, confirmatory of aspirate findings  CLOT AND BIOPSY: The bone marrow clot shows predominantly blood and scattered marrow for evaluation.  The bone marrow biopsy reveals a hypercellular bone marrow (80%) with lymphoid aggregates comprised of medium sized atypical cells with convoluted nuclear membranes and prominent nucleoli.  The lymphoid aggregates constitute approximately 40% of bone marrow  cellularity.  Background trilineage hematopoiesis is present.  SPECIAL STAINS: CD3: CD3 highlights T lymphocytes and the lymphoid aggregates CD20: CD20 highlights atypical B lymphocytes in the lymphoid aggregates CD5: CD5 highlights the atypical B lymphocytes in the lymphoid aggregates, as well as T lymphocytes Bcl-2: Bcl-2 highlights atypical B lymphocytes as well as T lymphocytes BCL6: BCL6 is negative CD138: CD138 highlights plasma cells MUM1: MUM1 highlights plasma cells Kappa/lambda ISH: Kappa/lambda ISH are polyclonal and the plasma cells Ki-67: Ki-67 appears mildly elevated to approximately 20%  IRON STAIN: Iron stains are performed on a bone marrow aspirate or touch imprint smear and section of clot. The controls stained appropriately.       Storage Iron: Scant      Ring Sideroblasts: Not identified  ADDITIONAL DATA/TESTING: Cytogenetics   CELL COUNT DATA:  Bone Marrow count performed on 500 cells shows: Blasts:   5%   Myeloid:  58% Promyelocytes: 1%   Erythroid:     25% Myelocytes:    9%   Lymphocytes:   5% Metamyelocytes:     4%   Plasma cells:  11% Bands:    8% Neutrophils:   25%  M:E ratio:     2.32 Eosinophils:   6% Basophils:  0% Monocytes:     1%  Lab Data: CBC performed on 01/30/2024 shows: WBC: 8.7 k/uL  Neutrophils:   53% Hgb: 9.8 g/dL  Lymphocytes:   72% HCT: 31.8 %    Monocytes:     18% MCV: 83.7 fL   Eosinophils:   1% RDW: 17.3 %    Basophils:     1% PLT: 104 k/uL    GROSS DESCRIPTION:  A: Aspirate smear  B: The specimen is received in B plus fixative, and consists of a 12.0 x 7.0 x 2.0 mm aggregate of red-brown clotted blood.  The specimen is entirely submitted in 1 cassette.  C: The specimen is received in B plus fixative, and consists of 2 cores of red-brown bone, measuring 0.7 and 0.9 cm in length by 0.2 cm in diameter.  The specimen is entirely submitted in 1 cassette following decalcification with Immunocal.  (KL 01/30/2024)     ADDENDUM:   - Cyclin D1 and CD23 stains are negative in the lymphoid aggregates.  Controls are adequate.    ADDENDUM: - Immunohistochemical stains SOX11 performed at [facility] is positive in the neoplastic cells.  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       02/03/2024 Surgical Pathology CASE: [id number] PATIENT: ROSALYNN PIETY Flow Pathology Report     Clinical history: Lymphadenopathy     DIAGNOSIS:  - Abnormal clonal B-cell population identified.  See comment.  COMMENT: Flow cytometric analysis identified an abnormal clonal B-cell population constituting 56% of the lymphocytes.  The cells are positive for CD5, CD19, CD20, CD38 and express kappa light chains.  The findings are consistent with involvement by a CD5 positive mature B-cell lymphoma. Please refer to case [id number] for additional details.   GATING AND PHENOTYPIC ANALYSIS:  Gated population: Flow cytometric immunophenotyping is performed using antibodies to the antigens listed in the table below. Electronic gates are placed around a cell cluster displaying light scatter properties corresponding to: lymphocytes  Abnormal Cells in gated population: 56%  Phenotype of Abnormal Cells: CD5, CD19, CD20, CD38, Kappa                      Lymphoid Antigens       Myeloid Antigens Miscellaneous CD2  NEG  CD10 NEG  CD11b     ND   CD45 POS CD3  NEG  CD19 POS  CD11c     ND   HLA-Dr    ND CD4  NEG  CD20 POS  CD13 ND   CD34 NEG CD5  POS  CD22 ND   CD14 ND   CD38 NEG CD7  NEG  CD79b     ND   CD15 ND   CD138     ND CD8  NEG  CD103     ND   CD16 ND   TdT  ND CD25 ND   CD200     NEG  CD33 ND   CD123     ND TCRab     ND   sKappa    POS  CD64 ND   CD41 ND TCRgd     NEG  sLambda   NEG  CD117     ND   CD61 ND CD56 NEG  cKappa    ND   MPO  ND   CD71 ND CD57 ND   cLambda   ND        CD235aND      GROSS DESCRIPTION:  Reference Tissue case [id number].    01/30/2024  SURGICAL PATHOLOGY CASE: [id number] PATIENT: ROSALYNN PIETY Surgical Pathology Report     Clinical History: Possible lymphoma, lymphadenopathy, 1.6 cm (las)     FINAL MICROSCOPIC DIAGNOSIS:  A. LYMPH NODE, LEFT SUPRACLAVICULAR, BIOPSY: - CD5 positive mature B-cell lymphoma. See Comment.  COMMENT: - Morphologic evaluation of the needle core biopsy reveals a lymph node with effaced architecture comprised predominantly of medium sized lymphocytes intermixed with larger, more atypical cells. Immunohistochemically the neoplastic cells are positive for CD20, Bcl-2 and show CD5 coexpression.  The cells are negative for CD23.  Cyclin D1 staining is equivocal.  The Ki-67 proliferation index appears somewhat elevated at 30%.  CD10 and BCL6 highlight residual germinal centers. Flow cytometric analysis revealed a clonal B-cell population constituting 56% of the lymphocytes.  The lymphocytes are positive for CD5, CD19, CD20, CD38 and show kappa light chain restriction.  Overall, the findings are consistent with involvement by a B-cell lymphoma. Diagnostic considerations include atypical chronic lymphocytic leukemia/small lymphocytic lymphoma and mantle cell lymphoma.  For further characterization, a low-grade B-cell lymphoma FISH panel and SOX11 immunohistochemical stain will be performed and reported separately.  GROSS DESCRIPTION:  Specimen is received fresh and consists of multiple core and core fragments of tan-white soft tissue, ranging from 0.1 cm to 1.4 cm in length by 0.1 cm in diameter.  Representative portion of tissue is placed in RPMI for possible flow cytometry, the remaining tissue is entirely submitted in 1 cassette.  SHIRLEEN 01/30/2024)      02/09/2024 Cytogenetics     RADIOGRAPHIC STUDIES: I have personally reviewed the radiological images as listed and agreed with the findings in the report. CT US  GUIDED BIOPSY Result Date: 01/30/2024 INDICATION: Diffuse  lymphadenopathy and splenomegaly. Inconclusive pathology after excisional biopsy of a left axillary lymph node. The patient is at or ultrasound-guided biopsy of a hypermetabolic left supraclavicular lymph node. EXAM: ULTRASOUND GUIDED CORE BIOPSY OF LEFT SUPRACLAVICULAR LYMPH NODE MEDICATIONS: None. ANESTHESIA/SEDATION: The procedure immediately followed a bone marrow biopsy or which the patient received 1.5 mg IV versed  and 75 mcg IV fentanyl . PROCEDURE: The procedure, risks, benefits, and alternatives were explained to the patient. Questions regarding the procedure were encouraged and answered. The patient understands and consents to the procedure. A time out was performed prior to initiating the procedure. The left neck was prepped with chlorhexidine  in a sterile fashion, and a sterile drape was applied covering the operative field. A sterile gown and sterile gloves were used for the procedure. Local anesthesia was provided with 1% Lidocaine . After localizing an enlarged left supraclavicular lymph node of, 18 gauge core biopsy device was utilized in obtaining 4 separate core biopsy samples. Samples were submitted in saline. Additional ultrasound was performed. COMPLICATIONS: None immediate. FINDINGS: An enlarged hypoechoic lymph node in the left supraclavicular region posterior to the internal jugular vein and corresponding to a hypermetabolic lymph node seen by prior PET scan measures approximately 1.6 x 1.2 x 1.0 cm by ultrasound. Solid core biopsy samples were obtained. IMPRESSION: Ultrasound-guided core biopsy performed of an enlarged left supraclavicular lymph node measuring up to 1.6 cm by ultrasound. Electronically Signed   By: Marcey Moan M.D.   On: 01/30/2024 11:28   CT BONE MARROW BIOPSY & ASPIRATION Result Date: 01/30/2024 CLINICAL DATA:  Extensive lymphadenopathy and massive splenomegaly. Concern for lymphoma versus other lymphoproliferative process. Need for bone marrow biopsy. EXAM: CT GUIDED  BONE MARROW ASPIRATION AND BIOPSY ANESTHESIA/SEDATION: Moderate (conscious) sedation was employed during this  procedure. A total of Versed  1.5 mg and Fentanyl  75 mcg was administered intravenously. Moderate Sedation Time: 40 minutes which included bone marrow biopsy and under ultrasound guided lymph node biopsy. The patient's level of consciousness and vital signs were monitored continuously by radiology nursing throughout the procedure under my direct supervision. PROCEDURE: The procedure risks, benefits, and alternatives were explained to the patient. Questions regarding the procedure were encouraged and answered. The patient understands and consents to the procedure. A time out was performed prior to initiating the procedure. The right gluteal region was prepped with chlorhexidine . Sterile gown and sterile gloves were used for the procedure. Local anesthesia was provided with 1% Lidocaine . Under CT guidance, an 11 gauge On Control bone cutting needle was advanced from a posterior approach into the right iliac bone. Needle positioning was confirmed with CT. Initial non heparinized and heparinized aspirate samples were obtained of bone marrow. Core biopsy was performed via the On Control drill needle. COMPLICATIONS: None FINDINGS: Inspection of initial aspirate did reveal visible particles. Intact core biopsy sample was obtained. IMPRESSION: CT guided bone marrow biopsy of right posterior iliac bone with both aspirate and core samples obtained. Electronically Signed   By: Marcey Moan M.D.   On: 01/30/2024 11:24    ASSESSMENT & PLAN:  84 y.o. 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 - FISH TP53 wild type.   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  PLAN: - Discussed lab results on 04/28/2024 in detail with patient: - CMP   - Creatinine:  1.15 - Calcium: 9.5 - CBC  - Hemoglobin: 13.4  - WBC: 6.1  - Platelets: 145 - PET CT  scan in 2 to 3 weeks - Please schedule cycle 4 and cycle 5 of Bendamustine  rituximab  as per integrated scheduling with labs and MD visits.  FOLLOW-UP in 4 weeks for labs and follow-up with Dr. Onesimo.  The total time spent in the appointment was *** minutes* .  All of the patient's questions were answered and the patient knows to call the clinic with any problems, questions, or concerns.  Emaline Onesimo MD MS AAHIVMS [facility] Hematology/Oncology Physician [facility]  *Total Encounter Time as defined by the Centers for Medicare and Medicaid Services includes, in addition to the face-to-face time of a patient visit (documented in the note above) non-face-to-face time: obtaining and reviewing outside history, ordering and reviewing medications, tests or procedures, care coordination (communications with other health care professionals or caregivers) and documentation in the medical record.  I, Marijo Sharps, acting as a neurosurgeon for Emaline Onesimo, MD.,have documented all relevant documentation on the behalf of Emaline Onesimo, MD,as directed by  Emaline Onesimo, MD while in the presence of Emaline Onesimo, MD.  I have reviewed the above documentation for accuracy and completeness, and I agree with the above.  Emaline Onesimo, MD      [1]  Social History Tobacco Use   Smoking status: Former    Current packs/day: 0.00    Types: Cigarettes    Quit date: 06/05/1998    Years since quitting: 25.9   Smokeless tobacco: Never  Vaping Use   Vaping status: Never Used  Substance Use Topics   Alcohol  use: No   Drug use: No   "

## 2024-04-29 ENCOUNTER — Other Ambulatory Visit: Payer: Self-pay

## 2024-04-29 ENCOUNTER — Inpatient Hospital Stay

## 2024-04-29 VITALS — BP 103/50 | HR 52 | Temp 97.7°F | Resp 16

## 2024-04-29 DIAGNOSIS — C8318 Mantle cell lymphoma, lymph nodes of multiple sites: Secondary | ICD-10-CM

## 2024-04-29 MED ORDER — SODIUM CHLORIDE 0.9 % IV SOLN: INTRAVENOUS | Status: DC

## 2024-04-29 MED ORDER — DEXAMETHASONE SOD PHOSPHATE PF 10 MG/ML IJ SOLN
10.0000 mg | Freq: Once | INTRAMUSCULAR | Status: AC
  Administered 2024-04-29: 10 mg via INTRAVENOUS
  Filled 2024-04-29: qty 1

## 2024-04-29 MED ORDER — SODIUM CHLORIDE 0.9 % IV SOLN
70.0000 mg/m2 | Freq: Once | INTRAVENOUS | Status: AC
  Administered 2024-04-29: 125 mg via INTRAVENOUS
  Filled 2024-04-29: qty 5

## 2024-04-29 NOTE — Patient Instructions (Signed)
 [facility] - A DEPT OF [facility]  Discharge Instructions: Thank you for choosing [facility] to provide your oncology and hematology care.   If you have a lab appointment with the Cancer Center, please go directly to the Cancer Center and check in at the registration area.   Wear comfortable clothing and clothing appropriate for easy access to any Portacath or PICC line.   We strive to give you quality time with your provider. You may need to reschedule your appointment if you arrive late (15 or more minutes).  Arriving late affects you and other patients whose appointments are after yours.  Also, if you miss three or more appointments without notifying the office, you may be dismissed from the clinic at the provider's discretion.      For prescription refill requests, have your pharmacy contact our office and allow 72 hours for refills to be completed.    Today you received the following chemotherapy and/or immunotherapy agents Bendamustine      To help prevent nausea and vomiting after your treatment, we encourage you to take your nausea medication as directed.  BELOW ARE SYMPTOMS THAT SHOULD BE REPORTED IMMEDIATELY: *FEVER GREATER THAN 100.4 F (38 C) OR HIGHER *CHILLS OR SWEATING *NAUSEA AND VOMITING THAT IS NOT CONTROLLED WITH YOUR NAUSEA MEDICATION *UNUSUAL SHORTNESS OF BREATH *UNUSUAL BRUISING OR BLEEDING *URINARY PROBLEMS (pain or burning when urinating, or frequent urination) *BOWEL PROBLEMS (unusual diarrhea, constipation, pain near the anus) TENDERNESS IN MOUTH AND THROAT WITH OR WITHOUT PRESENCE OF ULCERS (sore throat, sores in mouth, or a toothache) UNUSUAL RASH, SWELLING OR PAIN  UNUSUAL VAGINAL DISCHARGE OR ITCHING   Items with * indicate a potential emergency and should be followed up as soon as possible or go to the Emergency Department if any problems should occur.  Please show the CHEMOTHERAPY ALERT CARD or  IMMUNOTHERAPY ALERT CARD at check-in to the Emergency Department and triage nurse.  Should you have questions after your visit or need to cancel or reschedule your appointment, please contact [facility]  Dept: [phone]  and follow the prompts.  Office hours are 8:00 a.m. to 4:30 p.m. Monday - Friday. Please note that voicemails left after 4:00 p.m. may not be returned until the following business day.  We are closed weekends and major holidays. You have access to a nurse at all times for urgent questions. Please call the main number to the clinic Dept: [phone] and follow the prompts.   For any non-urgent questions, you may also contact your provider using MyChart. We now offer e-Visits for anyone 55 and older to request care online for non-urgent symptoms. For details visit [email or web address].   Also download the MyChart app! Go to the app store, search "MyChart", open the app, select [place], and log in with your MyChart username and password.

## 2024-04-30 ENCOUNTER — Other Ambulatory Visit: Payer: Self-pay | Admitting: Physician Assistant

## 2024-04-30 DIAGNOSIS — C8318 Mantle cell lymphoma, lymph nodes of multiple sites: Secondary | ICD-10-CM

## 2024-05-12 ENCOUNTER — Ambulatory Visit

## 2024-05-14 ENCOUNTER — Encounter (HOSPITAL_COMMUNITY)

## 2024-05-26 ENCOUNTER — Inpatient Hospital Stay: Admitting: Hematology

## 2024-05-26 ENCOUNTER — Inpatient Hospital Stay: Attending: Physician Assistant

## 2024-05-26 ENCOUNTER — Inpatient Hospital Stay

## 2024-05-27 ENCOUNTER — Inpatient Hospital Stay
